# Patient Record
Sex: Female | Born: 1940
Health system: Southern US, Community
[De-identification: ages and names within clinical notes are randomized; demographics above are authoritative.]

## PROBLEM LIST (undated history)

## (undated) DIAGNOSIS — J449 Chronic obstructive pulmonary disease, unspecified: Secondary | ICD-10-CM

## (undated) DIAGNOSIS — E039 Hypothyroidism, unspecified: Secondary | ICD-10-CM

## (undated) DIAGNOSIS — E785 Hyperlipidemia, unspecified: Secondary | ICD-10-CM

## (undated) DIAGNOSIS — I639 Cerebral infarction, unspecified: Secondary | ICD-10-CM

## (undated) DIAGNOSIS — K219 Gastro-esophageal reflux disease without esophagitis: Secondary | ICD-10-CM

## (undated) DIAGNOSIS — C4490 Unspecified malignant neoplasm of skin, unspecified: Secondary | ICD-10-CM

## (undated) DIAGNOSIS — M1990 Unspecified osteoarthritis, unspecified site: Secondary | ICD-10-CM

## (undated) DIAGNOSIS — I10 Essential (primary) hypertension: Secondary | ICD-10-CM

## (undated) DIAGNOSIS — G4700 Insomnia, unspecified: Secondary | ICD-10-CM

## (undated) DIAGNOSIS — F419 Anxiety disorder, unspecified: Secondary | ICD-10-CM

## (undated) DIAGNOSIS — F439 Reaction to severe stress, unspecified: Secondary | ICD-10-CM

## (undated) DIAGNOSIS — R296 Repeated falls: Secondary | ICD-10-CM

## (undated) DIAGNOSIS — IMO0001 Reserved for inherently not codable concepts without codable children: Secondary | ICD-10-CM

## (undated) HISTORY — DX: Gastro-esophageal reflux disease without esophagitis: K21.9

## (undated) HISTORY — PX: TOE SURGERY: SHX1073

## (undated) HISTORY — PX: COLONOSCOPY: SHX174

## (undated) HISTORY — DX: Cerebral infarction, unspecified: I63.9

## (undated) HISTORY — DX: Unspecified osteoarthritis, unspecified site: M19.90

## (undated) HISTORY — DX: Insomnia, unspecified: G47.00

## (undated) HISTORY — DX: Anxiety disorder, unspecified: F41.9

## (undated) HISTORY — DX: Hyperlipidemia, unspecified: E78.5

## (undated) HISTORY — PX: MANDIBLE FRACTURE SURGERY: SHX706

## (undated) HISTORY — DX: Chronic obstructive pulmonary disease, unspecified: J44.9

## (undated) HISTORY — DX: Reserved for inherently not codable concepts without codable children: IMO0001

## (undated) HISTORY — DX: Essential (primary) hypertension: I10

## (undated) HISTORY — DX: Repeated falls: R29.6

## (undated) HISTORY — DX: Reaction to severe stress, unspecified: F43.9

## (undated) HISTORY — DX: Unspecified malignant neoplasm of skin, unspecified: C44.90

## (undated) HISTORY — PX: SKIN CANCER EXCISION: SHX779

## (undated) HISTORY — DX: Hypothyroidism, unspecified: E03.9

---

## 2010-09-26 ENCOUNTER — Encounter: Admission: RE | Admit: 2010-09-26 | Discharge: 2010-09-26 | Payer: Self-pay | Admitting: Internal Medicine

## 2010-11-15 ENCOUNTER — Emergency Department (HOSPITAL_COMMUNITY): Admission: EM | Admit: 2010-11-15 | Discharge: 2010-11-16 | Payer: Self-pay | Admitting: Emergency Medicine

## 2010-12-05 ENCOUNTER — Ambulatory Visit (HOSPITAL_COMMUNITY)
Admission: RE | Admit: 2010-12-05 | Discharge: 2010-12-05 | Payer: Self-pay | Source: Home / Self Care | Admitting: Psychiatry

## 2010-12-10 ENCOUNTER — Other Ambulatory Visit (HOSPITAL_COMMUNITY)
Admission: RE | Admit: 2010-12-10 | Discharge: 2011-01-10 | Payer: Self-pay | Source: Home / Self Care | Attending: Psychiatry | Admitting: Psychiatry

## 2011-03-11 LAB — URINE DRUGS OF ABUSE SCREEN W ALC, ROUTINE (REF LAB)
Amphetamine Screen, Ur: NEGATIVE
Barbiturate Quant, Ur: NEGATIVE
Cocaine Metabolites: NEGATIVE
Creatinine,U: 110.2 mg/dL
Ethyl Alcohol: <10 mg/dL (ref <10)
Methadone: NEGATIVE
Phencyclidine (PCP): NEGATIVE

## 2011-03-12 LAB — URINE DRUGS OF ABUSE SCREEN W ALC, ROUTINE (REF LAB)
Amphetamine Screen, Ur: NEGATIVE
Cocaine Metabolites: NEGATIVE
Creatinine,U: 134.1 mg/dL
Ethyl Alcohol: <10 mg/dL (ref <10)
Methadone: NEGATIVE
Phencyclidine (PCP): NEGATIVE

## 2011-03-25 ENCOUNTER — Other Ambulatory Visit: Payer: Self-pay | Admitting: Internal Medicine

## 2011-03-25 DIAGNOSIS — Z09 Encounter for follow-up examination after completed treatment for conditions other than malignant neoplasm: Secondary | ICD-10-CM

## 2011-04-10 ENCOUNTER — Ambulatory Visit
Admission: RE | Admit: 2011-04-10 | Discharge: 2011-04-10 | Disposition: A | Discharge status: Home / Self Care | Payer: Medicare Other | Source: Ambulatory Visit | Attending: Internal Medicine | Admitting: Internal Medicine

## 2011-04-10 DIAGNOSIS — Z09 Encounter for follow-up examination after completed treatment for conditions other than malignant neoplasm: Secondary | ICD-10-CM

## 2011-08-01 ENCOUNTER — Other Ambulatory Visit: Payer: Self-pay | Admitting: Internal Medicine

## 2011-08-01 DIAGNOSIS — K219 Gastro-esophageal reflux disease without esophagitis: Secondary | ICD-10-CM

## 2011-08-05 ENCOUNTER — Ambulatory Visit
Admission: RE | Admit: 2011-08-05 | Discharge: 2011-08-05 | Disposition: A | Discharge status: Home / Self Care | Payer: Medicare Other | Source: Ambulatory Visit | Attending: Internal Medicine | Admitting: Internal Medicine

## 2011-08-05 DIAGNOSIS — K219 Gastro-esophageal reflux disease without esophagitis: Secondary | ICD-10-CM

## 2011-11-14 ENCOUNTER — Other Ambulatory Visit: Payer: Self-pay | Admitting: Internal Medicine

## 2011-11-14 DIAGNOSIS — N63 Unspecified lump in unspecified breast: Secondary | ICD-10-CM

## 2013-04-20 ENCOUNTER — Other Ambulatory Visit: Payer: Self-pay | Admitting: Internal Medicine

## 2013-04-20 DIAGNOSIS — N63 Unspecified lump in unspecified breast: Secondary | ICD-10-CM

## 2013-04-20 DIAGNOSIS — E2839 Other primary ovarian failure: Secondary | ICD-10-CM

## 2013-05-03 ENCOUNTER — Other Ambulatory Visit: Payer: Self-pay | Admitting: Gastroenterology

## 2013-05-03 DIAGNOSIS — R1310 Dysphagia, unspecified: Secondary | ICD-10-CM

## 2013-05-13 ENCOUNTER — Other Ambulatory Visit: Payer: Medicare Other

## 2013-05-18 ENCOUNTER — Ambulatory Visit
Admission: RE | Admit: 2013-05-18 | Discharge: 2013-05-18 | Disposition: A | Discharge status: Home / Self Care | Payer: Medicare Other | Source: Ambulatory Visit | Attending: Gastroenterology | Admitting: Gastroenterology

## 2013-05-18 DIAGNOSIS — R1310 Dysphagia, unspecified: Secondary | ICD-10-CM

## 2014-01-11 DIAGNOSIS — Z9109 Other allergy status, other than to drugs and biological substances: Secondary | ICD-10-CM | POA: Insufficient documentation

## 2015-01-20 DIAGNOSIS — G4700 Insomnia, unspecified: Secondary | ICD-10-CM | POA: Diagnosis not present

## 2015-01-20 DIAGNOSIS — R079 Chest pain, unspecified: Secondary | ICD-10-CM | POA: Diagnosis not present

## 2015-01-20 DIAGNOSIS — J441 Chronic obstructive pulmonary disease with (acute) exacerbation: Secondary | ICD-10-CM | POA: Diagnosis not present

## 2015-01-20 DIAGNOSIS — R05 Cough: Secondary | ICD-10-CM | POA: Diagnosis not present

## 2015-01-20 DIAGNOSIS — E039 Hypothyroidism, unspecified: Secondary | ICD-10-CM | POA: Diagnosis not present

## 2015-01-20 DIAGNOSIS — K449 Diaphragmatic hernia without obstruction or gangrene: Secondary | ICD-10-CM | POA: Diagnosis not present

## 2015-01-20 DIAGNOSIS — E785 Hyperlipidemia, unspecified: Secondary | ICD-10-CM | POA: Diagnosis not present

## 2015-01-20 DIAGNOSIS — M153 Secondary multiple arthritis: Secondary | ICD-10-CM | POA: Diagnosis not present

## 2015-01-20 DIAGNOSIS — F419 Anxiety disorder, unspecified: Secondary | ICD-10-CM | POA: Diagnosis not present

## 2015-01-20 DIAGNOSIS — K219 Gastro-esophageal reflux disease without esophagitis: Secondary | ICD-10-CM | POA: Diagnosis not present

## 2015-01-20 DIAGNOSIS — I10 Essential (primary) hypertension: Secondary | ICD-10-CM | POA: Diagnosis not present

## 2015-04-18 DIAGNOSIS — J018 Other acute sinusitis: Secondary | ICD-10-CM | POA: Diagnosis not present

## 2015-04-18 DIAGNOSIS — J302 Other seasonal allergic rhinitis: Secondary | ICD-10-CM | POA: Diagnosis not present

## 2015-08-10 DIAGNOSIS — E785 Hyperlipidemia, unspecified: Secondary | ICD-10-CM | POA: Diagnosis not present

## 2015-08-10 DIAGNOSIS — M1990 Unspecified osteoarthritis, unspecified site: Secondary | ICD-10-CM | POA: Diagnosis not present

## 2015-08-10 DIAGNOSIS — I10 Essential (primary) hypertension: Secondary | ICD-10-CM | POA: Diagnosis not present

## 2015-08-10 DIAGNOSIS — M797 Fibromyalgia: Secondary | ICD-10-CM | POA: Diagnosis not present

## 2015-08-10 DIAGNOSIS — E039 Hypothyroidism, unspecified: Secondary | ICD-10-CM | POA: Diagnosis not present

## 2015-08-10 DIAGNOSIS — M5412 Radiculopathy, cervical region: Secondary | ICD-10-CM | POA: Diagnosis not present

## 2015-08-10 DIAGNOSIS — R42 Dizziness and giddiness: Secondary | ICD-10-CM | POA: Diagnosis not present

## 2015-08-17 DIAGNOSIS — R2681 Unsteadiness on feet: Secondary | ICD-10-CM | POA: Diagnosis not present

## 2015-08-17 DIAGNOSIS — R42 Dizziness and giddiness: Secondary | ICD-10-CM | POA: Diagnosis not present

## 2015-08-17 DIAGNOSIS — R269 Unspecified abnormalities of gait and mobility: Secondary | ICD-10-CM | POA: Diagnosis not present

## 2015-08-31 DIAGNOSIS — I6523 Occlusion and stenosis of bilateral carotid arteries: Secondary | ICD-10-CM | POA: Diagnosis not present

## 2015-08-31 DIAGNOSIS — R2681 Unsteadiness on feet: Secondary | ICD-10-CM | POA: Diagnosis not present

## 2015-08-31 DIAGNOSIS — R42 Dizziness and giddiness: Secondary | ICD-10-CM | POA: Diagnosis not present

## 2015-10-21 DIAGNOSIS — Z23 Encounter for immunization: Secondary | ICD-10-CM | POA: Diagnosis not present

## 2015-11-16 DIAGNOSIS — F3289 Other specified depressive episodes: Secondary | ICD-10-CM | POA: Diagnosis not present

## 2015-11-16 DIAGNOSIS — M5412 Radiculopathy, cervical region: Secondary | ICD-10-CM | POA: Diagnosis not present

## 2015-11-16 DIAGNOSIS — K219 Gastro-esophageal reflux disease without esophagitis: Secondary | ICD-10-CM | POA: Diagnosis not present

## 2015-11-16 DIAGNOSIS — M1990 Unspecified osteoarthritis, unspecified site: Secondary | ICD-10-CM | POA: Diagnosis not present

## 2015-11-16 DIAGNOSIS — J209 Acute bronchitis, unspecified: Secondary | ICD-10-CM | POA: Diagnosis not present

## 2015-11-16 DIAGNOSIS — I10 Essential (primary) hypertension: Secondary | ICD-10-CM | POA: Diagnosis not present

## 2015-11-16 DIAGNOSIS — L299 Pruritus, unspecified: Secondary | ICD-10-CM | POA: Diagnosis not present

## 2015-11-16 DIAGNOSIS — M797 Fibromyalgia: Secondary | ICD-10-CM | POA: Diagnosis not present

## 2015-11-16 DIAGNOSIS — F419 Anxiety disorder, unspecified: Secondary | ICD-10-CM | POA: Diagnosis not present

## 2016-01-01 DIAGNOSIS — I6523 Occlusion and stenosis of bilateral carotid arteries: Secondary | ICD-10-CM | POA: Diagnosis not present

## 2016-01-01 DIAGNOSIS — M6281 Muscle weakness (generalized): Secondary | ICD-10-CM | POA: Diagnosis not present

## 2016-01-01 DIAGNOSIS — Z89421 Acquired absence of other right toe(s): Secondary | ICD-10-CM | POA: Diagnosis not present

## 2016-01-01 DIAGNOSIS — G4700 Insomnia, unspecified: Secondary | ICD-10-CM | POA: Diagnosis not present

## 2016-01-01 DIAGNOSIS — I119 Hypertensive heart disease without heart failure: Secondary | ICD-10-CM | POA: Diagnosis not present

## 2016-01-01 DIAGNOSIS — S42191S Fracture of other part of scapula, right shoulder, sequela: Secondary | ICD-10-CM | POA: Diagnosis not present

## 2016-01-01 DIAGNOSIS — E039 Hypothyroidism, unspecified: Secondary | ICD-10-CM | POA: Diagnosis not present

## 2016-01-01 DIAGNOSIS — I63511 Cerebral infarction due to unspecified occlusion or stenosis of right middle cerebral artery: Secondary | ICD-10-CM | POA: Diagnosis not present

## 2016-01-01 DIAGNOSIS — R41841 Cognitive communication deficit: Secondary | ICD-10-CM | POA: Diagnosis not present

## 2016-01-01 DIAGNOSIS — R296 Repeated falls: Secondary | ICD-10-CM | POA: Insufficient documentation

## 2016-01-01 DIAGNOSIS — E785 Hyperlipidemia, unspecified: Secondary | ICD-10-CM | POA: Diagnosis not present

## 2016-01-01 DIAGNOSIS — J449 Chronic obstructive pulmonary disease, unspecified: Secondary | ICD-10-CM | POA: Diagnosis present

## 2016-01-01 DIAGNOSIS — K219 Gastro-esophageal reflux disease without esophagitis: Secondary | ICD-10-CM | POA: Diagnosis not present

## 2016-01-01 DIAGNOSIS — M25511 Pain in right shoulder: Secondary | ICD-10-CM | POA: Diagnosis not present

## 2016-01-01 DIAGNOSIS — I081 Rheumatic disorders of both mitral and tricuspid valves: Secondary | ICD-10-CM | POA: Diagnosis not present

## 2016-01-01 DIAGNOSIS — R262 Difficulty in walking, not elsewhere classified: Secondary | ICD-10-CM | POA: Diagnosis not present

## 2016-01-01 DIAGNOSIS — R278 Other lack of coordination: Secondary | ICD-10-CM | POA: Diagnosis not present

## 2016-01-01 DIAGNOSIS — F17210 Nicotine dependence, cigarettes, uncomplicated: Secondary | ICD-10-CM | POA: Diagnosis present

## 2016-01-01 DIAGNOSIS — Z85828 Personal history of other malignant neoplasm of skin: Secondary | ICD-10-CM | POA: Diagnosis not present

## 2016-01-01 DIAGNOSIS — M62838 Other muscle spasm: Secondary | ICD-10-CM | POA: Diagnosis not present

## 2016-01-01 DIAGNOSIS — I10 Essential (primary) hypertension: Secondary | ICD-10-CM | POA: Diagnosis not present

## 2016-01-01 DIAGNOSIS — R531 Weakness: Secondary | ICD-10-CM | POA: Diagnosis not present

## 2016-01-01 DIAGNOSIS — S199XXA Unspecified injury of neck, initial encounter: Secondary | ICD-10-CM | POA: Diagnosis not present

## 2016-01-01 DIAGNOSIS — G8194 Hemiplegia, unspecified affecting left nondominant side: Secondary | ICD-10-CM | POA: Diagnosis present

## 2016-01-01 DIAGNOSIS — M1990 Unspecified osteoarthritis, unspecified site: Secondary | ICD-10-CM | POA: Diagnosis present

## 2016-01-01 DIAGNOSIS — I639 Cerebral infarction, unspecified: Secondary | ICD-10-CM | POA: Diagnosis not present

## 2016-01-01 DIAGNOSIS — R29810 Facial weakness: Secondary | ICD-10-CM | POA: Diagnosis present

## 2016-01-01 LAB — BASIC METABOLIC PANEL
BUN: 14 mg/dL (ref 4–21)
CREATININE: 0.7 mg/dL (ref 0.5–1.1)
Glucose: 157 mg/dL
Potassium: 3.6 mmol/L (ref 3.4–5.3)
SODIUM: 138 mmol/L (ref 137–147)

## 2016-01-01 LAB — CBC AND DIFFERENTIAL
HCT: 42 % (ref 36–46)
Hemoglobin: 13.6 g/dL (ref 12.0–16.0)
Platelets: 202 10*3/uL (ref 150–399)
WBC: 9.3 10^3/mL

## 2016-01-02 LAB — HEPATIC FUNCTION PANEL
ALK PHOS: 91 U/L (ref 25–125)
ALT: 9 U/L (ref 7–35)
AST: 16 U/L (ref 13–35)
Bilirubin, Total: 0.6 mg/dL

## 2016-01-02 LAB — CBC AND DIFFERENTIAL
HCT: 43 % (ref 36–46)
HEMOGLOBIN: 13.4 g/dL (ref 12.0–16.0)
PLATELETS: 195 10*3/uL (ref 150–399)
WBC: 7.4 10*3/mL

## 2016-01-02 LAB — BASIC METABOLIC PANEL
BUN: 12 mg/dL (ref 4–21)
CREATININE: 0.6 mg/dL (ref 0.5–1.1)
GLUCOSE: 108 mg/dL
POTASSIUM: 3.3 mmol/L — AB (ref 3.4–5.3)
Sodium: 139 mmol/L (ref 137–147)

## 2016-01-02 LAB — LIPID PANEL
Cholesterol: 279 mg/dL — AB (ref 0–200)
HDL: 49 mg/dL (ref 35–70)
LDL Cholesterol: 201 mg/dL
Triglycerides: 149 mg/dL (ref 40–160)

## 2016-01-02 LAB — HEMOGLOBIN A1C: HEMOGLOBIN A1C: 5.1

## 2016-01-04 DIAGNOSIS — I10 Essential (primary) hypertension: Secondary | ICD-10-CM | POA: Diagnosis not present

## 2016-01-04 DIAGNOSIS — K219 Gastro-esophageal reflux disease without esophagitis: Secondary | ICD-10-CM | POA: Diagnosis not present

## 2016-01-04 DIAGNOSIS — R262 Difficulty in walking, not elsewhere classified: Secondary | ICD-10-CM | POA: Diagnosis not present

## 2016-01-04 DIAGNOSIS — I081 Rheumatic disorders of both mitral and tricuspid valves: Secondary | ICD-10-CM | POA: Diagnosis not present

## 2016-01-04 DIAGNOSIS — R296 Repeated falls: Secondary | ICD-10-CM | POA: Diagnosis not present

## 2016-01-04 DIAGNOSIS — M25511 Pain in right shoulder: Secondary | ICD-10-CM | POA: Diagnosis not present

## 2016-01-04 DIAGNOSIS — E039 Hypothyroidism, unspecified: Secondary | ICD-10-CM | POA: Diagnosis not present

## 2016-01-04 DIAGNOSIS — R278 Other lack of coordination: Secondary | ICD-10-CM | POA: Diagnosis not present

## 2016-01-04 DIAGNOSIS — E038 Other specified hypothyroidism: Secondary | ICD-10-CM | POA: Diagnosis not present

## 2016-01-04 DIAGNOSIS — I639 Cerebral infarction, unspecified: Secondary | ICD-10-CM | POA: Diagnosis not present

## 2016-01-04 DIAGNOSIS — R41841 Cognitive communication deficit: Secondary | ICD-10-CM | POA: Diagnosis not present

## 2016-01-04 DIAGNOSIS — R5381 Other malaise: Secondary | ICD-10-CM | POA: Diagnosis not present

## 2016-01-04 DIAGNOSIS — E785 Hyperlipidemia, unspecified: Secondary | ICD-10-CM | POA: Diagnosis not present

## 2016-01-04 DIAGNOSIS — M542 Cervicalgia: Secondary | ICD-10-CM | POA: Diagnosis not present

## 2016-01-04 DIAGNOSIS — K122 Cellulitis and abscess of mouth: Secondary | ICD-10-CM | POA: Diagnosis not present

## 2016-01-04 DIAGNOSIS — I6990 Unspecified sequelae of unspecified cerebrovascular disease: Secondary | ICD-10-CM | POA: Diagnosis not present

## 2016-01-04 DIAGNOSIS — F329 Major depressive disorder, single episode, unspecified: Secondary | ICD-10-CM | POA: Diagnosis not present

## 2016-01-04 DIAGNOSIS — R413 Other amnesia: Secondary | ICD-10-CM | POA: Diagnosis not present

## 2016-01-04 DIAGNOSIS — M62838 Other muscle spasm: Secondary | ICD-10-CM | POA: Diagnosis not present

## 2016-01-04 DIAGNOSIS — R269 Unspecified abnormalities of gait and mobility: Secondary | ICD-10-CM | POA: Diagnosis not present

## 2016-01-04 DIAGNOSIS — M6281 Muscle weakness (generalized): Secondary | ICD-10-CM | POA: Diagnosis not present

## 2016-01-04 DIAGNOSIS — I63511 Cerebral infarction due to unspecified occlusion or stenosis of right middle cerebral artery: Secondary | ICD-10-CM | POA: Diagnosis not present

## 2016-01-09 ENCOUNTER — Encounter: Payer: Self-pay | Admitting: Internal Medicine

## 2016-01-09 ENCOUNTER — Non-Acute Institutional Stay (SKILLED_NURSING_FACILITY): Payer: Medicare Other | Admitting: Internal Medicine

## 2016-01-09 DIAGNOSIS — E038 Other specified hypothyroidism: Secondary | ICD-10-CM

## 2016-01-09 DIAGNOSIS — M542 Cervicalgia: Secondary | ICD-10-CM | POA: Diagnosis not present

## 2016-01-09 DIAGNOSIS — F329 Major depressive disorder, single episode, unspecified: Secondary | ICD-10-CM | POA: Diagnosis not present

## 2016-01-09 DIAGNOSIS — K219 Gastro-esophageal reflux disease without esophagitis: Secondary | ICD-10-CM | POA: Diagnosis not present

## 2016-01-09 DIAGNOSIS — E785 Hyperlipidemia, unspecified: Secondary | ICD-10-CM | POA: Diagnosis not present

## 2016-01-09 DIAGNOSIS — I63511 Cerebral infarction due to unspecified occlusion or stenosis of right middle cerebral artery: Secondary | ICD-10-CM | POA: Diagnosis not present

## 2016-01-09 DIAGNOSIS — I10 Essential (primary) hypertension: Secondary | ICD-10-CM | POA: Diagnosis not present

## 2016-01-09 DIAGNOSIS — R5381 Other malaise: Secondary | ICD-10-CM | POA: Diagnosis not present

## 2016-01-09 NOTE — Progress Notes (Signed)
Patient ID: Kendra Hernandez, female   DOB: 04/25/1941, 75 y.o.   MRN: [id number]     [facility]  PCP: [name], MD  Code Status: Full Code   Allergies  Allergen Reactions  . Ceftin [Cefuroxime Axetil]   . Penicillins Hives    Hives and swelling as a child   . Simvastatin     Patient can not recall side effect     Chief Complaint  Patient presents with  . New Admit To SNF    New admission      HPI:  75 y.o. patient is here for short term rehabilitation post hospital admission from 01/01/16-01/04/16 with right MCA stroke with left sided weakness. She has PMH of HTN, HLD, hypothyroidism, insomnia among others. She is seen in her room today. She complaints of pain in her right lower neck and right shoulder area. No falls in the facility. She woke up with some scratchy throat this am. Denies any cough or runny nose. She gets tired easily and has low energy level. No other concerns.   Review of Systems:  Constitutional: Negative for fever, chills, diaphoresis.  HENT: Negative for headache, congestion, nasal discharge Eyes: Negative for blurred vision, double vision and discharge.  Respiratory: Negative for cough, shortness of breath and wheezing.   Cardiovascular: Negative for chest pain, palpitations, leg swelling.  Gastrointestinal: Negative for heartburn, nausea, vomiting, abdominal pain. Had bowel movement yesterday Genitourinary: Negative for dysuria and flank pain.  Musculoskeletal: Negative for back pain, falls Skin: Negative for itching, rash.  Neurological: Negative for dizziness, tingling Psychiatric/Behavioral: Negative for depression   Past Medical History  Diagnosis Date  . Stroke (cerebrum) ([place])   . Repeated falls   . Hypertension   . Hyperlipidemia   . Insomnia   . Hypothyroid   . Reflux   . Anxiety   . Stress   . Skin cancer   . Arthritis   . COPD (chronic obstructive pulmonary disease) [facility])    Past Surgical History  Procedure  Laterality Date  . Toe surgery Right     Removal of toe   . Mandible fracture surgery      Placement of partial jaw due to abcess  . Skin cancer excision      Removal of several skin cancers  . Colonoscopy      With polyp removal    Social History:   reports that she has been smoking.  She does not have any smokeless tobacco history on file. She reports that she drinks alcohol. She reports that she does not use illicit drugs.  Family History  Problem Relation Age of Onset  . Emphysema Brother   . Emphysema Mother   . Heart attack Father   . Rheumatologic disease Mother     Medications:   Medication List       This list is accurate as of: 01/09/16 11:07 AM.  Always use your most recent med list.               aspirin EC 81 MG tablet  Take 81 mg by mouth daily. With breakfast     atorvastatin 40 MG tablet  Commonly known as:  LIPITOR  Take 40 mg by mouth daily.     baclofen 10 MG tablet  Commonly known as:  LIORESAL  Take 10 mg by mouth 3 (three) times daily. Hold for sedation     fluticasone 50 MCG/ACT nasal spray  Commonly known as:  FLONASE  Place 2 sprays into both nostrils daily.     hydrALAZINE 100 MG tablet  Commonly known as:  APRESOLINE  Take 100 mg by mouth 2 (two) times daily. Hold for SBP <110     hydrOXYzine 25 MG tablet  Commonly known as:  ATARAX/VISTARIL  Take 25 mg by mouth every 8 (eight) hours as needed for anxiety.     labetalol 200 MG tablet  Commonly known as:  NORMODYNE  Take 200 mg by mouth 2 (two) times daily.     levothyroxine 50 MCG tablet  Commonly known as:  SYNTHROID, LEVOTHROID  Take 50 mcg by mouth daily before breakfast.     nicotine 14 mg/24hr patch  Commonly known as:  NICODERM CQ - dosed in mg/24 hours  Place 14 mg onto the skin daily.     omeprazole 40 MG capsule  Commonly known as:  PRILOSEC  Take 40 mg by mouth daily.     sucralfate 1 g tablet  Commonly known as:  CARAFATE  Take 1 g by mouth 4 (four) times  daily -  with meals and at bedtime.     traMADol 50 MG tablet  Commonly known as:  ULTRAM  Take 50 mg by mouth every 4 (four) hours as needed (Hold for sedation).     traZODone 100 MG tablet  Commonly known as:  DESYREL  2 by mouth every evening     TYLENOL ARTHRITIS PAIN 650 MG CR tablet  Generic drug:  acetaminophen  Take 650 mg by mouth every 8 (eight) hours as needed for pain.     venlafaxine 100 MG tablet  Commonly known as:  EFFEXOR  Take 100 mg by mouth 2 (two) times daily.         Physical Exam: Filed Vitals:   01/09/16 1035  BP: 134/66  Pulse: 61  Temp: 97.1 F (36.2 C)  TempSrc: Oral  Resp: 18  SpO2: 99%    General- elderly female, thin built, in no acute distress Head- normocephalic, atraumatic Nose- no maxillary or frontal sinus tenderness, no nasal discharge Throat- moist mucus membrane Eyes- PERRLA, EOMI, no pallor, no icterus, no discharge, normal conjunctiva, normal sclera Neck- no cervical lymphadenopathy Cardiovascular- normal s1,s2, palpable dorsalis pedis and radial pulses, no leg edema Respiratory- bilateral clear to auscultation, no wheeze, no rhonchi, no crackles, no use of accessory muscles Abdomen- bowel sounds present, soft, non tender Musculoskeletal- able to move all 4 extremities, generalized weakness left > right, no spinal tenderness Neurological- alert and oriented  Skin- warm and dry Psychiatry- normal mood and affect    Labs reviewed: Basic Metabolic Panel:  Recent Labs  01/01/16 01/02/16  NA 138 139  K 3.6 3.3*  BUN 14 12  CREATININE 0.7 0.6   Liver Function Tests:  Recent Labs  01/02/16  AST 16  ALT 9  ALKPHOS 91   No results for input(s): LIPASE, AMYLASE in the last 8760 hours. No results for input(s): AMMONIA in the last 8760 hours. CBC:  Recent Labs  01/01/16 01/02/16  WBC 9.3 7.4  HGB 13.6 13.4  HCT 42 43  PLT 202 195     Assessment/Plan  Physical deconditioning Post CVA. Will have her work  with physical therapy and occupational therapy team to help with gait training and muscle strengthening exercises.fall precautions. Skin care. Encourage to be out of bed.   Right MCA stroke Continue aspirin ec 81 mg daily with lipitor 40 mg daily for now. Will have patient work with  PT/OT as tolerated to regain strength and restore function.  Fall precautions are in place. Will need f/u with neurology in 4-6 weeks, will get neurology appointment  HTN Stable, continue hydralazine 100 mg bid, labetalol 200 mg bid and monitor BP bid with holding parameters, check cmp  Cervicalgia Currently on tramadol 50 mg q4h prn with baclofen 10 mg tid. Change tramadol to 50 mg 1-2 tab q6h prn pain and start lidocaine patch to the neck area and monitor  HLD Continue lipitor 40 mg daily for now and monitor lipid panel in 1 month  Hypothyroidism Continue synthroid 50 mcg daily, check tsh  gerd Stable, continue prilosec 40 mg daily with carafate 1 g qid and monitor, check cbc  Chronic depression Stable mood, continue effexor 100 mg bid and trazodone 200 mg qhs, monitor clinically     Goals of care: short term rehabilitation   Labs/tests ordered: cbc, cmp, tsh 01/10/16, neurology f/u and lipid panel in 4 weeks  Family/ staff Communication: reviewed care plan with patient and nursing supervisor    Blanchie Serve, MD  [facility] [phone] (Monday-Friday 8 am - 5 pm) [phone] (afterhours)

## 2016-01-24 ENCOUNTER — Encounter: Payer: Self-pay | Admitting: Neurology

## 2016-01-24 ENCOUNTER — Ambulatory Visit (INDEPENDENT_AMBULATORY_CARE_PROVIDER_SITE_OTHER): Payer: Medicare Other | Admitting: Neurology

## 2016-01-24 VITALS — BP 176/88 | HR 60 | Ht 63.0 in | Wt 129.5 lb

## 2016-01-24 DIAGNOSIS — F329 Major depressive disorder, single episode, unspecified: Secondary | ICD-10-CM | POA: Diagnosis not present

## 2016-01-24 DIAGNOSIS — I63511 Cerebral infarction due to unspecified occlusion or stenosis of right middle cerebral artery: Secondary | ICD-10-CM | POA: Diagnosis not present

## 2016-01-24 DIAGNOSIS — R413 Other amnesia: Secondary | ICD-10-CM | POA: Diagnosis not present

## 2016-01-24 DIAGNOSIS — R269 Unspecified abnormalities of gait and mobility: Secondary | ICD-10-CM | POA: Diagnosis not present

## 2016-01-24 NOTE — Progress Notes (Signed)
PATIENT: Kendra Hernandez DOB: [birth year]  Chief Complaint  Patient presents with  . Cerebrovascular Accident    MMSE 28/30 - 17 animals.  She is here with her son, Kendra Hernandez.  She suffered a stroke in January 2017.  She is currently in rehab at [facility].  She is involved in PT, OT and speech therapy.  She is still having cognitive problems and left-sided weakness.  They have a goal of her being able to return to her apartment and live independently.     HISTORICAL  Kendra Hernandez a 75 years old right-handed female,accompanied by her son Kendra Hernandez, seen in refer by Dr. Delfina Redwood for valuation of stroke   I reviewed and summarized per hospital record from [facility],   She had past medical history of hypertension, anxiety, hyperlipidemia, hypothyroidism, COPD, used to be a heavy smoker, and heavy drinker, was admitted to local hospital in January 01 2016 for acute onset of weakness, falling which happened in December 30 first 2016. patient is a poor historian, also hard of hearing,,   She lives alone at [facility], probably related to the stroke, she smoke at least 1 pack daily, independent of living, still drives, no significant memory trouble, since stroke, she was noted to have increased memory trouble, tends to repeat herself, she continue has mild left-sided weakness, she had a gradual onset gait difficulty for many years, acute worsening since her stroke, she denies bowel and bladder incontinence, she currently lives at [facility], will be discharged to her own apartment soon.    I have reviewed MRI of the brain, showed acute right MCA infarction, per description, involving right parietal, temporal, insular, mild supratentorium small vessel disease   Laboratory evaluation showed normal CBC with hemoglobin of 13 point 6, normal CMP, with creatinine of 0.6 2, GFR more than 90, lipid profile showed elevated cholesterol 279, LDL  201,  Ultrasound of carotid artery showed no evidence of large vessel bilateral internal carotid stenosis  She has a history of chronic neck pain, diffuse body achy pain, fibromyalgia, has a tendency to over medicate herself with pain medications.    REVIEW OF SYSTEMS: Full 14 system review of systems performed and notable only forheadache, slurred speech, difficulty swallowing  depression anxiety, memory loss, confusion,   ALLERGIES: Allergies  Allergen Reactions  . Ceftin [Cefuroxime Axetil]   . Penicillins Hives    Hives and swelling as a child   . Simvastatin     Patient can not recall side effect     HOME MEDICATIONS: Current Outpatient Prescriptions  Medication Sig Dispense Refill  . acetaminophen (TYLENOL ARTHRITIS PAIN) 650 MG CR tablet Take 650 mg by mouth every 8 (eight) hours as needed for pain.    Marland Kitchen aspirin EC 81 MG tablet Take 81 mg by mouth daily. With breakfast    . atorvastatin (LIPITOR) 40 MG tablet Take 40 mg by mouth daily.    . baclofen (LIORESAL) 10 MG tablet Take 10 mg by mouth 3 (three) times daily. Hold for sedation    . fluticasone (FLONASE) 50 MCG/ACT nasal spray Place 2 sprays into both nostrils daily.    . hydrALAZINE (APRESOLINE) 100 MG tablet Take 100 mg by mouth 2 (two) times daily. Hold for SBP <110    . hydrOXYzine (ATARAX/VISTARIL) 25 MG tablet Take 25 mg by mouth every 8 (eight) hours as needed for anxiety.    Marland Kitchen labetalol (NORMODYNE) 200 MG tablet Take 200 mg  by mouth 2 (two) times daily.    Marland Kitchen levothyroxine (SYNTHROID, LEVOTHROID) 50 MCG tablet Take 50 mcg by mouth daily before breakfast.    . nicotine (NICODERM CQ - DOSED IN MG/24 HOURS) 14 mg/24hr patch Place 14 mg onto the skin daily.    Marland Kitchen omeprazole (PRILOSEC) 40 MG capsule Take 40 mg by mouth daily.    . sucralfate (CARAFATE) 1 g tablet Take 1 g by mouth 4 (four) times daily -  with meals and at bedtime.    . traMADol (ULTRAM) 50 MG tablet Take 50 mg by mouth every 4 (four) hours as needed  (Hold for sedation).    . traZODone (DESYREL) 100 MG tablet 2 by mouth every evening    . venlafaxine (EFFEXOR) 100 MG tablet Take 100 mg by mouth 2 (two) times daily.     No current facility-administered medications for this visit.    PAST MEDICAL HISTORY: Past Medical History  Diagnosis Date  . Stroke (cerebrum) ([place])   . Repeated falls   . Hypertension   . Hyperlipidemia   . Insomnia   . Hypothyroid   . Reflux   . Anxiety   . Stress   . Skin cancer   . Arthritis   . COPD (chronic obstructive pulmonary disease) ([place])     PAST SURGICAL HISTORY: Past Surgical History  Procedure Laterality Date  . Toe surgery Right     Removal of toe   . Mandible fracture surgery      Placement of partial jaw due to abcess  . Skin cancer excision      Removal of several skin cancers  . Colonoscopy      With polyp removal     FAMILY HISTORY: Family History  Problem Relation Age of Onset  . Emphysema Brother   . Emphysema Mother   . Heart attack Father   . Rheumatologic disease Mother   . Pancreatic cancer Daughter     SOCIAL HISTORY:  Social History   Social History  . Marital Status: Divorced    Spouse Name: N/A  . Number of Children: 2  . Years of Education: 13   Occupational History  . Retired    Social History Main Topics  . Smoking status: Current Every Day Smoker -- 1.00 packs/day for 30 years    Types: Cigarettes  . Smokeless tobacco: Not on file  . Alcohol Use: No  . Drug Use: No  . Sexual Activity: Not on file   Other Topics Concern  . Not on file   Social History Narrative   Right-handed.   Currently in stroke rehab at [facility]     Occasional caffeine use.     PHYSICAL EXAM   Filed Vitals:   01/24/16 1324  BP: 176/88  Pulse: 60  Height: 5\' 3"  (1.6 m)  Weight: 129 lb 8 oz (58.741 kg)    Not recorded      Body mass index is 22.95 kg/(m^2).  PHYSICAL EXAMNIATION:  Gen: NAD, conversant, well nourised, obese, well groomed                      Cardiovascular: Regular rate rhythm, no peripheral edema, warm, nontender. Eyes: Conjunctivae clear without exudates or hemorrhage Neck: Supple, no carotid bruise. Pulmonary: Clear to auscultation bilaterally   NEUROLOGICAL EXAM:  MENTAL STATUS: Speech:    Speech is normal; fluent and spontaneous with normal comprehension.  Cognition: MMSE 28/30, animal naming 16.     Orientation  to time, place and person     recent and remote memory. She missed 2 out of 3 recalls      Normal Attention span and concentration     Normal Language, naming, repeating,spontaneous speech     Fund of knowledge   CRANIAL NERVES: CN II: Visual fields are full to confrontation. Pupils are round equal and briskly reactive to light. CN III, IV, VI: extraocular movement are normal. No ptosis. CN V: Facial sensation is intact to pinprick in all 3 divisions bilaterally. Corneal responses are intact.  CN VII: Face is symmetric with normal eye closure and smile. CN VIII: She is very hard of hearing CN IX, X: Palate elevates symmetrically. Phonation is normal. CN XI: Head turning and shoulder shrug are intact CN XII: Tongue is midline with normal movements and no atrophy.  MOTOR: There is no pronator drift of out-stretched arms. Muscle bulk and tone are normal. she has mild left upper extremity proximal and distal weakness 5 minus   REFLEXES: Reflexes are  3 and  symmetric at the biceps, triceps, knees, and ankles. Plantar responseextensor bilaterally   SENSORY: Intact to light touch, pinprick, position sense, and vibration sense are intact in fingers and toes.  COORDINATION: Rapid alternating movements and fine finger movements are intact. There is no dysmetria on finger-to-nose and heel-knee-shin.    GAIT/STANCE:  she need to push up to get up from seated position, cautious, mildly unsteady stiff gait   DIAGNOSTIC DATA (LABS, IMAGING, TESTING) - I reviewed patient records, labs, notes, testing  and imaging myself where available.   ASSESSMENT AND PLAN  Novia Kania is a 75 y.o. female    Right MCA stroke  Keep current dose of aspirin daily  Continue rehabilitation  Continue to address vascular risk factor, goal blood pressure is less than 130 over 80, LDL less than 100, stop smoking, heavy drinking  Gait abnormality  Multifactorial aging, stroke, deconditioning, need to rule out cervical spondylitic myelopathy  If she still have significant gait difficulty need to proceed with MRI of cervical spine  Memory loss  Mini-Mental Status Examination is 28 out of 30 today  If she continue have memory loss in next follow-up in 6 months, may consider add on Namenda, Aricept  Marcial Pacas, M.D. Ph.D.  [facility] [address] [place], [place] [address] Ph: [phone] Fax: [phone]  CC: Seward Carol, MD

## 2016-01-29 ENCOUNTER — Non-Acute Institutional Stay (SKILLED_NURSING_FACILITY): Payer: Medicare Other | Admitting: Nurse Practitioner

## 2016-01-29 ENCOUNTER — Encounter: Payer: Self-pay | Admitting: Nurse Practitioner

## 2016-01-29 DIAGNOSIS — I6990 Unspecified sequelae of unspecified cerebrovascular disease: Secondary | ICD-10-CM

## 2016-01-29 DIAGNOSIS — F329 Major depressive disorder, single episode, unspecified: Secondary | ICD-10-CM

## 2016-01-29 DIAGNOSIS — E785 Hyperlipidemia, unspecified: Secondary | ICD-10-CM

## 2016-01-29 DIAGNOSIS — R5381 Other malaise: Secondary | ICD-10-CM | POA: Diagnosis not present

## 2016-01-29 DIAGNOSIS — I10 Essential (primary) hypertension: Secondary | ICD-10-CM | POA: Diagnosis not present

## 2016-01-29 DIAGNOSIS — K122 Cellulitis and abscess of mouth: Secondary | ICD-10-CM

## 2016-01-29 DIAGNOSIS — E038 Other specified hypothyroidism: Secondary | ICD-10-CM

## 2016-01-29 DIAGNOSIS — M542 Cervicalgia: Secondary | ICD-10-CM

## 2016-01-29 NOTE — Progress Notes (Signed)
Patient ID: Kendra Hernandez, female   DOB: 05-21-1941, 75 y.o.   MRN: [id number]    Nursing Home Location:  [place] of Service: SNF (31)  PCP: [name], MD  Allergies  Allergen Reactions  . Ceftin [Cefuroxime Axetil]   . Penicillins Hives    Hives and swelling as a child   . Simvastatin     Patient can not recall side effect     Chief Complaint  Patient presents with  . Discharge Note    Discharge from facility    HPI:  Patient is a 75 y.o. female seen today at [facility] for discharge home. She has PMH of HTN, HLD, hypothyroidism, insomnia and depression. Pt at [facility] for short term rehabilitation after hospitalization from 01/01/16-01/04/16 with right MCA stroke with left sided weakness. Pt has been doing well in therapy. Pt being treated for dental abscess with cleocin and has follow up scheduled for the day after discharge.   Review of Systems:  Review of Systems  Constitutional: Negative for activity change, appetite change, fatigue and unexpected weight change.  HENT: Negative for congestion and hearing loss.   Eyes: Negative.   Respiratory: Negative for cough and shortness of breath.   Cardiovascular: Negative for chest pain, palpitations and leg swelling.  Gastrointestinal: Negative for abdominal pain, diarrhea and constipation.  Genitourinary: Negative for dysuria and difficulty urinating.  Musculoskeletal: Negative for myalgias and arthralgias.  Skin: Negative for color change and wound.  Neurological: Positive for weakness (generalized). Negative for dizziness.  Psychiatric/Behavioral: Negative for behavioral problems, confusion and agitation.    Past Medical History  Diagnosis Date  . Stroke (cerebrum) ([place])   . Repeated falls   . Hypertension   . Hyperlipidemia   . Insomnia   . Hypothyroid   . Reflux   . Anxiety   . Stress   . Skin cancer   . Arthritis   . COPD (chronic obstructive pulmonary  disease) [facility])    Past Surgical History  Procedure Laterality Date  . Toe surgery Right     Removal of toe   . Mandible fracture surgery      Placement of partial jaw due to abcess  . Skin cancer excision      Removal of several skin cancers  . Colonoscopy      With polyp removal    Social History:   reports that she has been smoking Cigarettes.  She has a 30 pack-year smoking history. She does not have any smokeless tobacco history on file. She reports that she does not drink alcohol or use illicit drugs.  Family History  Problem Relation Age of Onset  . Emphysema Brother   . Emphysema Mother   . Heart attack Father   . Rheumatologic disease Mother   . Pancreatic cancer Daughter     Medications: Patient's Medications  New Prescriptions   No medications on file  Previous Medications   ACETAMINOPHEN (TYLENOL ARTHRITIS PAIN) 650 MG CR TABLET    Take 650 mg by mouth every 8 (eight) hours as needed for pain.   ASPIRIN EC 81 MG TABLET    Take 81 mg by mouth daily. With breakfast   ATORVASTATIN (LIPITOR) 40 MG TABLET    Take 40 mg by mouth daily.   BACLOFEN (LIORESAL) 10 MG TABLET    Take 10 mg by mouth 3 (three) times daily. Hold for sedation   CLINDAMYCIN (CLEOCIN) 150 MG CAPSULE  1 capsule every 6 hours until current prescription completed. May use own supply.   FLUTICASONE (FLONASE) 50 MCG/ACT NASAL SPRAY    Place 2 sprays into both nostrils daily.   HYDRALAZINE (APRESOLINE) 50 MG TABLET    Take 50 mg by mouth 2 (two) times daily.   HYDROCODONE-ACETAMINOPHEN (NORCO/VICODIN) 5-325 MG TABLET    Take 1 tablet by mouth every 6 (six) hours as needed (May use own supply).   HYDROXYZINE (ATARAX/VISTARIL) 25 MG TABLET    Take 25 mg by mouth every 8 (eight) hours as needed for anxiety.   LABETALOL (NORMODYNE) 200 MG TABLET    Take 200 mg by mouth 2 (two) times daily.   LEVOTHYROXINE (SYNTHROID, LEVOTHROID) 50 MCG TABLET    Take 50 mcg by mouth daily before breakfast.   LIDOCAINE  (LIDODERM) 5 %    Place 1 patch on the right neck area for 12 hours and then remove for 12 hours.   NICOTINE (NICODERM CQ - DOSED IN MG/24 HOURS) 14 MG/24HR PATCH    Place 14 mg onto the skin daily.   OMEPRAZOLE (PRILOSEC) 40 MG CAPSULE    Take 40 mg by mouth daily.   SACCHAROMYCES BOULARDII (FLORASTOR) 250 MG CAPSULE    Take 1 capsule by mouth twice daily for 10 days   SUCRALFATE (CARAFATE) 1 G TABLET    Take 1 g by mouth 4 (four) times daily -  with meals and at bedtime.   TRAMADOL (ULTRAM) 50 MG TABLET    Take 50 mg by mouth every 4 (four) hours as needed (Hold for sedation).   TRAZODONE (DESYREL) 100 MG TABLET    2 by mouth every evening   VENLAFAXINE (EFFEXOR) 100 MG TABLET    Take 100 mg by mouth 2 (two) times daily.  Modified Medications   No medications on file  Discontinued Medications   HYDRALAZINE (APRESOLINE) 100 MG TABLET    Take 100 mg by mouth 2 (two) times daily. Hold for SBP <110     Physical Exam: Filed Vitals:   01/29/16 1040  BP: 147/82  Pulse: 66  Temp: 98.4 F (36.9 C)  Resp: 20  Height: 5\' 3"  (1.6 m)  Weight: 127 lb 9.6 oz (57.879 kg)  SpO2: 95%    Physical Exam  Constitutional: She appears well-developed and well-nourished. No distress.  HENT:  Head: Normocephalic and atraumatic.  Mouth/Throat: Oropharynx is clear and moist. No oropharyngeal exudate.  Eyes: Conjunctivae are normal. Pupils are equal, round, and reactive to light.  Neck: Normal range of motion. Neck supple.  Cardiovascular: Normal rate, regular rhythm and normal heart sounds.   Pulmonary/Chest: Effort normal and breath sounds normal.  Abdominal: Soft. Bowel sounds are normal.  Musculoskeletal: She exhibits no edema or tenderness.  Left sided weakness  Neurological: She is alert.  Skin: Skin is warm and dry. She is not diaphoretic.  Psychiatric: She has a normal mood and affect.    Labs reviewed: Basic Metabolic Panel:  Recent Labs  01/01/16 01/02/16  NA 138 139  K 3.6 3.3*    BUN 14 12  CREATININE 0.7 0.6   Liver Function Tests:  Recent Labs  01/02/16  AST 16  ALT 9  ALKPHOS 91   No results for input(s): LIPASE, AMYLASE in the last 8760 hours. No results for input(s): AMMONIA in the last 8760 hours. CBC:  Recent Labs  01/01/16 01/02/16  WBC 9.3 7.4  HGB 13.6 13.4  HCT 42 43  PLT 202 195  TSH: No results for input(s): TSH in the last 8760 hours. A1C: Lab Results  Component Value Date   HGBA1C 5.1 01/02/2016   Lipid Panel:  Recent Labs  01/02/16  CHOL 279*  HDL 49  LDLCALC 201  TRIG 149    Radiological Exams: Dg Esophagus  05/18/2013  *RADIOLOGY REPORT* Clinical Data:Dysphagia ESOPHAGUS/BARIUM SWALLOW/TABLET STUDY Fluoroscopy Time: 1 minute 42 seconds Comparison: Barium swallow of 08/05/2011 Findings: Rapid sequence spot films over the cervical region were performed.  There is slight prominence of the cricopharyngeus muscle noted.  There are moderate tertiary contractions in the mid and distal esophagus.  A moderate sized hiatal hernia again is noted.  Mild gastroesophageal reflux is demonstrated.  A barium pill was given at the end of the study which did lodge within the distal esophagus just above the hiatal hernia most consistent with a short segment distal esophageal stricture. IMPRESSION: 1.  No change in moderate sized hiatal hernia. 2.  Barium pill lodges just above the hiatal hernia consistent with a short segment distal esophageal stricture. 3.  Moderate tertiary contractions in the mid and distal esophagus. 4.  Somewhat prominent cricopharyngeus muscle. Original Report Authenticated By: Ivar Drape, M.D.    Assessment/Plan 1. Late effects of CVA (cerebrovascular accident) Right MCA stroke on recent hospitalization with left sided weakness. Continue aspirin 81 mg daily with lipitor 40 mg daily Has establish and following with neurologist   2. Physical deconditioning -improved while at [facility]. Will dc home with therapy  3.  Essential hypertension, benign -blood pressure stable, continue hydralazine 50 mg bid, labetalol 200 mg bid   4. Hyperlipidemia LDL goal <100 -conts on lipitor   5. Other specified hypothyroidism conts on synthroid 50 mcg   6. Cervicalgia Stable, cont PRN medication and Lidoderm patch   7. Major depression, chronic (HCC) Remains stable, maintained on Effexor and trazodone   8. Oral infection Followed by dentist, plan for abstraction in 3 days. conts on cleocin for infection and hydrocodone/apap for pain.   pt is stable for discharge-will need PT/OT/ST/HHA per home health. No DME needed. Rx written.  will need to follow up with PCP within 2 weeks.     Carlos American. Harle Battiest  [facility] [phone] 8 am - 5 pm) [phone] (after hours)

## 2016-02-01 DIAGNOSIS — I69354 Hemiplegia and hemiparesis following cerebral infarction affecting left non-dominant side: Secondary | ICD-10-CM | POA: Diagnosis not present

## 2016-02-01 DIAGNOSIS — F419 Anxiety disorder, unspecified: Secondary | ICD-10-CM | POA: Diagnosis not present

## 2016-02-01 DIAGNOSIS — F329 Major depressive disorder, single episode, unspecified: Secondary | ICD-10-CM | POA: Diagnosis not present

## 2016-02-01 DIAGNOSIS — I10 Essential (primary) hypertension: Secondary | ICD-10-CM | POA: Diagnosis not present

## 2016-02-01 DIAGNOSIS — M1990 Unspecified osteoarthritis, unspecified site: Secondary | ICD-10-CM | POA: Diagnosis not present

## 2016-02-01 DIAGNOSIS — J449 Chronic obstructive pulmonary disease, unspecified: Secondary | ICD-10-CM | POA: Diagnosis not present

## 2016-02-07 DIAGNOSIS — F329 Major depressive disorder, single episode, unspecified: Secondary | ICD-10-CM | POA: Diagnosis not present

## 2016-02-07 DIAGNOSIS — I10 Essential (primary) hypertension: Secondary | ICD-10-CM | POA: Diagnosis not present

## 2016-02-07 DIAGNOSIS — F419 Anxiety disorder, unspecified: Secondary | ICD-10-CM | POA: Diagnosis not present

## 2016-02-07 DIAGNOSIS — M1990 Unspecified osteoarthritis, unspecified site: Secondary | ICD-10-CM | POA: Diagnosis not present

## 2016-02-07 DIAGNOSIS — R1013 Epigastric pain: Secondary | ICD-10-CM | POA: Diagnosis not present

## 2016-02-07 DIAGNOSIS — I69354 Hemiplegia and hemiparesis following cerebral infarction affecting left non-dominant side: Secondary | ICD-10-CM | POA: Diagnosis not present

## 2016-02-07 DIAGNOSIS — J449 Chronic obstructive pulmonary disease, unspecified: Secondary | ICD-10-CM | POA: Diagnosis not present

## 2016-02-08 DIAGNOSIS — I10 Essential (primary) hypertension: Secondary | ICD-10-CM | POA: Diagnosis not present

## 2016-02-08 DIAGNOSIS — M797 Fibromyalgia: Secondary | ICD-10-CM | POA: Diagnosis not present

## 2016-02-08 DIAGNOSIS — F17200 Nicotine dependence, unspecified, uncomplicated: Secondary | ICD-10-CM | POA: Diagnosis not present

## 2016-02-08 DIAGNOSIS — I639 Cerebral infarction, unspecified: Secondary | ICD-10-CM | POA: Diagnosis not present

## 2016-02-08 DIAGNOSIS — E7800 Pure hypercholesterolemia, unspecified: Secondary | ICD-10-CM | POA: Diagnosis not present

## 2016-02-09 DIAGNOSIS — I10 Essential (primary) hypertension: Secondary | ICD-10-CM | POA: Diagnosis not present

## 2016-02-09 DIAGNOSIS — J449 Chronic obstructive pulmonary disease, unspecified: Secondary | ICD-10-CM | POA: Diagnosis not present

## 2016-02-09 DIAGNOSIS — M1990 Unspecified osteoarthritis, unspecified site: Secondary | ICD-10-CM | POA: Diagnosis not present

## 2016-02-09 DIAGNOSIS — F419 Anxiety disorder, unspecified: Secondary | ICD-10-CM | POA: Diagnosis not present

## 2016-02-09 DIAGNOSIS — I69354 Hemiplegia and hemiparesis following cerebral infarction affecting left non-dominant side: Secondary | ICD-10-CM | POA: Diagnosis not present

## 2016-02-09 DIAGNOSIS — F329 Major depressive disorder, single episode, unspecified: Secondary | ICD-10-CM | POA: Diagnosis not present

## 2016-02-13 DIAGNOSIS — J449 Chronic obstructive pulmonary disease, unspecified: Secondary | ICD-10-CM | POA: Diagnosis not present

## 2016-02-13 DIAGNOSIS — F419 Anxiety disorder, unspecified: Secondary | ICD-10-CM | POA: Diagnosis not present

## 2016-02-13 DIAGNOSIS — M1990 Unspecified osteoarthritis, unspecified site: Secondary | ICD-10-CM | POA: Diagnosis not present

## 2016-02-13 DIAGNOSIS — F329 Major depressive disorder, single episode, unspecified: Secondary | ICD-10-CM | POA: Diagnosis not present

## 2016-02-13 DIAGNOSIS — I10 Essential (primary) hypertension: Secondary | ICD-10-CM | POA: Diagnosis not present

## 2016-02-13 DIAGNOSIS — I69354 Hemiplegia and hemiparesis following cerebral infarction affecting left non-dominant side: Secondary | ICD-10-CM | POA: Diagnosis not present

## 2016-02-14 DIAGNOSIS — J449 Chronic obstructive pulmonary disease, unspecified: Secondary | ICD-10-CM | POA: Diagnosis not present

## 2016-02-14 DIAGNOSIS — F419 Anxiety disorder, unspecified: Secondary | ICD-10-CM | POA: Diagnosis not present

## 2016-02-14 DIAGNOSIS — I69354 Hemiplegia and hemiparesis following cerebral infarction affecting left non-dominant side: Secondary | ICD-10-CM | POA: Diagnosis not present

## 2016-02-14 DIAGNOSIS — F329 Major depressive disorder, single episode, unspecified: Secondary | ICD-10-CM | POA: Diagnosis not present

## 2016-02-14 DIAGNOSIS — I10 Essential (primary) hypertension: Secondary | ICD-10-CM | POA: Diagnosis not present

## 2016-02-14 DIAGNOSIS — M1990 Unspecified osteoarthritis, unspecified site: Secondary | ICD-10-CM | POA: Diagnosis not present

## 2016-02-16 DIAGNOSIS — F419 Anxiety disorder, unspecified: Secondary | ICD-10-CM | POA: Diagnosis not present

## 2016-02-16 DIAGNOSIS — J449 Chronic obstructive pulmonary disease, unspecified: Secondary | ICD-10-CM | POA: Diagnosis not present

## 2016-02-16 DIAGNOSIS — M1990 Unspecified osteoarthritis, unspecified site: Secondary | ICD-10-CM | POA: Diagnosis not present

## 2016-02-16 DIAGNOSIS — I10 Essential (primary) hypertension: Secondary | ICD-10-CM | POA: Diagnosis not present

## 2016-02-16 DIAGNOSIS — F329 Major depressive disorder, single episode, unspecified: Secondary | ICD-10-CM | POA: Diagnosis not present

## 2016-02-16 DIAGNOSIS — I69354 Hemiplegia and hemiparesis following cerebral infarction affecting left non-dominant side: Secondary | ICD-10-CM | POA: Diagnosis not present

## 2016-02-20 DIAGNOSIS — M1990 Unspecified osteoarthritis, unspecified site: Secondary | ICD-10-CM | POA: Diagnosis not present

## 2016-02-20 DIAGNOSIS — F419 Anxiety disorder, unspecified: Secondary | ICD-10-CM | POA: Diagnosis not present

## 2016-02-20 DIAGNOSIS — I10 Essential (primary) hypertension: Secondary | ICD-10-CM | POA: Diagnosis not present

## 2016-02-20 DIAGNOSIS — J449 Chronic obstructive pulmonary disease, unspecified: Secondary | ICD-10-CM | POA: Diagnosis not present

## 2016-02-20 DIAGNOSIS — F329 Major depressive disorder, single episode, unspecified: Secondary | ICD-10-CM | POA: Diagnosis not present

## 2016-02-20 DIAGNOSIS — I69354 Hemiplegia and hemiparesis following cerebral infarction affecting left non-dominant side: Secondary | ICD-10-CM | POA: Diagnosis not present

## 2016-02-29 DIAGNOSIS — I10 Essential (primary) hypertension: Secondary | ICD-10-CM | POA: Diagnosis not present

## 2016-02-29 DIAGNOSIS — F329 Major depressive disorder, single episode, unspecified: Secondary | ICD-10-CM | POA: Diagnosis not present

## 2016-02-29 DIAGNOSIS — M1990 Unspecified osteoarthritis, unspecified site: Secondary | ICD-10-CM | POA: Diagnosis not present

## 2016-02-29 DIAGNOSIS — F419 Anxiety disorder, unspecified: Secondary | ICD-10-CM | POA: Diagnosis not present

## 2016-02-29 DIAGNOSIS — I69354 Hemiplegia and hemiparesis following cerebral infarction affecting left non-dominant side: Secondary | ICD-10-CM | POA: Diagnosis not present

## 2016-02-29 DIAGNOSIS — J449 Chronic obstructive pulmonary disease, unspecified: Secondary | ICD-10-CM | POA: Diagnosis not present

## 2016-03-06 DIAGNOSIS — F329 Major depressive disorder, single episode, unspecified: Secondary | ICD-10-CM | POA: Diagnosis not present

## 2016-03-06 DIAGNOSIS — M1990 Unspecified osteoarthritis, unspecified site: Secondary | ICD-10-CM | POA: Diagnosis not present

## 2016-03-06 DIAGNOSIS — I10 Essential (primary) hypertension: Secondary | ICD-10-CM | POA: Diagnosis not present

## 2016-03-06 DIAGNOSIS — F419 Anxiety disorder, unspecified: Secondary | ICD-10-CM | POA: Diagnosis not present

## 2016-03-06 DIAGNOSIS — I69354 Hemiplegia and hemiparesis following cerebral infarction affecting left non-dominant side: Secondary | ICD-10-CM | POA: Diagnosis not present

## 2016-03-06 DIAGNOSIS — J449 Chronic obstructive pulmonary disease, unspecified: Secondary | ICD-10-CM | POA: Diagnosis not present

## 2016-03-08 DIAGNOSIS — F419 Anxiety disorder, unspecified: Secondary | ICD-10-CM | POA: Diagnosis not present

## 2016-03-08 DIAGNOSIS — M1990 Unspecified osteoarthritis, unspecified site: Secondary | ICD-10-CM | POA: Diagnosis not present

## 2016-03-08 DIAGNOSIS — J449 Chronic obstructive pulmonary disease, unspecified: Secondary | ICD-10-CM | POA: Diagnosis not present

## 2016-03-08 DIAGNOSIS — I69354 Hemiplegia and hemiparesis following cerebral infarction affecting left non-dominant side: Secondary | ICD-10-CM | POA: Diagnosis not present

## 2016-03-08 DIAGNOSIS — F329 Major depressive disorder, single episode, unspecified: Secondary | ICD-10-CM | POA: Diagnosis not present

## 2016-03-08 DIAGNOSIS — I10 Essential (primary) hypertension: Secondary | ICD-10-CM | POA: Diagnosis not present

## 2016-03-15 DIAGNOSIS — F329 Major depressive disorder, single episode, unspecified: Secondary | ICD-10-CM | POA: Diagnosis not present

## 2016-03-15 DIAGNOSIS — I10 Essential (primary) hypertension: Secondary | ICD-10-CM | POA: Diagnosis not present

## 2016-03-15 DIAGNOSIS — F419 Anxiety disorder, unspecified: Secondary | ICD-10-CM | POA: Diagnosis not present

## 2016-03-15 DIAGNOSIS — I69354 Hemiplegia and hemiparesis following cerebral infarction affecting left non-dominant side: Secondary | ICD-10-CM | POA: Diagnosis not present

## 2016-03-15 DIAGNOSIS — M1990 Unspecified osteoarthritis, unspecified site: Secondary | ICD-10-CM | POA: Diagnosis not present

## 2016-03-15 DIAGNOSIS — J449 Chronic obstructive pulmonary disease, unspecified: Secondary | ICD-10-CM | POA: Diagnosis not present

## 2016-03-19 DIAGNOSIS — M797 Fibromyalgia: Secondary | ICD-10-CM | POA: Diagnosis not present

## 2016-03-19 DIAGNOSIS — E7800 Pure hypercholesterolemia, unspecified: Secondary | ICD-10-CM | POA: Diagnosis not present

## 2016-03-19 DIAGNOSIS — I639 Cerebral infarction, unspecified: Secondary | ICD-10-CM | POA: Diagnosis not present

## 2016-03-19 DIAGNOSIS — I10 Essential (primary) hypertension: Secondary | ICD-10-CM | POA: Diagnosis not present

## 2016-03-19 DIAGNOSIS — F17210 Nicotine dependence, cigarettes, uncomplicated: Secondary | ICD-10-CM | POA: Diagnosis not present

## 2016-03-19 DIAGNOSIS — E876 Hypokalemia: Secondary | ICD-10-CM | POA: Diagnosis not present

## 2016-03-19 DIAGNOSIS — F17200 Nicotine dependence, unspecified, uncomplicated: Secondary | ICD-10-CM | POA: Diagnosis not present

## 2016-04-05 DIAGNOSIS — D0439 Carcinoma in situ of skin of other parts of face: Secondary | ICD-10-CM | POA: Diagnosis not present

## 2016-04-05 DIAGNOSIS — R208 Other disturbances of skin sensation: Secondary | ICD-10-CM | POA: Diagnosis not present

## 2016-04-24 DIAGNOSIS — R531 Weakness: Secondary | ICD-10-CM | POA: Diagnosis not present

## 2016-04-24 DIAGNOSIS — R404 Transient alteration of awareness: Secondary | ICD-10-CM | POA: Diagnosis not present

## 2016-05-22 ENCOUNTER — Other Ambulatory Visit: Payer: Self-pay | Admitting: Nurse Practitioner

## 2016-06-01 ENCOUNTER — Other Ambulatory Visit: Payer: Self-pay | Admitting: Nurse Practitioner

## 2016-06-24 ENCOUNTER — Encounter: Payer: Self-pay | Admitting: Neurology

## 2016-06-24 ENCOUNTER — Ambulatory Visit (INDEPENDENT_AMBULATORY_CARE_PROVIDER_SITE_OTHER): Payer: Medicare Other | Admitting: Neurology

## 2016-06-24 VITALS — BP 173/90 | HR 66 | Ht 63.0 in | Wt 114.8 lb

## 2016-06-24 DIAGNOSIS — I10 Essential (primary) hypertension: Secondary | ICD-10-CM | POA: Diagnosis not present

## 2016-06-24 DIAGNOSIS — R413 Other amnesia: Secondary | ICD-10-CM | POA: Diagnosis not present

## 2016-06-24 DIAGNOSIS — I63511 Cerebral infarction due to unspecified occlusion or stenosis of right middle cerebral artery: Secondary | ICD-10-CM | POA: Diagnosis not present

## 2016-06-24 NOTE — Progress Notes (Signed)
Chief Complaint  Patient presents with  . Cerebrovascular Accident    She is here with her son, Kendra Hernandez.   States she is back at home alone.  She has completed PT and OT.  She has continued to take daily aspirin 81mg  daily.   . Memory Loss    MMSE 29/30 - 13 animals.  She is unable to handle her finances.  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  She is still drinking alcohol some (son states she has binges of use) and smoking.  . Gait Problems    Her gait has improved and she is walking without assistance.  However, she has still had several falls.      PATIENT: Kendra Hernandez DOB: September 29, 1941  Chief Complaint  Patient presents with  . Cerebrovascular Accident    She is here with her son, Kendra Hernandez.   States she is back at home alone.  She has completed PT and OT.  She has continued to take daily aspirin 81mg  daily.   . Memory Loss    MMSE 29/30 - 13 animals.  She is unable to handle her finances.  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  She is still drinking alcohol some (son states she has binges of use) and smoking.  . Gait Problems    Her gait has improved and she is walking without assistance.  However, she has still had several falls.     HISTORICAL  Kendra Hernandez a 75 years old right-handed female,accompanied by her son Kendra Hernandez, seen in refer by Dr. Delfina Hernandez for valuation of stroke in Jan 2017.   I reviewed and summarized per hospital record from [facility],   She had past medical history of hypertension, anxiety, hyperlipidemia, hypothyroidism, COPD, used to be a heavy smoker, and heavy drinker, was admitted to local hospital in January 01 2016 for acute onset of weakness, falling which happened in December 31st 2016. patient is a poor historian, also hard of hearing,,   She lives alone at [facility], prior to the stroke, she smoke at least 1 pack daily, independent of living, still drives, no significant memory trouble,  since stroke, she was noted to have increased memory trouble, tends to repeat herself, she continue has mild left-sided weakness, she had a gradual onset gait difficulty for many years, acute worsening since her stroke, she denies bowel and bladder incontinence, she currently lives at [facility], will be discharged to her own apartment soon.   I have reviewed MRI of the brain, showed acute right MCA infarction, per description, involving right parietal, temporal, insular, mild supratentorium small vessel disease   Laboratory evaluation showed normal CBC with hemoglobin of 13 point 6, normal CMP, with creatinine of 0.6 2, GFR more than 90, lipid profile showed elevated cholesterol 279, LDL 201,  Ultrasound of carotid artery showed no evidence of large vessel bilateral internal carotid stenosis  She has a history of chronic neck pain, diffuse body achy pain, fibromyalgia, has a tendency to over medicate herself with pain medications.   UPDATE June 26th 2017: She is better, she walks well but still with left-sided weakness, she can cook, she has difficulty keeping finance, enhanced overspent constantly, she is no longer driving,  She goes over her budget every week,  She does not sleep well, no eating well.  She has no appetite, she still smoke, at least 1ppd, she does drink hard liquor, 40 oz hard liquor a day, bing  drink sometimes, she often call taxi to go to liquor store.  REVIEW OF SYSTEMS: Full 14 system review of systems performed and notable only for excessive sweating, hearing loss, drooling, insomnia, constipation, urgency, joint pain, back pain, achy muscles, neck pain, bruise easily, memory loss, confusion, depression anxiety  ALLERGIES: Allergies  Allergen Reactions  . Ceftin [Cefuroxime Axetil]   . Penicillins Hives    Hives and swelling as a child   . Simvastatin     Patient can not recall side effect     HOME MEDICATIONS: Current Outpatient Prescriptions    Medication Sig Dispense Refill  . acetaminophen (TYLENOL ARTHRITIS PAIN) 650 MG CR tablet Take 650 mg by mouth every 8 (eight) hours as needed for pain.    Marland Kitchen aspirin EC 81 MG tablet Take 81 mg by mouth daily. With breakfast    . baclofen (LIORESAL) 10 MG tablet Take 10 mg by mouth 3 (three) times daily.    . hydrALAZINE (APRESOLINE) 50 MG tablet Take 50 mg by mouth 2 (two) times daily.    Marland Kitchen labetalol (NORMODYNE) 200 MG tablet Take 200 mg by mouth 2 (two) times daily.    Marland Kitchen levothyroxine (SYNTHROID, LEVOTHROID) 50 MCG tablet Take 50 mcg by mouth daily before breakfast.    . omeprazole (PRILOSEC) 40 MG capsule Take 40 mg by mouth daily.    . sucralfate (CARAFATE) 1 g tablet Take 1 g by mouth 4 (four) times daily -  with meals and at bedtime.    . traZODone (DESYREL) 100 MG tablet 2 by mouth every evening    . venlafaxine (EFFEXOR) 100 MG tablet Take 100 mg by mouth 2 (two) times daily.     No current facility-administered medications for this visit.    PAST MEDICAL HISTORY: Past Medical History  Diagnosis Date  . Stroke (cerebrum) ([place])   . Repeated falls   . Hypertension   . Hyperlipidemia   . Insomnia   . Hypothyroid   . Reflux   . Anxiety   . Stress   . Skin cancer   . Arthritis   . COPD (chronic obstructive pulmonary disease) ([place])     PAST SURGICAL HISTORY: Past Surgical History  Procedure Laterality Date  . Toe surgery Right     Removal of toe   . Mandible fracture surgery      Placement of partial jaw due to abcess  . Skin cancer excision      Removal of several skin cancers  . Colonoscopy      With polyp removal     FAMILY HISTORY: Family History  Problem Relation Age of Onset  . Emphysema Brother   . Emphysema Mother   . Heart attack Father   . Rheumatologic disease Mother   . Pancreatic cancer Daughter     SOCIAL HISTORY:  Social History   Social History  . Marital Status: Divorced    Spouse Name: N/A  . Number of Children: 2  . Years of  Education: 13   Occupational History  . Retired    Social History Main Topics  . Smoking status: Current Every Day Smoker -- 1.00 packs/day for 30 years    Types: Cigarettes  . Smokeless tobacco: Not on file  . Alcohol Use: No  . Drug Use: No  . Sexual Activity: Not on file   Other Topics Concern  . Not on file   Social History Narrative   Right-handed.   Currently in stroke rehab at [facility].  Occasional caffeine use.     PHYSICAL EXAM   Filed Vitals:   06/24/16 0840  BP: 173/90  Pulse: 66  Height: 5\' 3"  (1.6 m)  Weight: 114 lb 12 oz (52.05 kg)    Not recorded      Body mass index is 20.33 kg/(m^2).  PHYSICAL EXAMNIATION:  Gen: NAD, conversant, well nourised, obese, well groomed                     Cardiovascular: Regular rate rhythm, no peripheral edema, warm, nontender. Eyes: Conjunctivae clear without exudates or hemorrhage Neck: Supple, no carotid bruise. Pulmonary: Clear to auscultation bilaterally   NEUROLOGICAL EXAM:  MENTAL STATUS: Speech:    Speech is normal; fluent and spontaneous with normal comprehension.  Cognition: MMSE 29 /30, animal naming 13.     Orientation to time, place and person     recent and remote memory. She missed 1 out of 3 recalls      Normal Attention span and concentration     Normal Language, naming, repeating,spontaneous speech     Fund of knowledge   CRANIAL NERVES: CN II: Visual fields are full to confrontation. Pupils are round equal and briskly reactive to light. CN III, IV, VI: extraocular movement are normal. No ptosis. CN V: Facial sensation is intact to pinprick in all 3 divisions bilaterally. Corneal responses are intact.  CN VII: Face is symmetric with normal eye closure and smile. CN VIII: She is very hard of hearing CN IX, X: Palate elevates symmetrically. Phonation is normal. CN XI: Head turning and shoulder shrug are intact CN XII: Tongue is midline with normal movements and no  atrophy.  MOTOR: She has mild left upper extremity proximal and distal weakness, mild left lower extremity proximal and distal weakness  REFLEXES: Reflexes are  3 and  symmetric at the biceps, triceps, knees, and ankles. Plantar responseextensor bilaterally   SENSORY: Intact to light touch, pinprick, position sense, and vibration sense are intact in fingers and toes.  COORDINATION: Rapid alternating movements and fine finger movements are intact. There is no dysmetria on finger-to-nose and heel-knee-shin.    GAIT/STANCE:  she need to push up to get up from seated position, cautious, mildly unsteady stiff gait , dragging her left side  DIAGNOSTIC DATA (LABS, IMAGING, TESTING) - I reviewed patient records, labs, notes, testing and imaging myself where available.   ASSESSMENT AND PLAN  Raeven Lathen is a 75 y.o. female    Right MCA stroke  With mild residual left hemiparesis, ambulate without assistance  Keep current dose of aspirin daily  Home physical therapy and social worker consultation  Continue to address vascular risk factor, goal blood pressure is less than 130 over 80, LDL less than 100, stop smoking, heavy drinking  Memory loss  Mini-Mental Status Examination is 29 out of 30 today   Refer to neuropsychology  Evaluation  Her son Kendra Hernandez has started POA process.  Marcial Pacas, M.D. Ph.D.  [facility] [address] [place], [place] [address] Ph: [phone] Fax: [phone]  CC: Seward Carol, MD

## 2016-06-25 ENCOUNTER — Other Ambulatory Visit: Payer: Self-pay | Admitting: Neurology

## 2016-06-25 DIAGNOSIS — R413 Other amnesia: Secondary | ICD-10-CM

## 2016-06-25 DIAGNOSIS — R269 Unspecified abnormalities of gait and mobility: Secondary | ICD-10-CM

## 2016-06-25 DIAGNOSIS — I63511 Cerebral infarction due to unspecified occlusion or stenosis of right middle cerebral artery: Secondary | ICD-10-CM

## 2016-07-01 ENCOUNTER — Other Ambulatory Visit: Payer: Self-pay | Admitting: Nurse Practitioner

## 2016-07-05 ENCOUNTER — Telehealth: Payer: Self-pay | Admitting: Neurology

## 2016-07-05 NOTE — Telephone Encounter (Signed)
Patient's home health nurse called and said that she is pain from fibromyalgia, dr Krista Blue does not treat patient for pain or for her fibromyalgia and she has not been evaluated for fibromyalgia pain in this office. Asked for pain medication, advised the office is closed and by policy we do not prescribe narcotic pain medications after hours. If she is in new pain she needs to be evaluated at the ED.

## 2016-07-15 ENCOUNTER — Emergency Department (HOSPITAL_COMMUNITY)
Admission: EM | Admit: 2016-07-15 | Discharge: 2016-07-15 | Disposition: A | Discharge status: Home / Self Care | Payer: Medicare Other | Source: Home / Self Care | Attending: Emergency Medicine | Admitting: Emergency Medicine

## 2016-07-15 ENCOUNTER — Encounter (HOSPITAL_COMMUNITY): Payer: Self-pay

## 2016-07-15 ENCOUNTER — Emergency Department (HOSPITAL_COMMUNITY): Payer: Medicare Other

## 2016-07-15 DIAGNOSIS — F10120 Alcohol abuse with intoxication, uncomplicated: Secondary | ICD-10-CM

## 2016-07-15 DIAGNOSIS — J449 Chronic obstructive pulmonary disease, unspecified: Secondary | ICD-10-CM | POA: Insufficient documentation

## 2016-07-15 DIAGNOSIS — S0012XA Contusion of left eyelid and periocular area, initial encounter: Secondary | ICD-10-CM | POA: Diagnosis not present

## 2016-07-15 DIAGNOSIS — W1839XA Other fall on same level, initial encounter: Secondary | ICD-10-CM

## 2016-07-15 DIAGNOSIS — I10 Essential (primary) hypertension: Secondary | ICD-10-CM | POA: Insufficient documentation

## 2016-07-15 DIAGNOSIS — E039 Hypothyroidism, unspecified: Secondary | ICD-10-CM

## 2016-07-15 DIAGNOSIS — F10239 Alcohol dependence with withdrawal, unspecified: Secondary | ICD-10-CM | POA: Diagnosis not present

## 2016-07-15 DIAGNOSIS — Y999 Unspecified external cause status: Secondary | ICD-10-CM | POA: Insufficient documentation

## 2016-07-15 DIAGNOSIS — T148 Other injury of unspecified body region: Secondary | ICD-10-CM | POA: Diagnosis not present

## 2016-07-15 DIAGNOSIS — S0990XA Unspecified injury of head, initial encounter: Secondary | ICD-10-CM | POA: Diagnosis not present

## 2016-07-15 DIAGNOSIS — M25511 Pain in right shoulder: Secondary | ICD-10-CM | POA: Diagnosis not present

## 2016-07-15 DIAGNOSIS — F10920 Alcohol use, unspecified with intoxication, uncomplicated: Secondary | ICD-10-CM

## 2016-07-15 DIAGNOSIS — Z85828 Personal history of other malignant neoplasm of skin: Secondary | ICD-10-CM

## 2016-07-15 DIAGNOSIS — Y9389 Activity, other specified: Secondary | ICD-10-CM

## 2016-07-15 DIAGNOSIS — F17210 Nicotine dependence, cigarettes, uncomplicated: Secondary | ICD-10-CM

## 2016-07-15 DIAGNOSIS — Y929 Unspecified place or not applicable: Secondary | ICD-10-CM | POA: Insufficient documentation

## 2016-07-15 DIAGNOSIS — S0993XA Unspecified injury of face, initial encounter: Secondary | ICD-10-CM | POA: Diagnosis not present

## 2016-07-15 DIAGNOSIS — W19XXXA Unspecified fall, initial encounter: Secondary | ICD-10-CM

## 2016-07-15 DIAGNOSIS — Z79899 Other long term (current) drug therapy: Secondary | ICD-10-CM | POA: Insufficient documentation

## 2016-07-15 DIAGNOSIS — R51 Headache: Secondary | ICD-10-CM | POA: Diagnosis not present

## 2016-07-15 DIAGNOSIS — Z7982 Long term (current) use of aspirin: Secondary | ICD-10-CM

## 2016-07-15 DIAGNOSIS — S199XXA Unspecified injury of neck, initial encounter: Secondary | ICD-10-CM | POA: Diagnosis not present

## 2016-07-15 DIAGNOSIS — S4991XA Unspecified injury of right shoulder and upper arm, initial encounter: Secondary | ICD-10-CM | POA: Diagnosis not present

## 2016-07-15 DIAGNOSIS — M542 Cervicalgia: Secondary | ICD-10-CM | POA: Diagnosis not present

## 2016-07-15 LAB — CBC WITH DIFFERENTIAL/PLATELET
Basophils Absolute: 0 10*3/uL (ref 0.0–0.1)
Basophils Relative: 1 %
Eosinophils Absolute: 0.1 10*3/uL (ref 0.0–0.7)
Eosinophils Relative: 1 %
HCT: 38.9 % (ref 36.0–46.0)
Hemoglobin: 13 g/dL (ref 12.0–15.0)
Lymphocytes Relative: 26 %
Lymphs Abs: 1.6 10*3/uL (ref 0.7–4.0)
MCH: 31.3 pg (ref 26.0–34.0)
MCHC: 33.4 g/dL (ref 30.0–36.0)
MCV: 93.5 fL (ref 78.0–100.0)
Monocytes Absolute: 0.6 10*3/uL (ref 0.1–1.0)
Monocytes Relative: 9 %
Neutro Abs: 4 10*3/uL (ref 1.7–7.7)
Neutrophils Relative %: 63 %
Platelets: 184 10*3/uL (ref 150–400)
RBC: 4.16 MIL/uL (ref 3.87–5.11)
RDW: 15.8 % — ABNORMAL HIGH (ref 11.5–15.5)
WBC: 6.3 10*3/uL (ref 4.0–10.5)

## 2016-07-15 LAB — URINALYSIS, ROUTINE W REFLEX MICROSCOPIC
Bilirubin Urine: NEGATIVE
Glucose, UA: NEGATIVE mg/dL
Hgb urine dipstick: NEGATIVE
Ketones, ur: NEGATIVE mg/dL
Leukocytes, UA: NEGATIVE
Nitrite: NEGATIVE
Protein, ur: NEGATIVE mg/dL
Specific Gravity, Urine: 1.005 (ref 1.005–1.030)
pH: 6 (ref 5.0–8.0)

## 2016-07-15 LAB — COMPREHENSIVE METABOLIC PANEL
ALT: 13 U/L — ABNORMAL LOW (ref 14–54)
AST: 18 U/L (ref 15–41)
Albumin: 4.2 g/dL (ref 3.5–5.0)
Alkaline Phosphatase: 53 U/L (ref 38–126)
Anion gap: 9 (ref 5–15)
BUN: 13 mg/dL (ref 6–20)
CO2: 23 mmol/L (ref 22–32)
Calcium: 9.2 mg/dL (ref 8.9–10.3)
Chloride: 109 mmol/L (ref 101–111)
Creatinine, Ser: 0.64 mg/dL (ref 0.44–1.00)
GFR calc Af Amer: >60 mL/min (ref >60)
GFR calc non Af Amer: >60 mL/min (ref >60)
Glucose, Bld: 92 mg/dL (ref 65–99)
Potassium: 3.5 mmol/L (ref 3.5–5.1)
Sodium: 141 mmol/L (ref 135–145)
Total Bilirubin: 0.4 mg/dL (ref 0.3–1.2)
Total Protein: 6.8 g/dL (ref 6.5–8.1)

## 2016-07-15 LAB — ETHANOL: Alcohol, Ethyl (B): 172 mg/dL — ABNORMAL HIGH (ref <5)

## 2016-07-15 MED ORDER — SODIUM CHLORIDE 0.9 % IV BOLUS (SEPSIS)
1000.0000 mL | Freq: Once | INTRAVENOUS | Status: AC
  Administered 2016-07-15: 1000 mL via INTRAVENOUS

## 2016-07-15 NOTE — ED Notes (Signed)
Per [facility]- Pt resides at home. Pt admits to drinking 1 40 oz beer today. Pt states she felt dizzy and fell. NO LOC. Pt c/o of neck and back pain rt side. Pt states this is chronic. No increase. Towel around neck applied by [facility] for spinal support. Abrasion to Head posterior -bleeding controlled. Pt is on no blood thinners. Pt denies N/V/D and fever CP/SOB. EKG unremarkable.

## 2016-07-15 NOTE — ED Notes (Signed)
Pt transported to CT ?

## 2016-07-15 NOTE — Discharge Instructions (Signed)

## 2016-07-15 NOTE — ED Notes (Signed)
Bed: [id number] Expected date:  Expected time:  Means of arrival:  Comments: Ems- 75yo F, fall/head injury

## 2016-07-15 NOTE — ED Notes (Signed)
[facility] arrived.  

## 2016-07-15 NOTE — ED Notes (Signed)
[facility] called  

## 2016-07-15 NOTE — ED Notes (Signed)
MD at bedside. EDP KOHUT PRESENT 

## 2016-07-15 NOTE — ED Notes (Signed)
Pt continually getting up and wandering. Pt repeatedly verbally corrected.

## 2016-07-16 ENCOUNTER — Emergency Department (HOSPITAL_COMMUNITY): Payer: Medicare Other

## 2016-07-16 ENCOUNTER — Inpatient Hospital Stay (HOSPITAL_COMMUNITY)
Admission: EM | Admit: 2016-07-16 | Discharge: 2016-07-21 | DRG: 894 | Discharge status: Against Medical Advice | Payer: Medicare Other | Attending: Internal Medicine | Admitting: Internal Medicine

## 2016-07-16 ENCOUNTER — Encounter (HOSPITAL_COMMUNITY): Payer: Self-pay | Admitting: Emergency Medicine

## 2016-07-16 DIAGNOSIS — F10920 Alcohol use, unspecified with intoxication, uncomplicated: Secondary | ICD-10-CM

## 2016-07-16 DIAGNOSIS — E876 Hypokalemia: Secondary | ICD-10-CM | POA: Diagnosis present

## 2016-07-16 DIAGNOSIS — M542 Cervicalgia: Secondary | ICD-10-CM | POA: Diagnosis not present

## 2016-07-16 DIAGNOSIS — R296 Repeated falls: Secondary | ICD-10-CM | POA: Diagnosis present

## 2016-07-16 DIAGNOSIS — J449 Chronic obstructive pulmonary disease, unspecified: Secondary | ICD-10-CM | POA: Diagnosis present

## 2016-07-16 DIAGNOSIS — Y906 Blood alcohol level of 120-199 mg/100 ml: Secondary | ICD-10-CM | POA: Diagnosis present

## 2016-07-16 DIAGNOSIS — G4700 Insomnia, unspecified: Secondary | ICD-10-CM

## 2016-07-16 DIAGNOSIS — S0081XA Abrasion of other part of head, initial encounter: Secondary | ICD-10-CM

## 2016-07-16 DIAGNOSIS — S0993XA Unspecified injury of face, initial encounter: Secondary | ICD-10-CM | POA: Diagnosis not present

## 2016-07-16 DIAGNOSIS — R51 Headache: Secondary | ICD-10-CM | POA: Diagnosis not present

## 2016-07-16 DIAGNOSIS — M25511 Pain in right shoulder: Secondary | ICD-10-CM | POA: Diagnosis not present

## 2016-07-16 DIAGNOSIS — W19XXXA Unspecified fall, initial encounter: Secondary | ICD-10-CM

## 2016-07-16 DIAGNOSIS — E039 Hypothyroidism, unspecified: Secondary | ICD-10-CM

## 2016-07-16 DIAGNOSIS — E785 Hyperlipidemia, unspecified: Secondary | ICD-10-CM | POA: Diagnosis present

## 2016-07-16 DIAGNOSIS — K219 Gastro-esophageal reflux disease without esophagitis: Secondary | ICD-10-CM | POA: Diagnosis present

## 2016-07-16 DIAGNOSIS — I10 Essential (primary) hypertension: Secondary | ICD-10-CM | POA: Diagnosis present

## 2016-07-16 DIAGNOSIS — F17210 Nicotine dependence, cigarettes, uncomplicated: Secondary | ICD-10-CM | POA: Diagnosis present

## 2016-07-16 DIAGNOSIS — S0990XA Unspecified injury of head, initial encounter: Secondary | ICD-10-CM | POA: Diagnosis not present

## 2016-07-16 DIAGNOSIS — S0093XA Contusion of unspecified part of head, initial encounter: Secondary | ICD-10-CM

## 2016-07-16 DIAGNOSIS — F329 Major depressive disorder, single episode, unspecified: Secondary | ICD-10-CM | POA: Diagnosis present

## 2016-07-16 DIAGNOSIS — Z7982 Long term (current) use of aspirin: Secondary | ICD-10-CM

## 2016-07-16 DIAGNOSIS — F10939 Alcohol use, unspecified with withdrawal, unspecified: Secondary | ICD-10-CM | POA: Diagnosis present

## 2016-07-16 DIAGNOSIS — S199XXA Unspecified injury of neck, initial encounter: Secondary | ICD-10-CM | POA: Diagnosis not present

## 2016-07-16 DIAGNOSIS — S0012XA Contusion of left eyelid and periocular area, initial encounter: Secondary | ICD-10-CM | POA: Diagnosis present

## 2016-07-16 DIAGNOSIS — Z85828 Personal history of other malignant neoplasm of skin: Secondary | ICD-10-CM

## 2016-07-16 DIAGNOSIS — F10239 Alcohol dependence with withdrawal, unspecified: Principal | ICD-10-CM | POA: Diagnosis present

## 2016-07-16 DIAGNOSIS — S4991XA Unspecified injury of right shoulder and upper arm, initial encounter: Secondary | ICD-10-CM | POA: Diagnosis not present

## 2016-07-16 DIAGNOSIS — Z8673 Personal history of transient ischemic attack (TIA), and cerebral infarction without residual deficits: Secondary | ICD-10-CM

## 2016-07-16 LAB — CBC WITH DIFFERENTIAL/PLATELET
BASOS ABS: 0.2 10*3/uL — AB (ref 0.0–0.1)
BASOS PCT: 2 %
Eosinophils Absolute: 0.1 10*3/uL (ref 0.0–0.7)
Eosinophils Relative: 1 %
HEMATOCRIT: 40.9 % (ref 36.0–46.0)
HEMOGLOBIN: 13.4 g/dL (ref 12.0–15.0)
Lymphocytes Relative: 15 %
Lymphs Abs: 1.5 10*3/uL (ref 0.7–4.0)
MCH: 31 pg (ref 26.0–34.0)
MCHC: 32.8 g/dL (ref 30.0–36.0)
MCV: 94.7 fL (ref 78.0–100.0)
MONOS PCT: 7 %
Monocytes Absolute: 0.7 10*3/uL (ref 0.1–1.0)
NEUTROS ABS: 7 10*3/uL (ref 1.7–7.7)
NEUTROS PCT: 75 %
Platelets: 186 10*3/uL (ref 150–400)
RBC: 4.32 MIL/uL (ref 3.87–5.11)
RDW: 15.8 % — ABNORMAL HIGH (ref 11.5–15.5)
WBC: 9.5 10*3/uL (ref 4.0–10.5)

## 2016-07-16 LAB — PROTIME-INR
INR: 1.16 (ref 0.00–1.49)
PROTHROMBIN TIME: 15 s (ref 11.6–15.2)

## 2016-07-16 LAB — COMPREHENSIVE METABOLIC PANEL
ALK PHOS: 66 U/L (ref 38–126)
ALT: 14 U/L (ref 14–54)
ANION GAP: 12 (ref 5–15)
AST: 21 U/L (ref 15–41)
Albumin: 4.2 g/dL (ref 3.5–5.0)
BUN: 9 mg/dL (ref 6–20)
CALCIUM: 8.7 mg/dL — AB (ref 8.9–10.3)
CO2: 23 mmol/L (ref 22–32)
Chloride: 108 mmol/L (ref 101–111)
Creatinine, Ser: 0.49 mg/dL (ref 0.44–1.00)
GFR calc non Af Amer: >60 mL/min (ref >60)
Glucose, Bld: 104 mg/dL — ABNORMAL HIGH (ref 65–99)
Potassium: 3.6 mmol/L (ref 3.5–5.1)
SODIUM: 143 mmol/L (ref 135–145)
Total Bilirubin: 0.4 mg/dL (ref 0.3–1.2)
Total Protein: 6.5 g/dL (ref 6.5–8.1)

## 2016-07-16 LAB — ETHANOL: Alcohol, Ethyl (B): 164 mg/dL — ABNORMAL HIGH (ref <5)

## 2016-07-16 MED ORDER — VITAMIN B-1 100 MG PO TABS
100.0000 mg | ORAL_TABLET | Freq: Every day | ORAL | Status: DC
  Administered 2016-07-17 – 2016-07-21 (×5): 100 mg via ORAL
  Filled 2016-07-16 (×5): qty 1

## 2016-07-16 MED ORDER — FOLIC ACID 1 MG PO TABS
1.0000 mg | ORAL_TABLET | Freq: Every day | ORAL | Status: DC
  Administered 2016-07-17 – 2016-07-21 (×5): 1 mg via ORAL
  Filled 2016-07-16 (×5): qty 1

## 2016-07-16 MED ORDER — LORAZEPAM 1 MG PO TABS
1.0000 mg | ORAL_TABLET | Freq: Four times a day (QID) | ORAL | Status: AC | PRN
  Administered 2016-07-17: 1 mg via ORAL
  Filled 2016-07-16: qty 1

## 2016-07-16 MED ORDER — MORPHINE SULFATE (PF) 2 MG/ML IV SOLN
2.0000 mg | INTRAVENOUS | Status: DC | PRN
  Administered 2016-07-16 – 2016-07-20 (×4): 2 mg via INTRAVENOUS
  Filled 2016-07-16 (×4): qty 1

## 2016-07-16 MED ORDER — THIAMINE HCL 100 MG/ML IJ SOLN
100.0000 mg | Freq: Every day | INTRAMUSCULAR | Status: DC
  Administered 2016-07-16: 100 mg via INTRAVENOUS
  Filled 2016-07-16 (×2): qty 2

## 2016-07-16 MED ORDER — ADULT MULTIVITAMIN W/MINERALS CH
1.0000 | ORAL_TABLET | Freq: Every day | ORAL | Status: DC
  Administered 2016-07-17 – 2016-07-21 (×5): 1 via ORAL
  Filled 2016-07-16 (×5): qty 1

## 2016-07-16 MED ORDER — LORAZEPAM 2 MG/ML IJ SOLN
1.0000 mg | Freq: Four times a day (QID) | INTRAMUSCULAR | Status: AC | PRN
  Administered 2016-07-16 – 2016-07-19 (×4): 1 mg via INTRAVENOUS
  Filled 2016-07-16 (×3): qty 1

## 2016-07-16 MED ORDER — LORAZEPAM 2 MG/ML IJ SOLN
0.0000 mg | Freq: Two times a day (BID) | INTRAMUSCULAR | Status: AC
  Administered 2016-07-19 (×2): 1 mg via INTRAVENOUS
  Administered 2016-07-20: 2 mg via INTRAVENOUS
  Filled 2016-07-16 (×3): qty 1

## 2016-07-16 MED ORDER — MORPHINE SULFATE (PF) 2 MG/ML IV SOLN
2.0000 mg | Freq: Once | INTRAVENOUS | Status: AC
  Administered 2016-07-16: 2 mg via INTRAVENOUS
  Filled 2016-07-16: qty 1

## 2016-07-16 MED ORDER — LORAZEPAM 2 MG/ML IJ SOLN
0.0000 mg | Freq: Four times a day (QID) | INTRAMUSCULAR | Status: AC
  Administered 2016-07-17 (×3): 2 mg via INTRAVENOUS
  Administered 2016-07-17 – 2016-07-18 (×2): 1 mg via INTRAVENOUS
  Filled 2016-07-16 (×6): qty 1

## 2016-07-16 NOTE — ED Provider Notes (Signed)
CSN: [id number]     Arrival date & time 07/16/16  1648 History   First MD Initiated Contact with Patient 07/16/16 1704     Chief Complaint  Patient presents with  . Fall  . Eye Injury     (Consider location/radiation/quality/duration/timing/severity/associated sxs/prior Treatment) HPI Comments: Patient is a 75 year old female with history of prior CVA, hypothyroidism, high cholesterol area to presents for evaluation of facial injuries sustained in a fall. The patient was apparently found by a neighbor today to have a small laceration and swollen area above her left eye. The patient has no recollection of falling. She lives by herself. She complains of headache, neck pain, and pain in her right shoulder. She is unable to add much additional history as she has no recollection of the incident that caused this.  Of note is that the patient was seen yesterday after a fall. She apparently struck the back of her head. She underwent imaging at that time and laboratory studies. She was found to be intoxicated with a blood alcohol of 172. EMS tells me there was an empty bottle of alcohol in the house.  Patient is a 75 y.o. female presenting with fall and eye injury. The history is provided by the patient.  Fall This is a new problem. Episode onset: Unknown. The problem occurs constantly. The problem has not changed since onset.Nothing aggravates the symptoms. Nothing relieves the symptoms.  Eye Injury    Past Medical History  Diagnosis Date  . Stroke (cerebrum) ([place])   . Repeated falls   . Hypertension   . Hyperlipidemia   . Insomnia   . Hypothyroid   . Reflux   . Anxiety   . Stress   . Skin cancer   . Arthritis   . COPD (chronic obstructive pulmonary disease) [facility])    Past Surgical History  Procedure Laterality Date  . Toe surgery Right     Removal of toe   . Mandible fracture surgery      Placement of partial jaw due to abcess  . Skin cancer excision      Removal of several skin  cancers  . Colonoscopy      With polyp removal    Family History  Problem Relation Age of Onset  . Emphysema Brother   . Emphysema Mother   . Heart attack Father   . Rheumatologic disease Mother   . Pancreatic cancer Daughter    Social History  Substance Use Topics  . Smoking status: Current Every Day Smoker -- 1.00 packs/day for 30 years    Types: Cigarettes  . Smokeless tobacco: None  . Alcohol Use: 0.0 oz/week    0 Standard drinks or equivalent per week   OB History    No data available     Review of Systems  All other systems reviewed and are negative.     Allergies  Ceftin; Penicillins; and Simvastatin  Home Medications   Prior to Admission medications   Medication Sig Start Date End Date Taking? Authorizing Provider  acetaminophen (TYLENOL ARTHRITIS PAIN) 650 MG CR tablet Take 650 mg by mouth every 8 (eight) hours as needed for pain.    Historical Provider, MD  aspirin EC 81 MG tablet Take 81 mg by mouth daily. With breakfast    Historical Provider, MD  atorvastatin (LIPITOR) 10 MG tablet TK 1 T PO QD 06/30/16   Historical Provider, MD  baclofen (LIORESAL) 10 MG tablet Take 10 mg by mouth 3 (three) times daily.  Historical Provider, MD  hydrALAZINE (APRESOLINE) 50 MG tablet Take 50 mg by mouth 2 (two) times daily.    Historical Provider, MD  labetalol (NORMODYNE) 200 MG tablet Take 200 mg by mouth 2 (two) times daily.    Historical Provider, MD  levothyroxine (SYNTHROID, LEVOTHROID) 50 MCG tablet Take 50 mcg by mouth daily before breakfast.    Historical Provider, MD  loratadine (CLARITIN) 10 MG tablet Take 10 mg by mouth daily.    Historical Provider, MD  omeprazole (PRILOSEC) 40 MG capsule Take 40 mg by mouth daily.    Historical Provider, MD  sucralfate (CARAFATE) 1 g tablet Take 1 g by mouth 4 (four) times daily -  with meals and at bedtime.    Historical Provider, MD  traZODone (DESYREL) 100 MG tablet Take 200 mg by mouth at bedtime. 2 by mouth every evening     Historical Provider, MD  venlafaxine (EFFEXOR) 100 MG tablet Take 100 mg by mouth 2 (two) times daily.    Historical Provider, MD   BP 213/103 mmHg  Pulse 81  Resp 24  SpO2 96% Physical Exam  Constitutional: She is oriented to person, place, and time. She appears well-developed and well-nourished. No distress.  HENT:  Head: Normocephalic.  There is market swelling of the soft tissues to the supraorbital ridge, left eyelid, and lateral to the left eye.  Eyes:  The right pupil is reactive. I am unable to perform a reliable examination of the left eye due to the degree of swelling. I am able to open the eye enough for her to tell me that she can see light when the light is shined.  Neck: Normal range of motion. Neck supple.  Cardiovascular: Normal rate and regular rhythm.  Exam reveals no gallop and no friction rub.   No murmur heard. Pulmonary/Chest: Effort normal and breath sounds normal. No respiratory distress. She has no wheezes.  Abdominal: Soft. Bowel sounds are normal. She exhibits no distension. There is no tenderness.  Musculoskeletal: Normal range of motion.  Neurological: She is alert and oriented to person, place, and time.  Skin: Skin is warm and dry. She is not diaphoretic.  Nursing note and vitals reviewed.   ED Course  Procedures (including critical care time) Labs Review Labs Reviewed  COMPREHENSIVE METABOLIC PANEL  CBC WITH DIFFERENTIAL/PLATELET  ETHANOL  PROTIME-INR    Imaging Review Ct Head Wo Contrast  07/15/2016  CLINICAL DATA:  Dizziness leading to fall. No loss of consciousness. Abrasion to posterior head. EXAM: CT HEAD WITHOUT CONTRAST TECHNIQUE: Contiguous axial images were obtained from the base of the skull through the vertex without intravenous contrast. COMPARISON:  None. FINDINGS: Brain: No intracranial hemorrhage, mass effect, or midline shift. Remote lacunar infarcts in the right basal ganglia and encephalomalacia in the right temporal occipital  lobe likely a sequela of prior MCA distribution infarct. No hydrocephalus. The basilar cisterns are patent. No evidence of acute ischemia. No intracranial fluid collection. Vascular: No hyperdense vessel. Calcification but the distal right M1 branch may be sequela of prior thrombus. There is atherosclerosis of skullbase vasculature. Skull: Small right posterior scalp edema/hematoma. Calvarium is intact. Sinuses/Orbits: Included paranasal sinuses and mastoid air cells are well aerated. Other: None. IMPRESSION: 1.  No acute intracranial abnormality. 2. Remote lacunar infarcts in the right basal ganglia and sequela of remote MCA distribution infarct. Electronically Signed   By: Jeb Levering M.D.   On: 07/15/2016 20:18   I have personally reviewed and evaluated these images and  lab results as part of my medical decision-making.   EKG Interpretation None      MDM   Final diagnoses:  None    Patient presents here after an apparent fall at home. She was brought by EMS after a family friend found her with a large contusion around her left eye. Her CT scans are negative with the exception of a large hematoma to the left supraorbital ridge. Her eye exam was somewhat limited due to swelling, however the pupil appears to be reactive and she is able to see light. There is no evidence on the CT scan for globe rupture or retrobulbar hematoma.  I've discussed the head CT findings with Dr. Benjamine Mola from ENT who does not feel as though any intervention is indicated. She will be placed in a compressive dressing and iced.  The other issue is that the patient lives by herself and is apparently unsafe to be at home. Her son is out of the country and has been in contact with his friend who is looking after his mother. He does not feel as though she is able to be at home by herself as she falls frequently and has been drinking. She will be evaluated by case management who will look into alternative placement  arrangements.  Veryl Speak, MD 07/17/16 [phone]

## 2016-07-16 NOTE — ED Notes (Signed)
MD at bedside. 

## 2016-07-16 NOTE — Care Management (Signed)
Friend of son was called by pateint's son to come to the ED to check on patient. He states, he is only a friend and he could not take her home to provide care for her, he left his number Hilliard Clark [phone]). CSW will continue to follow patient for a discharge plan.

## 2016-07-16 NOTE — ED Notes (Signed)
Friend of family # Kendra Hernandez [phone] Son # (txtn only) Kendra Hernandez [phone]

## 2016-07-16 NOTE — ED Notes (Signed)
Pt arrives from home via [facility].  EMS reports pt fell, injury noted to L eye.  EMS reports ETOH on board, pt AOx4 upon their arrival.  EMS reports giving 155mcg fentanyl.  Bleeding controlled, towel in place to stabilize C spine.

## 2016-07-16 NOTE — Progress Notes (Addendum)
CSW was notified by Nurse CM that patient presents today due to fall and has eye injury. Per note, patient was found today by a neighbor to have a small laceration and swollen eye. The patient has no recollection of falling an stays homes alone in [place].  Son reached out to [place]. He states that the patient has a care giver 2-3x a week. CSW asked son if he feels that patient is safe while she stays at home and does he feel she can live independently. Son states " If she does not drink, she can take care of herself".  Son states that he is the patient's primary support and that he does visit patient often. Son states that he lives in [place]. However, he states that at this time he is in [place] on vacation. Son states that feels patient should be placed or commited to a facility. CSW informed son that [facility] is not an appropriate setting for patient to stay, and that at this time it is not guaranteed placement will not be sought. Son was informed that [facility] is an outpatient setting.  CSW spoke with Nurse CM and pt is not being discharged tonight.  Willette Brace [id number] ED CSW 07/16/2016 10:43 PM

## 2016-07-16 NOTE — Care Management (Signed)
ED CM consulted, patient was seen at [facility] yesterday s/p unwitnessed fall. Patient presents to [facility] with facial injuries sustained in another fall. CM met with patient she reports she  lives alone and has a son who resides here in [place] but, he is currently out of the country in [place]. Patient has significant facial swelling above her left eye. The patient has no recollection of falling. [facility] reported to Nsg Staff  ETOH on board.  CSW consulted to follow up with patient for a  safe discharge plan.  Updated Dr. Stark Jock and [place] RN.

## 2016-07-17 DIAGNOSIS — E785 Hyperlipidemia, unspecified: Secondary | ICD-10-CM | POA: Diagnosis present

## 2016-07-17 DIAGNOSIS — E039 Hypothyroidism, unspecified: Secondary | ICD-10-CM | POA: Diagnosis present

## 2016-07-17 DIAGNOSIS — Z8673 Personal history of transient ischemic attack (TIA), and cerebral infarction without residual deficits: Secondary | ICD-10-CM | POA: Diagnosis not present

## 2016-07-17 DIAGNOSIS — Z7982 Long term (current) use of aspirin: Secondary | ICD-10-CM | POA: Diagnosis not present

## 2016-07-17 DIAGNOSIS — Z85828 Personal history of other malignant neoplasm of skin: Secondary | ICD-10-CM | POA: Diagnosis not present

## 2016-07-17 DIAGNOSIS — E876 Hypokalemia: Secondary | ICD-10-CM | POA: Diagnosis present

## 2016-07-17 DIAGNOSIS — F10239 Alcohol dependence with withdrawal, unspecified: Secondary | ICD-10-CM | POA: Diagnosis present

## 2016-07-17 DIAGNOSIS — F329 Major depressive disorder, single episode, unspecified: Secondary | ICD-10-CM | POA: Diagnosis present

## 2016-07-17 DIAGNOSIS — S0012XA Contusion of left eyelid and periocular area, initial encounter: Secondary | ICD-10-CM | POA: Diagnosis present

## 2016-07-17 DIAGNOSIS — K219 Gastro-esophageal reflux disease without esophagitis: Secondary | ICD-10-CM | POA: Diagnosis not present

## 2016-07-17 DIAGNOSIS — S0093XA Contusion of unspecified part of head, initial encounter: Secondary | ICD-10-CM

## 2016-07-17 DIAGNOSIS — R296 Repeated falls: Secondary | ICD-10-CM | POA: Diagnosis present

## 2016-07-17 DIAGNOSIS — I10 Essential (primary) hypertension: Secondary | ICD-10-CM | POA: Diagnosis not present

## 2016-07-17 DIAGNOSIS — J449 Chronic obstructive pulmonary disease, unspecified: Secondary | ICD-10-CM | POA: Diagnosis present

## 2016-07-17 DIAGNOSIS — Y906 Blood alcohol level of 120-199 mg/100 ml: Secondary | ICD-10-CM | POA: Diagnosis present

## 2016-07-17 DIAGNOSIS — W19XXXA Unspecified fall, initial encounter: Secondary | ICD-10-CM

## 2016-07-17 DIAGNOSIS — G4700 Insomnia, unspecified: Secondary | ICD-10-CM | POA: Diagnosis present

## 2016-07-17 DIAGNOSIS — F10939 Alcohol use, unspecified with withdrawal, unspecified: Secondary | ICD-10-CM | POA: Diagnosis present

## 2016-07-17 DIAGNOSIS — F17210 Nicotine dependence, cigarettes, uncomplicated: Secondary | ICD-10-CM | POA: Diagnosis present

## 2016-07-17 DIAGNOSIS — F10231 Alcohol dependence with withdrawal delirium: Secondary | ICD-10-CM | POA: Diagnosis not present

## 2016-07-17 DIAGNOSIS — F10230 Alcohol dependence with withdrawal, uncomplicated: Secondary | ICD-10-CM | POA: Diagnosis not present

## 2016-07-17 DIAGNOSIS — F1020 Alcohol dependence, uncomplicated: Secondary | ICD-10-CM | POA: Diagnosis not present

## 2016-07-17 MED ORDER — VENLAFAXINE HCL 50 MG PO TABS
100.0000 mg | ORAL_TABLET | Freq: Two times a day (BID) | ORAL | Status: DC
  Administered 2016-07-17 – 2016-07-20 (×7): 100 mg via ORAL
  Filled 2016-07-17 (×8): qty 2

## 2016-07-17 MED ORDER — ATORVASTATIN CALCIUM 10 MG PO TABS
10.0000 mg | ORAL_TABLET | Freq: Every day | ORAL | Status: DC
  Administered 2016-07-17 – 2016-07-20 (×4): 10 mg via ORAL
  Filled 2016-07-17 (×4): qty 1

## 2016-07-17 MED ORDER — LABETALOL HCL 200 MG PO TABS
200.0000 mg | ORAL_TABLET | Freq: Two times a day (BID) | ORAL | Status: DC
  Administered 2016-07-17 – 2016-07-21 (×9): 200 mg via ORAL
  Filled 2016-07-17 (×8): qty 1

## 2016-07-17 MED ORDER — KETOROLAC TROMETHAMINE 30 MG/ML IJ SOLN
30.0000 mg | Freq: Three times a day (TID) | INTRAMUSCULAR | Status: DC | PRN
  Administered 2016-07-18: 30 mg via INTRAVENOUS
  Filled 2016-07-17: qty 1

## 2016-07-17 MED ORDER — ONDANSETRON HCL 4 MG PO TABS: 4.0000 mg | ORAL_TABLET | Freq: Four times a day (QID) | ORAL | Status: DC | PRN

## 2016-07-17 MED ORDER — ONDANSETRON HCL 4 MG/2ML IJ SOLN: 4.0000 mg | Freq: Four times a day (QID) | INTRAMUSCULAR | Status: DC | PRN

## 2016-07-17 MED ORDER — BACLOFEN 10 MG PO TABS
10.0000 mg | ORAL_TABLET | Freq: Three times a day (TID) | ORAL | Status: DC
  Administered 2016-07-17 – 2016-07-21 (×12): 10 mg via ORAL
  Filled 2016-07-17 (×12): qty 1

## 2016-07-17 MED ORDER — SODIUM CHLORIDE 0.45 % IV SOLN
INTRAVENOUS | Status: DC
  Administered 2016-07-17 (×2): via INTRAVENOUS

## 2016-07-17 MED ORDER — OXYCODONE HCL 5 MG PO TABS
5.0000 mg | ORAL_TABLET | Freq: Four times a day (QID) | ORAL | Status: DC | PRN
  Administered 2016-07-18 – 2016-07-21 (×4): 5 mg via ORAL
  Filled 2016-07-17 (×5): qty 1

## 2016-07-17 MED ORDER — HYDRALAZINE HCL 50 MG PO TABS
50.0000 mg | ORAL_TABLET | Freq: Two times a day (BID) | ORAL | Status: DC
  Administered 2016-07-17 – 2016-07-21 (×9): 50 mg via ORAL
  Filled 2016-07-17 (×9): qty 1

## 2016-07-17 MED ORDER — ACETAMINOPHEN 500 MG PO TABS
1000.0000 mg | ORAL_TABLET | Freq: Four times a day (QID) | ORAL | Status: DC | PRN
  Administered 2016-07-17 – 2016-07-21 (×3): 1000 mg via ORAL
  Filled 2016-07-17 (×3): qty 2

## 2016-07-17 MED ORDER — TRAZODONE HCL 100 MG PO TABS
200.0000 mg | ORAL_TABLET | Freq: Every day | ORAL | Status: DC
  Administered 2016-07-17 – 2016-07-19 (×3): 200 mg via ORAL
  Filled 2016-07-17 (×3): qty 2

## 2016-07-17 MED ORDER — ASPIRIN EC 81 MG PO TBEC
81.0000 mg | DELAYED_RELEASE_TABLET | Freq: Every day | ORAL | Status: DC
  Administered 2016-07-17 – 2016-07-21 (×5): 81 mg via ORAL
  Filled 2016-07-17 (×5): qty 1

## 2016-07-17 MED ORDER — SODIUM CHLORIDE 0.9% FLUSH
3.0000 mL | Freq: Two times a day (BID) | INTRAVENOUS | Status: DC
  Administered 2016-07-17 – 2016-07-20 (×3): 3 mL via INTRAVENOUS

## 2016-07-17 MED ORDER — PANTOPRAZOLE SODIUM 40 MG PO TBEC
80.0000 mg | DELAYED_RELEASE_TABLET | Freq: Every day | ORAL | Status: DC
  Administered 2016-07-17 – 2016-07-21 (×5): 80 mg via ORAL
  Filled 2016-07-17 (×6): qty 2

## 2016-07-17 MED ORDER — LEVOTHYROXINE SODIUM 50 MCG PO TABS
50.0000 ug | ORAL_TABLET | Freq: Every day | ORAL | Status: DC
  Administered 2016-07-18 – 2016-07-21 (×4): 50 ug via ORAL
  Filled 2016-07-17 (×4): qty 1

## 2016-07-17 NOTE — ED Provider Notes (Signed)
Pt is a 75 yo wf who was observed overnight in pod C for anticipated placement today.  The pt does have a hx of EtOH abuse and has been requiring multiple doses of IV ativan.  CIWA of 15.  She has also fallen and has been requiring IV pain meds.  The pt is now withdrawing from alcohol and pain is not controlled.  I think the pt needs admission, so I will speak to the hospitalists.  I spoke to Dr. Linna Darner from [facility] who will admit pt.  Isla Pence, MD 07/17/16 [phone]

## 2016-07-17 NOTE — ED Notes (Signed)
Per pt request this RN spoke with the Financial risk analyst at [facility] who agrees to call Kendra Hernandez, a Designer, industrial/product, who the pt requested to be contacted to help take care of her dog while she is in the hospital, this RN provided my contact phone number for Amber to reach me at if she is unable to find someone to help with the pts dog, pt updated

## 2016-07-17 NOTE — ED Notes (Signed)
Placed pt on bed pan.

## 2016-07-17 NOTE — Evaluation (Signed)
Clinical/Bedside Swallow Evaluation Patient Details  Name: Kendra Hernandez MRN: [id number] Date of Birth: 07-Jun-1941  Today's Date: 07/17/2016 Time: SLP Start Time (ACUTE ONLY): 1055 SLP Stop Time (ACUTE ONLY): 1109 SLP Time Calculation (min) (ACUTE ONLY): 14 min  Past Medical History:  Past Medical History  Diagnosis Date  . Stroke (cerebrum) ([place])   . Repeated falls   . Hypertension   . Hyperlipidemia   . Insomnia   . Hypothyroid   . Reflux   . Anxiety   . Stress   . Skin cancer   . Arthritis   . COPD (chronic obstructive pulmonary disease) ([place])    Past Surgical History:  Past Surgical History  Procedure Laterality Date  . Toe surgery Right     Removal of toe   . Mandible fracture surgery      Placement of partial jaw due to abcess  . Skin cancer excision      Removal of several skin cancers  . Colonoscopy      With polyp removal    HPI:  Patient is a 75 year old female with history of prior CVA, hypothyroidism, high cholesterol area to presents for evaluation of facial injuries sustained in a fall. The patient was apparently found by a neighbor today to have a small laceration and swollen area above her left eye.   Assessment / Plan / Recommendation Clinical Impression  Pt demonstrates adequate tolerance of regular solids and thin liquids. She did have a thraot clear with initial sip, but no further difficulty over the rest of the assessment. Pt may continue regular diet and thin liquids. No SLP f/u needed, will sign off.     Aspiration Risk  Mild aspiration risk    Diet Recommendation Regular;Thin liquid   Liquid Administration via: Cup;Straw Medication Administration: Whole meds with liquid Supervision: Patient able to self feed Compensations: Slow rate;Small sips/bites Postural Changes: Seated upright at 90 degrees      Swallow Study   General HPI: Patient is a 75 year old female with history of prior CVA, hypothyroidism, high cholesterol area to presents  for evaluation of facial injuries sustained in a fall. The patient was apparently found by a neighbor today to have a small laceration and swollen area above her left eye. Type of Study: Bedside Swallow Evaluation Previous Swallow Assessment: Esophagram 2014 - hiatal hernia, tertiary contractions, prominent CP Diet Prior to this Study: Regular;Thin liquids Temperature Spikes Noted: No Respiratory Status: Nasal cannula History of Recent Intubation: No Behavior/Cognition: Alert;Requires cueing Oral Cavity Assessment: Within Functional Limits Oral Care Completed by SLP: No Oral Cavity - Dentition: Adequate natural dentition Vision: Impaired for self-feeding Self-Feeding Abilities: Needs assist Patient Positioning: Upright in bed Baseline Vocal Quality: Normal Volitional Cough: Strong Volitional Swallow: Able to elicit    Oral/Motor/Sensory Function Overall Oral Motor/Sensory Function: Mild impairment Facial ROM: Reduced left (edema on left side of face) Facial Symmetry: Within Functional Limits Facial Strength: Within Functional Limits   Ice Chips     Thin Liquid Thin Liquid: Within functional limits    Nectar Thick Nectar Thick Liquid: Not tested   Honey Thick Honey Thick Liquid: Not tested   Puree Puree: Within functional limits   Solid   GO   Solid: Within functional limits    Functional Assessment Tool Used: clinical judgement Functional Limitations: Swallowing Swallow Current Status [id number]): 0 percent impaired, limited or restricted Swallow Goal Status [id number]): 0 percent impaired, limited or restricted Swallow Discharge Status [phone]): 0 percent impaired, limited or restricted  [employer],  MA CCC-SLP [id number]  Lynann Beaver 07/17/2016,11:14 AM

## 2016-07-17 NOTE — ED Notes (Signed)
THis RN cleaned dried blood off the pts neck & repositioned her in bed, pt A&O x4, pt concerned re:  Her dog at home, this RN will attempt to reach the apt office per pt request to see if someone can help with the dog, will update the pt

## 2016-07-17 NOTE — H&P (Signed)
History and Physical    Kendra Hernandez [id number] DOB: Oct 11, 1941 DOA: 07/16/2016  PCP: [name], MD Patient coming from: Home  Chief Complaint: Fall and head pain  HPI: Kendra Hernandez is a 75 y.o. female with medical history significant of CVA, repeated falls, HTN, HLD, insomnia, hypothyroidism, GERD, COPD, EtOH abuse.  5 caveat applies. History provided on a limited basis by the patient due to her somnolent state after head trauma and Ativan due to alcohol withdrawal. Patient will wake up for short period of time and answer questions appropriately but only able to obtain very limited information. Information provided by EDP, EMS, and nursing staff.  Patient was brought to [facility] ED on 07/16/2016 after sustaining multiple falls. Patient was initially found by a neighbor lying on the ground with a laceration to the left side of her head. Patient has no recollection of the fall. She was by herself. Patient does endorse drinking on the day of the injury. Of note patient was seen the day prior to that ED visit with a similar presentation, fall. At that time patient had fallen and struck the back of her head. At time of my evaluation on 07/17/2016 patient endorses daily alcohol intake of a half a beer per day but states that she may have had more than that on the day of her fall and subsequent head injury.  ED Course: Patient has had a lengthy course in the emergency room extending over an entire day. ENT was initially consult, and evaluate the patient for admission. Imaging showed that there only soft tissue injuries to the patient's head and there is no evidence of intracranial hemorrhaging or facial fractures. ENT deferred admission at that time and recommended a compression dressing with ice.   There are significant psychosocial problems as the day wore on this patient was unsafe to be able to go home. Patient's next of kin, her son is currently on vacation out of the country and is  unable to provide additional assistance for the patient. Per patient's son the patient is safe and able to live at home when she is not drinking alcohol however she has proven to be incapable of this over the last several days due to multiple falls and elevated blood alcohol levels. Patient stated the emergency room overnight. Over the course of the night patient began to go through alcohol withdrawal with significant agitation and tremor and elevated heart rate and blood pressure. Patient started on Cipro protocol and given Ativan with improvement in symptoms. At this point time patient continues to be somewhat tremulous and agitated. Additional objective findings are outlined below.  Review of Systems: As per HPI otherwise 10 point review of systems negative.   Ambulatory Status: Multiple recent falls felt in large part to be due to alcohol intoxication.  Past Medical History  Diagnosis Date  . Stroke (cerebrum) ([place])   . Repeated falls   . Hypertension   . Hyperlipidemia   . Insomnia   . Hypothyroid   . Reflux   . Anxiety   . Stress   . Skin cancer   . Arthritis   . COPD (chronic obstructive pulmonary disease) [facility])     Past Surgical History  Procedure Laterality Date  . Toe surgery Right     Removal of toe   . Mandible fracture surgery      Placement of partial jaw due to abcess  . Skin cancer excision      Removal of several skin cancers  .  Colonoscopy      With polyp removal     Social History   Social History  . Marital Status: Divorced    Spouse Name: N/A  . Number of Children: 2  . Years of Education: 13   Occupational History  . Retired    Social History Main Topics  . Smoking status: Current Every Day Smoker -- 1.00 packs/day for 30 years    Types: Cigarettes  . Smokeless tobacco: Not on file  . Alcohol Use: 0.0 oz/week    0 Standard drinks or equivalent per week  . Drug Use: No  . Sexual Activity: Not on file   Other Topics Concern  . Not on file    Social History Narrative   Right-handed.   Currently in stroke rehab at [facility].     Occasional caffeine use.    Allergies  Allergen Reactions  . Ceftin [Cefuroxime Axetil]   . Penicillins Hives    Hives and swelling as a child   . Simvastatin     Patient can not recall side effect     Family History  Problem Relation Age of Onset  . Emphysema Brother   . Emphysema Mother   . Heart attack Father   . Rheumatologic disease Mother   . Pancreatic cancer Daughter     Prior to Admission medications   Medication Sig Start Date End Date Taking? Authorizing Provider  acetaminophen (TYLENOL ARTHRITIS PAIN) 650 MG CR tablet Take 650 mg by mouth every 8 (eight) hours as needed for pain.    Historical Provider, MD  aspirin EC 81 MG tablet Take 81 mg by mouth daily. With breakfast    Historical Provider, MD  atorvastatin (LIPITOR) 10 MG tablet TK 1 T PO QD 06/30/16   Historical Provider, MD  baclofen (LIORESAL) 10 MG tablet Take 10 mg by mouth 3 (three) times daily.    Historical Provider, MD  hydrALAZINE (APRESOLINE) 50 MG tablet Take 50 mg by mouth 2 (two) times daily.    Historical Provider, MD  labetalol (NORMODYNE) 200 MG tablet Take 200 mg by mouth 2 (two) times daily.    Historical Provider, MD  levothyroxine (SYNTHROID, LEVOTHROID) 50 MCG tablet Take 50 mcg by mouth daily before breakfast.    Historical Provider, MD  loratadine (CLARITIN) 10 MG tablet Take 10 mg by mouth daily.    Historical Provider, MD  omeprazole (PRILOSEC) 40 MG capsule Take 40 mg by mouth daily.    Historical Provider, MD  sucralfate (CARAFATE) 1 g tablet Take 1 g by mouth 4 (four) times daily -  with meals and at bedtime.    Historical Provider, MD  traZODone (DESYREL) 100 MG tablet Take 200 mg by mouth at bedtime. 2 by mouth every evening    Historical Provider, MD  venlafaxine (EFFEXOR) 100 MG tablet Take 100 mg by mouth 2 (two) times daily.    Historical Provider, MD    Physical Exam: Filed Vitals:    07/17/16 0908 07/17/16 1000 07/17/16 1038 07/17/16 1042  BP: 157/76 191/90 176/91   Pulse: 108 121 109   Temp:   98.6 F (37 C)   TempSrc:   Oral   Resp: 12 23 19    Weight:    50.3 kg (110 lb 14.3 oz)  SpO2: 97% 96% 97%      General: Resting in bed, sleeping, wakes briefly to answer questions. Eyes: R eye visualized onlyt due to pressure dressing over the L side of the head and  face. Pupil reactive, EOMI, normal lids,  ENT:  grossly normal hearing, tongue, mmm Neck: no LAD, masses or thyromegaly Cardiovascular:  RRR, III/VI systolic murmur. No LE edema.  Respiratory:  CTA bilaterally, no w/r/r. Normal respiratory effort. Abdomen:  soft, ntnd, NABS Skin: Significant left facial abrasions and edema and ecchymoses with pressure dressing in place.  Musculoskeletal:  grossly normal tone BUE/BLE, good ROM, no bony abnormality Psychiatric: Difficult to fully ascertain due to patient being heavily medicated. Somnolent. She pushes appropriately. Neurologic: Again difficult to fully assess due to patient being heavily medicated. Results from these in coordinated fashion. Sensation intact.  Labs on Admission: I have personally reviewed following labs and imaging studies  CBC:  Recent Labs Lab 07/15/16 1751 07/16/16 1850  WBC 6.3 9.5  NEUTROABS 4.0 7.0  HGB 13.0 13.4  HCT 38.9 40.9  MCV 93.5 94.7  PLT 184 [id number]   Basic Metabolic Panel:  Recent Labs Lab 07/15/16 1751 07/16/16 1850  NA 141 143  K 3.5 3.6  CL 109 108  CO2 23 23  GLUCOSE 92 104*  BUN 13 9  CREATININE 0.64 0.49  CALCIUM 9.2 8.7*   GFR: Estimated Creatinine Clearance: 49.7 mL/min (by C-G formula based on Cr of 0.49). Liver Function Tests:  Recent Labs Lab 07/15/16 1751 07/16/16 1850  AST 18 21  ALT 13* 14  ALKPHOS 53 66  BILITOT 0.4 0.4  PROT 6.8 6.5  ALBUMIN 4.2 4.2   No results for input(s): LIPASE, AMYLASE in the last 168 hours. No results for input(s): AMMONIA in the last 168  hours. Coagulation Profile:  Recent Labs Lab 07/16/16 1850  INR 1.16   Cardiac Enzymes: No results for input(s): CKTOTAL, CKMB, CKMBINDEX, TROPONINI in the last 168 hours. BNP (last 3 results) No results for input(s): PROBNP in the last 8760 hours. HbA1C: No results for input(s): HGBA1C in the last 72 hours. CBG: No results for input(s): GLUCAP in the last 168 hours. Lipid Profile: No results for input(s): CHOL, HDL, LDLCALC, TRIG, CHOLHDL, LDLDIRECT in the last 72 hours. Thyroid Function Tests: No results for input(s): TSH, T4TOTAL, FREET4, T3FREE, THYROIDAB in the last 72 hours. Anemia Panel: No results for input(s): VITAMINB12, FOLATE, FERRITIN, TIBC, IRON, RETICCTPCT in the last 72 hours. Urine analysis:    Component Value Date/Time   COLORURINE YELLOW 07/15/2016 1804   APPEARANCEUR CLEAR 07/15/2016 1804   LABSPEC 1.005 07/15/2016 1804   PHURINE 6.0 07/15/2016 1804   GLUCOSEU NEGATIVE 07/15/2016 1804   HGBUR NEGATIVE 07/15/2016 1804   BILIRUBINUR NEGATIVE 07/15/2016 1804   KETONESUR NEGATIVE 07/15/2016 1804   PROTEINUR NEGATIVE 07/15/2016 1804   NITRITE NEGATIVE 07/15/2016 1804   LEUKOCYTESUR NEGATIVE 07/15/2016 1804    Creatinine Clearance: Estimated Creatinine Clearance: 49.7 mL/min (by C-G formula based on Cr of 0.49).  Sepsis Labs: @LABRCNTIP (procalcitonin:4,lacticidven:4) )No results found for this or any previous visit (from the past 240 hour(s)).   Radiological Exams on Admission: Dg Shoulder Right  07/16/2016  CLINICAL DATA:  Pain following fall EXAM: RIGHT SHOULDER - 2+ VIEW COMPARISON:  None. FINDINGS: Frontal and Y scapular views were obtained. There is no acute fracture or dislocation. There is osteoarthritic change in the glenohumeral joint and to a lesser extent in the acromioclavicular joint. No erosive change or intra-articular calcification. Visualized right is clear. IMPRESSION: Areas of osteoarthritic change.  No fracture or dislocation.  Electronically Signed   By: Lowella Grip III M.D.   On: 07/16/2016 17:56   Ct Head Wo Contrast  07/16/2016  CLINICAL DATA:  Pain following fall after episode of dizziness. EXAM: CT HEAD WITHOUT CONTRAST CT MAXILLOFACIAL WITHOUT CONTRAST CT CERVICAL SPINE WITHOUT CONTRAST TECHNIQUE: Multidetector CT imaging of the head, cervical spine, and maxillofacial structures were performed using the standard protocol without intravenous contrast. Multiplanar CT image reconstructions of the cervical spine and maxillofacial structures were also generated. COMPARISON:  Head CT in knee July 15, 2016 FINDINGS: CT HEAD FINDINGS Mild diffuse atrophy is stable. There is no intracranial mass, hemorrhage, extra-axial fluid collection, or midline shift. There is evidence of an old infarct involving much of the right temporal lobe as well as portions of posterior right lentiform nucleus and portions of the right internal capsule. Prior infarct also involves a portion of the lateral aspect of the anterior right occipital lobe. No acute infarct is evident. There is calcification in the periphery of the M2 segment of the right middle cerebral artery, likely a chronic thrombus. There are foci of calcification in each distal vertebral artery as well as in the carotid siphon regions bilaterally. No hyperdense vessel is currently evident. The bony calvarium appears intact. There is a large hematoma overlying anterior frontal bone and superior and lateral to the orbit on the left. This hematoma measures 9.6 x 5.6 x 4.0 cm. There is extensive preseptal soft tissue edema over the left orbit as well. Mastoid air cells are clear except for a few opacified mastoid air cells on the left inferiorly. CT MAXILLOFACIAL FINDINGS There is evidence of prior surgical fixation in the mandible on both right and left sides. There is extensive soft tissue edema over the preseptal left orbit as well as over the left frontal bone anteriorly as noted in the  head CT report above. There is no acute fracture or dislocation on the current examination. There is mucosal thickening in several ethmoid air cells bilaterally. Other paranasal sinuses are clear. There is no air-fluid level. No bony destruction or expansion. Ostiomeatal unit complexes are patent bilaterally. There is mild leftward deviation of the nasal septum. There is no nares obstruction on either side. There is no turbinate edema evident. Although not fractured, there is anterior subluxation of the right mandibular condyle with questionable dislocation of the right mandibular condyle. The left mandibular condyle is positioned within the temporal eminence. No other dislocation or subluxation evident. No adenopathy is appreciable. Visualized salivary glands appear symmetric bilaterally. There are multiple foci of carotid artery calcification bilaterally. CT CERVICAL SPINE FINDINGS There is no demonstrable fracture or spondylolisthesis. There is cervical levoscoliosis. Prevertebral soft tissues and predental space regions are normal. There is moderate disc space narrowing C5-6 and C6-7. There is milder disc space narrowing at C4-5. There is multilevel facet hypertrophy. Exit foraminal narrowing due to bony hypertrophy is noted at multiple levels. No high-grade stenosis evident. There are multiple foci of carotid artery calcification bilaterally. IMPRESSION: There are multiple foci of carotid artery calcification. CT head: Extensive soft tissue hematoma over the left frontal bone and preseptal left orbit. No calvarial fracture. Prior infarcts involving much of the right temporal lobe as well as portions of the right lentiform nucleus and right internal capsule. Infarct also extends into the anterior right occipital lobe. These infarcts are stable. No acute infarct is evident. No intracranial mass, hemorrhage, or extra-axial fluid collection. Calcification in the M2 segment of the right middle cerebral artery likely  represents prior thrombus. Several inferior mastoid air cells on the left are opacified. Most the mastoids bilaterally are clear. CT maxillofacial: Old trauma involving the  mandible with surgical fixation. Subluxation with possible dislocation of the right mandibular condyles with respect the right temporal eminence. Extensive soft tissue swelling over the preseptal left orbit left frontal bone anteriorly without fracture in this region. No intraorbital lesions are evident. There is mild mucosal thickening in several ethmoid air cells. Other paranasal sinuses are clear. No air-fluid level. No bony destruction or expansion. Ostiomeatal unit complexes patent bilaterally. Mild leftward deviation nasal septum. CT cervical spine: No fracture or spondylolisthesis. Multilevel arthropathy. Foci of carotid artery calcification noted. Electronically Signed   By: Lowella Grip III M.D.   On: 07/16/2016 19:09   Ct Head Wo Contrast  07/15/2016  CLINICAL DATA:  Dizziness leading to fall. No loss of consciousness. Abrasion to posterior head. EXAM: CT HEAD WITHOUT CONTRAST TECHNIQUE: Contiguous axial images were obtained from the base of the skull through the vertex without intravenous contrast. COMPARISON:  None. FINDINGS: Brain: No intracranial hemorrhage, mass effect, or midline shift. Remote lacunar infarcts in the right basal ganglia and encephalomalacia in the right temporal occipital lobe likely a sequela of prior MCA distribution infarct. No hydrocephalus. The basilar cisterns are patent. No evidence of acute ischemia. No intracranial fluid collection. Vascular: No hyperdense vessel. Calcification but the distal right M1 branch may be sequela of prior thrombus. There is atherosclerosis of skullbase vasculature. Skull: Small right posterior scalp edema/hematoma. Calvarium is intact. Sinuses/Orbits: Included paranasal sinuses and mastoid air cells are well aerated. Other: None. IMPRESSION: 1.  No acute intracranial  abnormality. 2. Remote lacunar infarcts in the right basal ganglia and sequela of remote MCA distribution infarct. Electronically Signed   By: Jeb Levering M.D.   On: 07/15/2016 20:18   Ct Cervical Spine Wo Contrast  07/16/2016  CLINICAL DATA:  Pain following fall after episode of dizziness. EXAM: CT HEAD WITHOUT CONTRAST CT MAXILLOFACIAL WITHOUT CONTRAST CT CERVICAL SPINE WITHOUT CONTRAST TECHNIQUE: Multidetector CT imaging of the head, cervical spine, and maxillofacial structures were performed using the standard protocol without intravenous contrast. Multiplanar CT image reconstructions of the cervical spine and maxillofacial structures were also generated. COMPARISON:  Head CT in knee July 15, 2016 FINDINGS: CT HEAD FINDINGS Mild diffuse atrophy is stable. There is no intracranial mass, hemorrhage, extra-axial fluid collection, or midline shift. There is evidence of an old infarct involving much of the right temporal lobe as well as portions of posterior right lentiform nucleus and portions of the right internal capsule. Prior infarct also involves a portion of the lateral aspect of the anterior right occipital lobe. No acute infarct is evident. There is calcification in the periphery of the M2 segment of the right middle cerebral artery, likely a chronic thrombus. There are foci of calcification in each distal vertebral artery as well as in the carotid siphon regions bilaterally. No hyperdense vessel is currently evident. The bony calvarium appears intact. There is a large hematoma overlying anterior frontal bone and superior and lateral to the orbit on the left. This hematoma measures 9.6 x 5.6 x 4.0 cm. There is extensive preseptal soft tissue edema over the left orbit as well. Mastoid air cells are clear except for a few opacified mastoid air cells on the left inferiorly. CT MAXILLOFACIAL FINDINGS There is evidence of prior surgical fixation in the mandible on both right and left sides. There is  extensive soft tissue edema over the preseptal left orbit as well as over the left frontal bone anteriorly as noted in the head CT report above. There is no acute fracture or  dislocation on the current examination. There is mucosal thickening in several ethmoid air cells bilaterally. Other paranasal sinuses are clear. There is no air-fluid level. No bony destruction or expansion. Ostiomeatal unit complexes are patent bilaterally. There is mild leftward deviation of the nasal septum. There is no nares obstruction on either side. There is no turbinate edema evident. Although not fractured, there is anterior subluxation of the right mandibular condyle with questionable dislocation of the right mandibular condyle. The left mandibular condyle is positioned within the temporal eminence. No other dislocation or subluxation evident. No adenopathy is appreciable. Visualized salivary glands appear symmetric bilaterally. There are multiple foci of carotid artery calcification bilaterally. CT CERVICAL SPINE FINDINGS There is no demonstrable fracture or spondylolisthesis. There is cervical levoscoliosis. Prevertebral soft tissues and predental space regions are normal. There is moderate disc space narrowing C5-6 and C6-7. There is milder disc space narrowing at C4-5. There is multilevel facet hypertrophy. Exit foraminal narrowing due to bony hypertrophy is noted at multiple levels. No high-grade stenosis evident. There are multiple foci of carotid artery calcification bilaterally. IMPRESSION: There are multiple foci of carotid artery calcification. CT head: Extensive soft tissue hematoma over the left frontal bone and preseptal left orbit. No calvarial fracture. Prior infarcts involving much of the right temporal lobe as well as portions of the right lentiform nucleus and right internal capsule. Infarct also extends into the anterior right occipital lobe. These infarcts are stable. No acute infarct is evident. No intracranial  mass, hemorrhage, or extra-axial fluid collection. Calcification in the M2 segment of the right middle cerebral artery likely represents prior thrombus. Several inferior mastoid air cells on the left are opacified. Most the mastoids bilaterally are clear. CT maxillofacial: Old trauma involving the mandible with surgical fixation. Subluxation with possible dislocation of the right mandibular condyles with respect the right temporal eminence. Extensive soft tissue swelling over the preseptal left orbit left frontal bone anteriorly without fracture in this region. No intraorbital lesions are evident. There is mild mucosal thickening in several ethmoid air cells. Other paranasal sinuses are clear. No air-fluid level. No bony destruction or expansion. Ostiomeatal unit complexes patent bilaterally. Mild leftward deviation nasal septum. CT cervical spine: No fracture or spondylolisthesis. Multilevel arthropathy. Foci of carotid artery calcification noted. Electronically Signed   By: Lowella Grip III M.D.   On: 07/16/2016 19:09   Ct Maxillofacial Wo Contrast  07/16/2016  CLINICAL DATA:  Pain following fall after episode of dizziness. EXAM: CT HEAD WITHOUT CONTRAST CT MAXILLOFACIAL WITHOUT CONTRAST CT CERVICAL SPINE WITHOUT CONTRAST TECHNIQUE: Multidetector CT imaging of the head, cervical spine, and maxillofacial structures were performed using the standard protocol without intravenous contrast. Multiplanar CT image reconstructions of the cervical spine and maxillofacial structures were also generated. COMPARISON:  Head CT in knee July 15, 2016 FINDINGS: CT HEAD FINDINGS Mild diffuse atrophy is stable. There is no intracranial mass, hemorrhage, extra-axial fluid collection, or midline shift. There is evidence of an old infarct involving much of the right temporal lobe as well as portions of posterior right lentiform nucleus and portions of the right internal capsule. Prior infarct also involves a portion of the  lateral aspect of the anterior right occipital lobe. No acute infarct is evident. There is calcification in the periphery of the M2 segment of the right middle cerebral artery, likely a chronic thrombus. There are foci of calcification in each distal vertebral artery as well as in the carotid siphon regions bilaterally. No hyperdense vessel is currently evident. The bony  calvarium appears intact. There is a large hematoma overlying anterior frontal bone and superior and lateral to the orbit on the left. This hematoma measures 9.6 x 5.6 x 4.0 cm. There is extensive preseptal soft tissue edema over the left orbit as well. Mastoid air cells are clear except for a few opacified mastoid air cells on the left inferiorly. CT MAXILLOFACIAL FINDINGS There is evidence of prior surgical fixation in the mandible on both right and left sides. There is extensive soft tissue edema over the preseptal left orbit as well as over the left frontal bone anteriorly as noted in the head CT report above. There is no acute fracture or dislocation on the current examination. There is mucosal thickening in several ethmoid air cells bilaterally. Other paranasal sinuses are clear. There is no air-fluid level. No bony destruction or expansion. Ostiomeatal unit complexes are patent bilaterally. There is mild leftward deviation of the nasal septum. There is no nares obstruction on either side. There is no turbinate edema evident. Although not fractured, there is anterior subluxation of the right mandibular condyle with questionable dislocation of the right mandibular condyle. The left mandibular condyle is positioned within the temporal eminence. No other dislocation or subluxation evident. No adenopathy is appreciable. Visualized salivary glands appear symmetric bilaterally. There are multiple foci of carotid artery calcification bilaterally. CT CERVICAL SPINE FINDINGS There is no demonstrable fracture or spondylolisthesis. There is cervical  levoscoliosis. Prevertebral soft tissues and predental space regions are normal. There is moderate disc space narrowing C5-6 and C6-7. There is milder disc space narrowing at C4-5. There is multilevel facet hypertrophy. Exit foraminal narrowing due to bony hypertrophy is noted at multiple levels. No high-grade stenosis evident. There are multiple foci of carotid artery calcification bilaterally. IMPRESSION: There are multiple foci of carotid artery calcification. CT head: Extensive soft tissue hematoma over the left frontal bone and preseptal left orbit. No calvarial fracture. Prior infarcts involving much of the right temporal lobe as well as portions of the right lentiform nucleus and right internal capsule. Infarct also extends into the anterior right occipital lobe. These infarcts are stable. No acute infarct is evident. No intracranial mass, hemorrhage, or extra-axial fluid collection. Calcification in the M2 segment of the right middle cerebral artery likely represents prior thrombus. Several inferior mastoid air cells on the left are opacified. Most the mastoids bilaterally are clear. CT maxillofacial: Old trauma involving the mandible with surgical fixation. Subluxation with possible dislocation of the right mandibular condyles with respect the right temporal eminence. Extensive soft tissue swelling over the preseptal left orbit left frontal bone anteriorly without fracture in this region. No intraorbital lesions are evident. There is mild mucosal thickening in several ethmoid air cells. Other paranasal sinuses are clear. No air-fluid level. No bony destruction or expansion. Ostiomeatal unit complexes patent bilaterally. Mild leftward deviation nasal septum. CT cervical spine: No fracture or spondylolisthesis. Multilevel arthropathy. Foci of carotid artery calcification noted. Electronically Signed   By: Lowella Grip III M.D.   On: 07/16/2016 19:09      Assessment/Plan Active Problems:    Essential hypertension, benign   Esophageal reflux   Major depression, chronic (HCC)   Alcohol withdrawal (HCC)   Withdrawal symptoms, alcohol ([place])   Hypothyroidism   History of CVA (cerebrovascular accident)   Head contusion   Fall   Insomnia   Essential hypertension   ETOH withdrawal: Patient with EtOH abuse history. Endorses only drinking a half a can of beer per day though this story is  refuted by her son. Alcohol found that patient's home by EMS when she was picked up on the 18th. Patient in the ED since the 18th but developed tremor and agitation. He relieved with Ativan. - Telemetry - CIWA - UDS  Falls: Recurrent problem for patient typically associated with alcohol intoxication. Seen in ED on 717 and 718 for falls. 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 - PT/OT  Head contusion: Fortunately no bony fracture including orbit and no intracranial hemorrhaging. All injury simply too soft tissue structures on the left side of the head. Suspect patient likely also has a concussion given her foggy memory though this is clouded due to the EtOH use. Evaluated by Trauma and ENT who gave recommendations but opted not to follow as patient can be managed without their assistance.  - Continue compression dressing  - Pain medications as needed - Dressing change on 07/18/2016  HTN: - continue hydralazine, labetalol  Hypothyroid: - conitnue synthroid  GERD: - continue ppi  Insomnia: - continue trazodone  Depression: - continue effexor   HLD: - continue statin  MSK pain:  - continue baclofen      DVT prophylaxis: SCD  Code Status: FULL  Family Communication:  none  Disposition Plan: pending improvement  Consults called: none  Admission status: inpt    Kendra Hernandez J MD [facility]  If 7PM-7AM, please contact night-coverage [email or web address] Password [email or web address]  07/17/2016, 11:40 AM

## 2016-07-17 NOTE — ED Notes (Addendum)
Patient requesting coffee to drink.  Explained that we are not giving her anything to drink at this time.  Stated she does not "get choked" when she drinks fluids.

## 2016-07-17 NOTE — ED Notes (Signed)
Patient moved from stretcher to bed, linens changed.  Face washed of dry blood.  Patient cooperative at this time.

## 2016-07-17 NOTE — Progress Notes (Signed)
Skin Assessment completed with Lambert Keto RN and second RN Carlyn Reichert. No skin issues on body. Pt has pressure dressing over left eye covering injury from fall.

## 2016-07-17 NOTE — Progress Notes (Signed)
Pt arrived from ER via BED and ED Tech. Pt placed on Telemetry and Admitting Doc paged. Admission Nurse notified. Pt has injury from fall to left eye with bandage.

## 2016-07-18 DIAGNOSIS — K219 Gastro-esophageal reflux disease without esophagitis: Secondary | ICD-10-CM

## 2016-07-18 DIAGNOSIS — I10 Essential (primary) hypertension: Secondary | ICD-10-CM

## 2016-07-18 LAB — BASIC METABOLIC PANEL
ANION GAP: 7 (ref 5–15)
BUN: 15 mg/dL (ref 6–20)
CALCIUM: 9.2 mg/dL (ref 8.9–10.3)
CHLORIDE: 102 mmol/L (ref 101–111)
CO2: 28 mmol/L (ref 22–32)
Creatinine, Ser: 0.55 mg/dL (ref 0.44–1.00)
GFR calc non Af Amer: >60 mL/min (ref >60)
Glucose, Bld: 107 mg/dL — ABNORMAL HIGH (ref 65–99)
Potassium: 3.3 mmol/L — ABNORMAL LOW (ref 3.5–5.1)
Sodium: 137 mmol/L (ref 135–145)

## 2016-07-18 LAB — CBC
HEMATOCRIT: 32.9 % — AB (ref 36.0–46.0)
HEMOGLOBIN: 10.7 g/dL — AB (ref 12.0–15.0)
MCH: 30.7 pg (ref 26.0–34.0)
MCHC: 32.5 g/dL (ref 30.0–36.0)
MCV: 94.5 fL (ref 78.0–100.0)
Platelets: 148 10*3/uL — ABNORMAL LOW (ref 150–400)
RBC: 3.48 MIL/uL — ABNORMAL LOW (ref 3.87–5.11)
RDW: 15.5 % (ref 11.5–15.5)
WBC: 5.2 10*3/uL (ref 4.0–10.5)

## 2016-07-18 LAB — MAGNESIUM: MAGNESIUM: 1.8 mg/dL (ref 1.7–2.4)

## 2016-07-18 MED ORDER — POTASSIUM CHLORIDE IN NACL 20-0.9 MEQ/L-% IV SOLN
INTRAVENOUS | Status: DC
  Administered 2016-07-18 – 2016-07-21 (×6): via INTRAVENOUS
  Filled 2016-07-18 (×11): qty 1000

## 2016-07-18 MED ORDER — HEPARIN SODIUM (PORCINE) 5000 UNIT/ML IJ SOLN
5000.0000 [IU] | Freq: Three times a day (TID) | INTRAMUSCULAR | Status: DC
  Administered 2016-07-18 – 2016-07-21 (×9): 5000 [IU] via SUBCUTANEOUS
  Filled 2016-07-18 (×7): qty 1

## 2016-07-18 MED ORDER — POTASSIUM CHLORIDE CRYS ER 20 MEQ PO TBCR
40.0000 meq | EXTENDED_RELEASE_TABLET | Freq: Once | ORAL | Status: AC
  Administered 2016-07-18: 40 meq via ORAL
  Filled 2016-07-18: qty 2

## 2016-07-18 MED ORDER — NICOTINE 14 MG/24HR TD PT24
14.0000 mg | MEDICATED_PATCH | Freq: Every day | TRANSDERMAL | Status: DC
  Administered 2016-07-18 – 2016-07-21 (×4): 14 mg via TRANSDERMAL
  Filled 2016-07-18 (×4): qty 1

## 2016-07-18 NOTE — Progress Notes (Addendum)
Paged and notified Dr. Allyson Sabal of the patient's kept asking to smoke. Verbal order thru phone given by Dr. Allyson Sabal to order Nicotine patch 14mg  daily. Refer to orders.

## 2016-07-18 NOTE — Care Management Important Message (Signed)
Important Message  Patient Details  Name: Kendra Hernandez MRN: [id number] Date of Birth: 1941/09/11   Medicare Important Message Given:  Yes    Loann Quill 07/18/2016, 1:38 PM

## 2016-07-18 NOTE — Progress Notes (Signed)
[facility] PROGRESS NOTE  Kendra Hernandez [id number] DOB: 1941-12-20 DOA: 07/16/2016   PCP: [name], MD     Assessment/Plan: Active Problems:   Essential hypertension, benign   Esophageal reflux   Major depression, chronic (HCC)   Alcohol withdrawal ([place])   Withdrawal symptoms, alcohol ([place])   Hypothyroidism   History of CVA (cerebrovascular accident)   Head contusion   Fall   Insomnia   Essential hypertension   75 y.o. female with medical history significant of CVA, repeated falls, HTN, HLD, insomnia, hypothyroidism, GERD, COPD, EtOH abuse., patient was found today by a neighbor to have a small laceration and swollen eye. The patient has no recollection of falling an stays homes alone in [place] and plan ETOH withdrawal: Patient with EtOH abuse history. Endorses only drinking a half a can of beer per day though this story is refuted by her son. Alcohol found that patient's home by EMS when she was picked up on the 18th. EtOH level was 172 on admission, now 164 Patient in the ED since the 18th but developed tremor and agitation. He relieved with Ativan. Admitted to telemetry to be monitored for alcohol withdrawal, currently on CIWA protocol UDS pending   Falls: Secondary to alcohol intoxication. Seen in ED on 717 and 718 for falls. 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 - PT/OT  Head contusion:Extensive soft tissue hematoma over the left frontal bone and preseptal left orbit. Fortunately no bony fracture including orbit and no intracranial hemorrhaging. All injury simply too soft tissue structures on the left side of the head. Suspect patient likely also has a concussion given her foggy memory though this is clouded due to the EtOH use. Evaluated by Trauma and ENT who gave recommendations but opted not to follow as patient can be managed without their assistance.  - Continue compression dressing  - Pain medications as  needed - Dressing change on 07/18/2016  Hypokalemia-replete and check magnesium levels  HTN: - continue hydralazine, labetalol  Hypothyroid: - conitnue synthroid  GERD: - continue ppi  Insomnia: - continue trazodone  Depression: - continue effexor  HLD: - continue statin  MSK pain:  - continue baclofen    DVT prophylaxsis heparin  Code Status:  Full code     Family Communication: Discussed in detail with the patient, all imaging results, lab results explained to the patient   Disposition Plan:  Anticipate dc in 3-4 days     Consultants:  None   Procedures:  None   Antibiotics: Anti-infectives    None         HPI/Subjective: Patient somewhat confused but complaining of pain in her left frontal area  Objective: Filed Vitals:   07/17/16 1328 07/17/16 1741 07/17/16 2037 07/18/16 0550  BP: 161/100 171/81 173/73 143/77  Pulse:  95 92 88  Temp:  99.1 F (37.3 C) 98.1 F (36.7 C) 98.3 F (36.8 C)  TempSrc:  Oral Oral Oral  Resp:  19 20 18   Weight:   52 kg (114 lb 10.2 oz)   SpO2:  98% 95% 93%    Intake/Output Summary (Last 24 hours) at 07/18/16 0859 Last data filed at 07/18/16 0713  Gross per 24 hour  Intake   1910 ml  Output     70 ml  Net   1840 ml    Exam:  Examination:   General: Resting in bed, sleeping, wakes briefly to answer questions.  Eyes: R eye  visualized onlyt due to pressure dressing over the L side of the head and face. Pupil reactive, EOMI, normal lids,   ENT: grossly normal hearing, tongue, mmm  Neck: no LAD, masses or thyromegaly  Cardiovascular: RRR, III/VI systolic murmur. No LE edema.   Respiratory: CTA bilaterally, no w/r/r. Normal respiratory effort.  Abdomen: soft, ntnd, NABS  Skin: Significant left facial abrasions and edema and ecchymoses with pressure dressing in place.   Musculoskeletal: grossly normal tone BUE/BLE, good ROM, no bony abnormality  Psychiatric: Difficult to fully  ascertain due to patient being heavily medicated. Somnolent. She pushes appropriately. Neurologic: Again difficult to fully assess due to patient being heavily medicated. Results from these in coordinated fashion. Sensation intact.   Data Reviewed: I have personally reviewed following labs and imaging studies  Micro Results No results found for this or any previous visit (from the past 240 hour(s)).  Radiology Reports Dg Shoulder Right  07/16/2016  CLINICAL DATA:  Pain following fall EXAM: RIGHT SHOULDER - 2+ VIEW COMPARISON:  None. FINDINGS: Frontal and Y scapular views were obtained. There is no acute fracture or dislocation. There is osteoarthritic change in the glenohumeral joint and to a lesser extent in the acromioclavicular joint. No erosive change or intra-articular calcification. Visualized right is clear. IMPRESSION: Areas of osteoarthritic change.  No fracture or dislocation. Electronically Signed   By: Lowella Grip III M.D.   On: 07/16/2016 17:56   Ct Head Wo Contrast  07/16/2016  CLINICAL DATA:  Pain following fall after episode of dizziness. EXAM: CT HEAD WITHOUT CONTRAST CT MAXILLOFACIAL WITHOUT CONTRAST CT CERVICAL SPINE WITHOUT CONTRAST TECHNIQUE: Multidetector CT imaging of the head, cervical spine, and maxillofacial structures were performed using the standard protocol without intravenous contrast. Multiplanar CT image reconstructions of the cervical spine and maxillofacial structures were also generated. COMPARISON:  Head CT in knee July 15, 2016 FINDINGS: CT HEAD FINDINGS Mild diffuse atrophy is stable. There is no intracranial mass, hemorrhage, extra-axial fluid collection, or midline shift. There is evidence of an old infarct involving much of the right temporal lobe as well as portions of posterior right lentiform nucleus and portions of the right internal capsule. Prior infarct also involves a portion of the lateral aspect of the anterior right occipital lobe. No acute  infarct is evident. There is calcification in the periphery of the M2 segment of the right middle cerebral artery, likely a chronic thrombus. There are foci of calcification in each distal vertebral artery as well as in the carotid siphon regions bilaterally. No hyperdense vessel is currently evident. The bony calvarium appears intact. There is a large hematoma overlying anterior frontal bone and superior and lateral to the orbit on the left. This hematoma measures 9.6 x 5.6 x 4.0 cm. There is extensive preseptal soft tissue edema over the left orbit as well. Mastoid air cells are clear except for a few opacified mastoid air cells on the left inferiorly. CT MAXILLOFACIAL FINDINGS There is evidence of prior surgical fixation in the mandible on both right and left sides. There is extensive soft tissue edema over the preseptal left orbit as well as over the left frontal bone anteriorly as noted in the head CT report above. There is no acute fracture or dislocation on the current examination. There is mucosal thickening in several ethmoid air cells bilaterally. Other paranasal sinuses are clear. There is no air-fluid level. No bony destruction or expansion. Ostiomeatal unit complexes are patent bilaterally. There is mild leftward deviation of the nasal septum.  There is no nares obstruction on either side. There is no turbinate edema evident. Although not fractured, there is anterior subluxation of the right mandibular condyle with questionable dislocation of the right mandibular condyle. The left mandibular condyle is positioned within the temporal eminence. No other dislocation or subluxation evident. No adenopathy is appreciable. Visualized salivary glands appear symmetric bilaterally. There are multiple foci of carotid artery calcification bilaterally. CT CERVICAL SPINE FINDINGS There is no demonstrable fracture or spondylolisthesis. There is cervical levoscoliosis. Prevertebral soft tissues and predental space  regions are normal. There is moderate disc space narrowing C5-6 and C6-7. There is milder disc space narrowing at C4-5. There is multilevel facet hypertrophy. Exit foraminal narrowing due to bony hypertrophy is noted at multiple levels. No high-grade stenosis evident. There are multiple foci of carotid artery calcification bilaterally. IMPRESSION: There are multiple foci of carotid artery calcification. CT head: Extensive soft tissue hematoma over the left frontal bone and preseptal left orbit. No calvarial fracture. Prior infarcts involving much of the right temporal lobe as well as portions of the right lentiform nucleus and right internal capsule. Infarct also extends into the anterior right occipital lobe. These infarcts are stable. No acute infarct is evident. No intracranial mass, hemorrhage, or extra-axial fluid collection. Calcification in the M2 segment of the right middle cerebral artery likely represents prior thrombus. Several inferior mastoid air cells on the left are opacified. Most the mastoids bilaterally are clear. CT maxillofacial: Old trauma involving the mandible with surgical fixation. Subluxation with possible dislocation of the right mandibular condyles with respect the right temporal eminence. Extensive soft tissue swelling over the preseptal left orbit left frontal bone anteriorly without fracture in this region. No intraorbital lesions are evident. There is mild mucosal thickening in several ethmoid air cells. Other paranasal sinuses are clear. No air-fluid level. No bony destruction or expansion. Ostiomeatal unit complexes patent bilaterally. Mild leftward deviation nasal septum. CT cervical spine: No fracture or spondylolisthesis. Multilevel arthropathy. Foci of carotid artery calcification noted. Electronically Signed   By: Lowella Grip III M.D.   On: 07/16/2016 19:09   Ct Head Wo Contrast  07/15/2016  CLINICAL DATA:  Dizziness leading to fall. No loss of consciousness. Abrasion  to posterior head. EXAM: CT HEAD WITHOUT CONTRAST TECHNIQUE: Contiguous axial images were obtained from the base of the skull through the vertex without intravenous contrast. COMPARISON:  None. FINDINGS: Brain: No intracranial hemorrhage, mass effect, or midline shift. Remote lacunar infarcts in the right basal ganglia and encephalomalacia in the right temporal occipital lobe likely a sequela of prior MCA distribution infarct. No hydrocephalus. The basilar cisterns are patent. No evidence of acute ischemia. No intracranial fluid collection. Vascular: No hyperdense vessel. Calcification but the distal right M1 branch may be sequela of prior thrombus. There is atherosclerosis of skullbase vasculature. Skull: Small right posterior scalp edema/hematoma. Calvarium is intact. Sinuses/Orbits: Included paranasal sinuses and mastoid air cells are well aerated. Other: None. IMPRESSION: 1.  No acute intracranial abnormality. 2. Remote lacunar infarcts in the right basal ganglia and sequela of remote MCA distribution infarct. Electronically Signed   By: Jeb Levering M.D.   On: 07/15/2016 20:18   Ct Cervical Spine Wo Contrast  07/16/2016  CLINICAL DATA:  Pain following fall after episode of dizziness. EXAM: CT HEAD WITHOUT CONTRAST CT MAXILLOFACIAL WITHOUT CONTRAST CT CERVICAL SPINE WITHOUT CONTRAST TECHNIQUE: Multidetector CT imaging of the head, cervical spine, and maxillofacial structures were performed using the standard protocol without intravenous contrast. Multiplanar CT image reconstructions  of the cervical spine and maxillofacial structures were also generated. COMPARISON:  Head CT in knee July 15, 2016 FINDINGS: CT HEAD FINDINGS Mild diffuse atrophy is stable. There is no intracranial mass, hemorrhage, extra-axial fluid collection, or midline shift. There is evidence of an old infarct involving much of the right temporal lobe as well as portions of posterior right lentiform nucleus and portions of the right  internal capsule. Prior infarct also involves a portion of the lateral aspect of the anterior right occipital lobe. No acute infarct is evident. There is calcification in the periphery of the M2 segment of the right middle cerebral artery, likely a chronic thrombus. There are foci of calcification in each distal vertebral artery as well as in the carotid siphon regions bilaterally. No hyperdense vessel is currently evident. The bony calvarium appears intact. There is a large hematoma overlying anterior frontal bone and superior and lateral to the orbit on the left. This hematoma measures 9.6 x 5.6 x 4.0 cm. There is extensive preseptal soft tissue edema over the left orbit as well. Mastoid air cells are clear except for a few opacified mastoid air cells on the left inferiorly. CT MAXILLOFACIAL FINDINGS There is evidence of prior surgical fixation in the mandible on both right and left sides. There is extensive soft tissue edema over the preseptal left orbit as well as over the left frontal bone anteriorly as noted in the head CT report above. There is no acute fracture or dislocation on the current examination. There is mucosal thickening in several ethmoid air cells bilaterally. Other paranasal sinuses are clear. There is no air-fluid level. No bony destruction or expansion. Ostiomeatal unit complexes are patent bilaterally. There is mild leftward deviation of the nasal septum. There is no nares obstruction on either side. There is no turbinate edema evident. Although not fractured, there is anterior subluxation of the right mandibular condyle with questionable dislocation of the right mandibular condyle. The left mandibular condyle is positioned within the temporal eminence. No other dislocation or subluxation evident. No adenopathy is appreciable. Visualized salivary glands appear symmetric bilaterally. There are multiple foci of carotid artery calcification bilaterally. CT CERVICAL SPINE FINDINGS There is no  demonstrable fracture or spondylolisthesis. There is cervical levoscoliosis. Prevertebral soft tissues and predental space regions are normal. There is moderate disc space narrowing C5-6 and C6-7. There is milder disc space narrowing at C4-5. There is multilevel facet hypertrophy. Exit foraminal narrowing due to bony hypertrophy is noted at multiple levels. No high-grade stenosis evident. There are multiple foci of carotid artery calcification bilaterally. IMPRESSION: There are multiple foci of carotid artery calcification. CT head: Extensive soft tissue hematoma over the left frontal bone and preseptal left orbit. No calvarial fracture. Prior infarcts involving much of the right temporal lobe as well as portions of the right lentiform nucleus and right internal capsule. Infarct also extends into the anterior right occipital lobe. These infarcts are stable. No acute infarct is evident. No intracranial mass, hemorrhage, or extra-axial fluid collection. Calcification in the M2 segment of the right middle cerebral artery likely represents prior thrombus. Several inferior mastoid air cells on the left are opacified. Most the mastoids bilaterally are clear. CT maxillofacial: Old trauma involving the mandible with surgical fixation. Subluxation with possible dislocation of the right mandibular condyles with respect the right temporal eminence. Extensive soft tissue swelling over the preseptal left orbit left frontal bone anteriorly without fracture in this region. No intraorbital lesions are evident. There is mild mucosal thickening in several  ethmoid air cells. Other paranasal sinuses are clear. No air-fluid level. No bony destruction or expansion. Ostiomeatal unit complexes patent bilaterally. Mild leftward deviation nasal septum. CT cervical spine: No fracture or spondylolisthesis. Multilevel arthropathy. Foci of carotid artery calcification noted. Electronically Signed   By: Lowella Grip III M.D.   On: 07/16/2016  19:09   Ct Maxillofacial Wo Contrast  07/16/2016  CLINICAL DATA:  Pain following fall after episode of dizziness. EXAM: CT HEAD WITHOUT CONTRAST CT MAXILLOFACIAL WITHOUT CONTRAST CT CERVICAL SPINE WITHOUT CONTRAST TECHNIQUE: Multidetector CT imaging of the head, cervical spine, and maxillofacial structures were performed using the standard protocol without intravenous contrast. Multiplanar CT image reconstructions of the cervical spine and maxillofacial structures were also generated. COMPARISON:  Head CT in knee July 15, 2016 FINDINGS: CT HEAD FINDINGS Mild diffuse atrophy is stable. There is no intracranial mass, hemorrhage, extra-axial fluid collection, or midline shift. There is evidence of an old infarct involving much of the right temporal lobe as well as portions of posterior right lentiform nucleus and portions of the right internal capsule. Prior infarct also involves a portion of the lateral aspect of the anterior right occipital lobe. No acute infarct is evident. There is calcification in the periphery of the M2 segment of the right middle cerebral artery, likely a chronic thrombus. There are foci of calcification in each distal vertebral artery as well as in the carotid siphon regions bilaterally. No hyperdense vessel is currently evident. The bony calvarium appears intact. There is a large hematoma overlying anterior frontal bone and superior and lateral to the orbit on the left. This hematoma measures 9.6 x 5.6 x 4.0 cm. There is extensive preseptal soft tissue edema over the left orbit as well. Mastoid air cells are clear except for a few opacified mastoid air cells on the left inferiorly. CT MAXILLOFACIAL FINDINGS There is evidence of prior surgical fixation in the mandible on both right and left sides. There is extensive soft tissue edema over the preseptal left orbit as well as over the left frontal bone anteriorly as noted in the head CT report above. There is no acute fracture or dislocation  on the current examination. There is mucosal thickening in several ethmoid air cells bilaterally. Other paranasal sinuses are clear. There is no air-fluid level. No bony destruction or expansion. Ostiomeatal unit complexes are patent bilaterally. There is mild leftward deviation of the nasal septum. There is no nares obstruction on either side. There is no turbinate edema evident. Although not fractured, there is anterior subluxation of the right mandibular condyle with questionable dislocation of the right mandibular condyle. The left mandibular condyle is positioned within the temporal eminence. No other dislocation or subluxation evident. No adenopathy is appreciable. Visualized salivary glands appear symmetric bilaterally. There are multiple foci of carotid artery calcification bilaterally. CT CERVICAL SPINE FINDINGS There is no demonstrable fracture or spondylolisthesis. There is cervical levoscoliosis. Prevertebral soft tissues and predental space regions are normal. There is moderate disc space narrowing C5-6 and C6-7. There is milder disc space narrowing at C4-5. There is multilevel facet hypertrophy. Exit foraminal narrowing due to bony hypertrophy is noted at multiple levels. No high-grade stenosis evident. There are multiple foci of carotid artery calcification bilaterally. IMPRESSION: There are multiple foci of carotid artery calcification. CT head: Extensive soft tissue hematoma over the left frontal bone and preseptal left orbit. No calvarial fracture. Prior infarcts involving much of the right temporal lobe as well as portions of the right lentiform nucleus and right  internal capsule. Infarct also extends into the anterior right occipital lobe. These infarcts are stable. No acute infarct is evident. No intracranial mass, hemorrhage, or extra-axial fluid collection. Calcification in the M2 segment of the right middle cerebral artery likely represents prior thrombus. Several inferior mastoid air cells  on the left are opacified. Most the mastoids bilaterally are clear. CT maxillofacial: Old trauma involving the mandible with surgical fixation. Subluxation with possible dislocation of the right mandibular condyles with respect the right temporal eminence. Extensive soft tissue swelling over the preseptal left orbit left frontal bone anteriorly without fracture in this region. No intraorbital lesions are evident. There is mild mucosal thickening in several ethmoid air cells. Other paranasal sinuses are clear. No air-fluid level. No bony destruction or expansion. Ostiomeatal unit complexes patent bilaterally. Mild leftward deviation nasal septum. CT cervical spine: No fracture or spondylolisthesis. Multilevel arthropathy. Foci of carotid artery calcification noted. Electronically Signed   By: Lowella Grip III M.D.   On: 07/16/2016 19:09     CBC  Recent Labs Lab 07/15/16 1751 07/16/16 1850 07/18/16 0652  WBC 6.3 9.5 5.2  HGB 13.0 13.4 10.7*  HCT 38.9 40.9 32.9*  PLT 184 186 148*  MCV 93.5 94.7 94.5  MCH 31.3 31.0 30.7  MCHC 33.4 32.8 32.5  RDW 15.8* 15.8* 15.5  LYMPHSABS 1.6 1.5  --   MONOABS 0.6 0.7  --   EOSABS 0.1 0.1  --   BASOSABS 0.0 0.2*  --     Chemistries   Recent Labs Lab 07/15/16 1751 07/16/16 1850 07/18/16 0652  NA 141 143 137  K 3.5 3.6 3.3*  CL 109 108 102  CO2 23 23 28   GLUCOSE 92 104* 107*  BUN 13 9 15   CREATININE 0.64 0.49 0.55  CALCIUM 9.2 8.7* 9.2  AST 18 21  --   ALT 13* 14  --   ALKPHOS 53 66  --   BILITOT 0.4 0.4  --    ------------------------------------------------------------------------------------------------------------------ estimated creatinine clearance is 51.4 mL/min (by C-G formula based on Cr of 0.55). ------------------------------------------------------------------------------------------------------------------ No results for input(s): HGBA1C in the last 72  hours. ------------------------------------------------------------------------------------------------------------------ No results for input(s): CHOL, HDL, LDLCALC, TRIG, CHOLHDL, LDLDIRECT in the last 72 hours. ------------------------------------------------------------------------------------------------------------------ No results for input(s): TSH, T4TOTAL, T3FREE, THYROIDAB in the last 72 hours.  Invalid input(s): FREET3 ------------------------------------------------------------------------------------------------------------------ No results for input(s): VITAMINB12, FOLATE, FERRITIN, TIBC, IRON, RETICCTPCT in the last 72 hours.  Coagulation profile  Recent Labs Lab 07/16/16 1850  INR 1.16    No results for input(s): DDIMER in the last 72 hours.  Cardiac Enzymes No results for input(s): CKMB, TROPONINI, MYOGLOBIN in the last 168 hours.  Invalid input(s): CK ------------------------------------------------------------------------------------------------------------------ Invalid input(s): POCBNP   CBG: No results for input(s): GLUCAP in the last 168 hours.     Studies: Dg Shoulder Right  07/16/2016  CLINICAL DATA:  Pain following fall EXAM: RIGHT SHOULDER - 2+ VIEW COMPARISON:  None. FINDINGS: Frontal and Y scapular views were obtained. There is no acute fracture or dislocation. There is osteoarthritic change in the glenohumeral joint and to a lesser extent in the acromioclavicular joint. No erosive change or intra-articular calcification. Visualized right is clear. IMPRESSION: Areas of osteoarthritic change.  No fracture or dislocation. Electronically Signed   By: Lowella Grip III M.D.   On: 07/16/2016 17:56   Ct Head Wo Contrast  07/16/2016  CLINICAL DATA:  Pain following fall after episode of dizziness. EXAM: CT HEAD WITHOUT CONTRAST CT MAXILLOFACIAL WITHOUT CONTRAST CT CERVICAL  SPINE WITHOUT CONTRAST TECHNIQUE: Multidetector CT imaging of the head, cervical  spine, and maxillofacial structures were performed using the standard protocol without intravenous contrast. Multiplanar CT image reconstructions of the cervical spine and maxillofacial structures were also generated. COMPARISON:  Head CT in knee July 15, 2016 FINDINGS: CT HEAD FINDINGS Mild diffuse atrophy is stable. There is no intracranial mass, hemorrhage, extra-axial fluid collection, or midline shift. There is evidence of an old infarct involving much of the right temporal lobe as well as portions of posterior right lentiform nucleus and portions of the right internal capsule. Prior infarct also involves a portion of the lateral aspect of the anterior right occipital lobe. No acute infarct is evident. There is calcification in the periphery of the M2 segment of the right middle cerebral artery, likely a chronic thrombus. There are foci of calcification in each distal vertebral artery as well as in the carotid siphon regions bilaterally. No hyperdense vessel is currently evident. The bony calvarium appears intact. There is a large hematoma overlying anterior frontal bone and superior and lateral to the orbit on the left. This hematoma measures 9.6 x 5.6 x 4.0 cm. There is extensive preseptal soft tissue edema over the left orbit as well. Mastoid air cells are clear except for a few opacified mastoid air cells on the left inferiorly. CT MAXILLOFACIAL FINDINGS There is evidence of prior surgical fixation in the mandible on both right and left sides. There is extensive soft tissue edema over the preseptal left orbit as well as over the left frontal bone anteriorly as noted in the head CT report above. There is no acute fracture or dislocation on the current examination. There is mucosal thickening in several ethmoid air cells bilaterally. Other paranasal sinuses are clear. There is no air-fluid level. No bony destruction or expansion. Ostiomeatal unit complexes are patent bilaterally. There is mild leftward  deviation of the nasal septum. There is no nares obstruction on either side. There is no turbinate edema evident. Although not fractured, there is anterior subluxation of the right mandibular condyle with questionable dislocation of the right mandibular condyle. The left mandibular condyle is positioned within the temporal eminence. No other dislocation or subluxation evident. No adenopathy is appreciable. Visualized salivary glands appear symmetric bilaterally. There are multiple foci of carotid artery calcification bilaterally. CT CERVICAL SPINE FINDINGS There is no demonstrable fracture or spondylolisthesis. There is cervical levoscoliosis. Prevertebral soft tissues and predental space regions are normal. There is moderate disc space narrowing C5-6 and C6-7. There is milder disc space narrowing at C4-5. There is multilevel facet hypertrophy. Exit foraminal narrowing due to bony hypertrophy is noted at multiple levels. No high-grade stenosis evident. There are multiple foci of carotid artery calcification bilaterally. IMPRESSION: There are multiple foci of carotid artery calcification. CT head: Extensive soft tissue hematoma over the left frontal bone and preseptal left orbit. No calvarial fracture. Prior infarcts involving much of the right temporal lobe as well as portions of the right lentiform nucleus and right internal capsule. Infarct also extends into the anterior right occipital lobe. These infarcts are stable. No acute infarct is evident. No intracranial mass, hemorrhage, or extra-axial fluid collection. Calcification in the M2 segment of the right middle cerebral artery likely represents prior thrombus. Several inferior mastoid air cells on the left are opacified. Most the mastoids bilaterally are clear. CT maxillofacial: Old trauma involving the mandible with surgical fixation. Subluxation with possible dislocation of the right mandibular condyles with respect the right temporal eminence. Extensive soft  tissue swelling over the preseptal left orbit left frontal bone anteriorly without fracture in this region. No intraorbital lesions are evident. There is mild mucosal thickening in several ethmoid air cells. Other paranasal sinuses are clear. No air-fluid level. No bony destruction or expansion. Ostiomeatal unit complexes patent bilaterally. Mild leftward deviation nasal septum. CT cervical spine: No fracture or spondylolisthesis. Multilevel arthropathy. Foci of carotid artery calcification noted. Electronically Signed   By: Lowella Grip III M.D.   On: 07/16/2016 19:09   Ct Cervical Spine Wo Contrast  07/16/2016  CLINICAL DATA:  Pain following fall after episode of dizziness. EXAM: CT HEAD WITHOUT CONTRAST CT MAXILLOFACIAL WITHOUT CONTRAST CT CERVICAL SPINE WITHOUT CONTRAST TECHNIQUE: Multidetector CT imaging of the head, cervical spine, and maxillofacial structures were performed using the standard protocol without intravenous contrast. Multiplanar CT image reconstructions of the cervical spine and maxillofacial structures were also generated. COMPARISON:  Head CT in knee July 15, 2016 FINDINGS: CT HEAD FINDINGS Mild diffuse atrophy is stable. There is no intracranial mass, hemorrhage, extra-axial fluid collection, or midline shift. There is evidence of an old infarct involving much of the right temporal lobe as well as portions of posterior right lentiform nucleus and portions of the right internal capsule. Prior infarct also involves a portion of the lateral aspect of the anterior right occipital lobe. No acute infarct is evident. There is calcification in the periphery of the M2 segment of the right middle cerebral artery, likely a chronic thrombus. There are foci of calcification in each distal vertebral artery as well as in the carotid siphon regions bilaterally. No hyperdense vessel is currently evident. The bony calvarium appears intact. There is a large hematoma overlying anterior frontal bone and  superior and lateral to the orbit on the left. This hematoma measures 9.6 x 5.6 x 4.0 cm. There is extensive preseptal soft tissue edema over the left orbit as well. Mastoid air cells are clear except for a few opacified mastoid air cells on the left inferiorly. CT MAXILLOFACIAL FINDINGS There is evidence of prior surgical fixation in the mandible on both right and left sides. There is extensive soft tissue edema over the preseptal left orbit as well as over the left frontal bone anteriorly as noted in the head CT report above. There is no acute fracture or dislocation on the current examination. There is mucosal thickening in several ethmoid air cells bilaterally. Other paranasal sinuses are clear. There is no air-fluid level. No bony destruction or expansion. Ostiomeatal unit complexes are patent bilaterally. There is mild leftward deviation of the nasal septum. There is no nares obstruction on either side. There is no turbinate edema evident. Although not fractured, there is anterior subluxation of the right mandibular condyle with questionable dislocation of the right mandibular condyle. The left mandibular condyle is positioned within the temporal eminence. No other dislocation or subluxation evident. No adenopathy is appreciable. Visualized salivary glands appear symmetric bilaterally. There are multiple foci of carotid artery calcification bilaterally. CT CERVICAL SPINE FINDINGS There is no demonstrable fracture or spondylolisthesis. There is cervical levoscoliosis. Prevertebral soft tissues and predental space regions are normal. There is moderate disc space narrowing C5-6 and C6-7. There is milder disc space narrowing at C4-5. There is multilevel facet hypertrophy. Exit foraminal narrowing due to bony hypertrophy is noted at multiple levels. No high-grade stenosis evident. There are multiple foci of carotid artery calcification bilaterally. IMPRESSION: There are multiple foci of carotid artery  calcification. CT head: Extensive soft tissue hematoma over the left  frontal bone and preseptal left orbit. No calvarial fracture. Prior infarcts involving much of the right temporal lobe as well as portions of the right lentiform nucleus and right internal capsule. Infarct also extends into the anterior right occipital lobe. These infarcts are stable. No acute infarct is evident. No intracranial mass, hemorrhage, or extra-axial fluid collection. Calcification in the M2 segment of the right middle cerebral artery likely represents prior thrombus. Several inferior mastoid air cells on the left are opacified. Most the mastoids bilaterally are clear. CT maxillofacial: Old trauma involving the mandible with surgical fixation. Subluxation with possible dislocation of the right mandibular condyles with respect the right temporal eminence. Extensive soft tissue swelling over the preseptal left orbit left frontal bone anteriorly without fracture in this region. No intraorbital lesions are evident. There is mild mucosal thickening in several ethmoid air cells. Other paranasal sinuses are clear. No air-fluid level. No bony destruction or expansion. Ostiomeatal unit complexes patent bilaterally. Mild leftward deviation nasal septum. CT cervical spine: No fracture or spondylolisthesis. Multilevel arthropathy. Foci of carotid artery calcification noted. Electronically Signed   By: Lowella Grip III M.D.   On: 07/16/2016 19:09   Ct Maxillofacial Wo Contrast  07/16/2016  CLINICAL DATA:  Pain following fall after episode of dizziness. EXAM: CT HEAD WITHOUT CONTRAST CT MAXILLOFACIAL WITHOUT CONTRAST CT CERVICAL SPINE WITHOUT CONTRAST TECHNIQUE: Multidetector CT imaging of the head, cervical spine, and maxillofacial structures were performed using the standard protocol without intravenous contrast. Multiplanar CT image reconstructions of the cervical spine and maxillofacial structures were also generated. COMPARISON:  Head CT  in knee July 15, 2016 FINDINGS: CT HEAD FINDINGS Mild diffuse atrophy is stable. There is no intracranial mass, hemorrhage, extra-axial fluid collection, or midline shift. There is evidence of an old infarct involving much of the right temporal lobe as well as portions of posterior right lentiform nucleus and portions of the right internal capsule. Prior infarct also involves a portion of the lateral aspect of the anterior right occipital lobe. No acute infarct is evident. There is calcification in the periphery of the M2 segment of the right middle cerebral artery, likely a chronic thrombus. There are foci of calcification in each distal vertebral artery as well as in the carotid siphon regions bilaterally. No hyperdense vessel is currently evident. The bony calvarium appears intact. There is a large hematoma overlying anterior frontal bone and superior and lateral to the orbit on the left. This hematoma measures 9.6 x 5.6 x 4.0 cm. There is extensive preseptal soft tissue edema over the left orbit as well. Mastoid air cells are clear except for a few opacified mastoid air cells on the left inferiorly. CT MAXILLOFACIAL FINDINGS There is evidence of prior surgical fixation in the mandible on both right and left sides. There is extensive soft tissue edema over the preseptal left orbit as well as over the left frontal bone anteriorly as noted in the head CT report above. There is no acute fracture or dislocation on the current examination. There is mucosal thickening in several ethmoid air cells bilaterally. Other paranasal sinuses are clear. There is no air-fluid level. No bony destruction or expansion. Ostiomeatal unit complexes are patent bilaterally. There is mild leftward deviation of the nasal septum. There is no nares obstruction on either side. There is no turbinate edema evident. Although not fractured, there is anterior subluxation of the right mandibular condyle with questionable dislocation of the right  mandibular condyle. The left mandibular condyle is positioned within the temporal eminence.  No other dislocation or subluxation evident. No adenopathy is appreciable. Visualized salivary glands appear symmetric bilaterally. There are multiple foci of carotid artery calcification bilaterally. CT CERVICAL SPINE FINDINGS There is no demonstrable fracture or spondylolisthesis. There is cervical levoscoliosis. Prevertebral soft tissues and predental space regions are normal. There is moderate disc space narrowing C5-6 and C6-7. There is milder disc space narrowing at C4-5. There is multilevel facet hypertrophy. Exit foraminal narrowing due to bony hypertrophy is noted at multiple levels. No high-grade stenosis evident. There are multiple foci of carotid artery calcification bilaterally. IMPRESSION: There are multiple foci of carotid artery calcification. CT head: Extensive soft tissue hematoma over the left frontal bone and preseptal left orbit. No calvarial fracture. Prior infarcts involving much of the right temporal lobe as well as portions of the right lentiform nucleus and right internal capsule. Infarct also extends into the anterior right occipital lobe. These infarcts are stable. No acute infarct is evident. No intracranial mass, hemorrhage, or extra-axial fluid collection. Calcification in the M2 segment of the right middle cerebral artery likely represents prior thrombus. Several inferior mastoid air cells on the left are opacified. Most the mastoids bilaterally are clear. CT maxillofacial: Old trauma involving the mandible with surgical fixation. Subluxation with possible dislocation of the right mandibular condyles with respect the right temporal eminence. Extensive soft tissue swelling over the preseptal left orbit left frontal bone anteriorly without fracture in this region. No intraorbital lesions are evident. There is mild mucosal thickening in several ethmoid air cells. Other paranasal sinuses are clear.  No air-fluid level. No bony destruction or expansion. Ostiomeatal unit complexes patent bilaterally. Mild leftward deviation nasal septum. CT cervical spine: No fracture or spondylolisthesis. Multilevel arthropathy. Foci of carotid artery calcification noted. Electronically Signed   By: Lowella Grip III M.D.   On: 07/16/2016 19:09      Lab Results  Component Value Date   HGBA1C 5.1 01/02/2016   Lab Results  Component Value Date   LDLCALC 201 01/02/2016   CREATININE 0.55 07/18/2016       Scheduled Meds: . aspirin EC  81 mg Oral Daily  . atorvastatin  10 mg Oral q1800  . baclofen  10 mg Oral TID  . folic acid  1 mg Oral Daily  . hydrALAZINE  50 mg Oral BID  . labetalol  200 mg Oral BID  . levothyroxine  50 mcg Oral QAC breakfast  . LORazepam  0-4 mg Intravenous Q6H   Followed by  . LORazepam  0-4 mg Intravenous Q12H  . multivitamin with minerals  1 tablet Oral Daily  . pantoprazole  80 mg Oral Daily  . sodium chloride flush  3 mL Intravenous Q12H  . thiamine  100 mg Oral Daily   Or  . thiamine  100 mg Intravenous Daily  . traZODone  200 mg Oral QHS  . venlafaxine  100 mg Oral BID   Continuous Infusions: . sodium chloride 75 mL/hr at 07/17/16 2153     LOS: 1 day    Time spent: >30 MINS    [facility]  [facility] Pager [phone]. If 7PM-7AM, please contact night-coverage at [email or web address], password [facility] 07/18/2016, 8:59 AM  LOS: 1 day

## 2016-07-18 NOTE — Evaluation (Signed)
Physical Therapy Evaluation Patient Details Name: Kendra Hernandez MRN: [id number] DOB: 1941/01/13 Today's Date: 07/18/2016   History of Present Illness  Patient is a 75 year old female with history of prior CVA, hypothyroidism, high cholesterol area to presents for evaluation of facial injuries sustained in a fall. The patient was apparently found by a neighbor today to have a small laceration and swollen area above her left eye.     Clinical Impression  Patient presents with problems listed below.  Will benefit from acute PT to maximize functional mobility prior to discharge.  Patient lives alone.  Today, required up to mod assist for standing balance.  Has decreased safety awareness.  Recommend SNF at d/c for continued therapy for mobility, balance, and safety.    Follow Up Recommendations SNF;Supervision/Assistance - 24 hour    Equipment Recommendations  None recommended by PT    Recommendations for Other Services       Precautions / Restrictions Precautions Precautions: Fall Precaution Comments: Mult falls pta Restrictions Weight Bearing Restrictions: No      Mobility  Bed Mobility Overal bed mobility: Needs Assistance Bed Mobility: Supine to Sit     Supine to sit: Supervision     General bed mobility comments: Patient in chair  Transfers Overall transfer level: Needs assistance Equipment used: 1 person hand held assist Transfers: Sit to/from Stand Sit to Stand: Min assist;Mod assist Stand pivot transfers: Min assist       General transfer comment: Verbal cues to wait until PT prepared lines, etc before standing - repeated cues.  Assist to move to standing.  Patient with poor balance in stance, with posterior lean.  Attempted to take steps and staggered to left.  Patient abruptly returned to sitting in chair.  Verbal cues for safety.  Ambulation/Gait                Stairs            Wheelchair Mobility    Modified Rankin (Stroke Patients Only)       Balance Overall balance assessment: Needs assistance;History of Falls         Standing balance support: Single extremity supported Standing balance-Leahy Scale: Poor Standing balance comment: UE support and assist to maintain standing balance.                             Pertinent Vitals/Pain Pain Assessment: 0-10 Pain Score: 8  Pain Location: Face, head, and neck Pain Descriptors / Indicators: Sore Pain Intervention(s): Monitored during session    Home Living Family/patient expects to be discharged to:: Private residence Living Arrangements: Alone Available Help at Discharge: Family;Available PRN/intermittently (Son [place] of country) Type of Home: Apartment Home Access: Stairs to enter Entrance Stairs-Rails: None Entrance Stairs-Number of Steps: 2 Home Layout: One level Home Equipment: Clinical cytogeneticist - 2 wheels;Cane - single point;Bedside commode      Prior Function Level of Independence: Independent               Hand Dominance        Extremity/Trunk Assessment   Upper Extremity Assessment: Defer to OT evaluation           Lower Extremity Assessment: Generalized weakness         Communication   Communication: No difficulties  Cognition Arousal/Alertness: Awake/alert Behavior During Therapy: Restless;Impulsive Overall Cognitive Status: Within Functional Limits for tasks assessed (Decreased safety awareness - ? baseline)  General Comments      Exercises        Assessment/Plan    PT Assessment Patient needs continued PT services  PT Diagnosis Difficulty walking;Abnormality of gait;Generalized weakness;Acute pain;Altered mental status   PT Problem List Decreased strength;Decreased activity tolerance;Decreased balance;Decreased mobility;Decreased coordination;Decreased cognition;Decreased knowledge of use of DME;Decreased safety awareness;Decreased skin integrity;Pain  PT Treatment  Interventions DME instruction;Gait training;Functional mobility training;Therapeutic activities;Balance training;Cognitive remediation;Patient/family education   PT Goals (Current goals can be found in the Care Plan section) Acute Rehab PT Goals Patient Stated Goal: home ASAP- she is worried about dog PT Goal Formulation: With patient Time For Goal Achievement: 07/25/16 Potential to Achieve Goals: Good    Frequency Min 3X/week   Barriers to discharge Decreased caregiver support Patient lives alone    Co-evaluation               End of Session Equipment Utilized During Treatment: Gait belt Activity Tolerance: Patient limited by fatigue;Patient limited by pain Patient left: in chair;with call bell/phone within reach;with chair alarm set;with nursing/sitter in room Nurse Communication: Mobility status         Time: 1526-1540 PT Time Calculation (min) (ACUTE ONLY): 14 min   Charges:   PT Evaluation $PT Eval Moderate Complexity: 1 Procedure     PT G CodesDespina Pole August 17, 2016, 4:11 PM Carita Pian. Sanjuana Kava, [place] Pager [phone]

## 2016-07-18 NOTE — Evaluation (Signed)
Occupational Therapy Evaluation Patient Details Name: Kendra Hernandez MRN: [id number] DOB: [birth year] Today's Date: 07/18/2016    History of Present Illness Patient is a 75 year old female with history of prior CVA, hypothyroidism, high cholesterol area to presents for evaluation of facial injuries sustained in a fall. The patient was apparently found by a neighbor today to have a small laceration and swollen area above her left eye.    Clinical Impression   Pt admitted with s/p fall . Pt currently with functional limitations due to the deficits listed below (see OT Problem List). Pt will benefit from skilled OT to increase their safety and independence with ADL and functional mobility for ADL to facilitate discharge to venue listed below.      Follow Up Recommendations  SNF;Home health OT;Other (comment);Supervision/Assistance - 24 hour (depending on progress)    Equipment Recommendations  None recommended by OT       Precautions / Restrictions Precautions Precautions: Fall Restrictions Weight Bearing Restrictions: No      Mobility Bed Mobility Overal bed mobility: Needs Assistance Bed Mobility: Supine to Sit     Supine to sit: Supervision        Transfers Overall transfer level: Needs assistance Equipment used: 1 person hand held assist Transfers: Sit to/from Stand;Stand Pivot Transfers Sit to Stand: Min assist Stand pivot transfers: Min assist                 ADL Overall ADL's : Needs assistance/impaired Eating/Feeding: Set up;Sitting   Grooming: Set up;Sitting   Upper Body Bathing: Minimal assitance;Sitting   Lower Body Bathing: Moderate assistance;Sit to/from stand;Cueing for sequencing   Upper Body Dressing : Minimal assistance;Sitting   Lower Body Dressing: Moderate assistance;Sit to/from stand       Toileting- Water quality scientist and Hygiene: Moderate assistance;Cueing for sequencing;Sit to/from stand               Vision  Additional Comments: L eye swollen from fall          Pertinent Vitals/Pain Pain Assessment: 0-10 Pain Score: 4  Pain Location: head and neck Pain Descriptors / Indicators: Sore Pain Intervention(s): Monitored during session;Repositioned     Hand Dominance     Extremity/Trunk Assessment Upper Extremity Assessment Upper Extremity Assessment: Overall WFL for tasks assessed           Communication Communication Communication: No difficulties   Cognition Arousal/Alertness: Awake/alert Behavior During Therapy: WFL for tasks assessed/performed Overall Cognitive Status: Within Functional Limits for tasks assessed                                Home Living Family/patient expects to be discharged to:: Private residence Living Arrangements: Alone   Type of Home: Apartment       Home Layout: One level     Bathroom Shower/Tub: Teacher, early years/pre: Standard     Home Equipment: Clinical cytogeneticist - 2 wheels;Cane - single point          Prior Functioning/Environment Level of Independence: Independent             OT Diagnosis: Generalized weakness;Acute pain   OT Problem List: Decreased strength;Decreased safety awareness;Pain   OT Treatment/Interventions: Self-care/ADL training;Patient/family education    OT Goals(Current goals can be found in the care plan section) Acute Rehab OT Goals Patient Stated Goal: home ASAP- she is worried about dog OT Goal Formulation: With patient Time For Goal Achievement: 08/01/16  Potential to Achieve Goals: Good  OT Frequency: Min 2X/week              End of Session Nurse Communication: Mobility status  Activity Tolerance: Patient tolerated treatment well Patient left: in bed   Time: 1220-1242 OT Time Calculation (min): 22 min Charges:  OT General Charges $OT Visit: 1 Procedure OT Evaluation $OT Eval Moderate Complexity: 1 Procedure G-Codes:    Betsy Pries 07-29-16, 12:53  PM

## 2016-07-19 LAB — COMPREHENSIVE METABOLIC PANEL
ALT: 12 U/L — AB (ref 14–54)
ANION GAP: 6 (ref 5–15)
AST: 19 U/L (ref 15–41)
Albumin: 3.2 g/dL — ABNORMAL LOW (ref 3.5–5.0)
Alkaline Phosphatase: 50 U/L (ref 38–126)
BUN: 12 mg/dL (ref 6–20)
CHLORIDE: 108 mmol/L (ref 101–111)
CO2: 25 mmol/L (ref 22–32)
CREATININE: 0.52 mg/dL (ref 0.44–1.00)
Calcium: 8.7 mg/dL — ABNORMAL LOW (ref 8.9–10.3)
Glucose, Bld: 100 mg/dL — ABNORMAL HIGH (ref 65–99)
POTASSIUM: 3.9 mmol/L (ref 3.5–5.1)
SODIUM: 139 mmol/L (ref 135–145)
Total Bilirubin: 0.6 mg/dL (ref 0.3–1.2)
Total Protein: 5.4 g/dL — ABNORMAL LOW (ref 6.5–8.1)

## 2016-07-19 LAB — CBC
HEMATOCRIT: 32.3 % — AB (ref 36.0–46.0)
HEMOGLOBIN: 10.3 g/dL — AB (ref 12.0–15.0)
MCH: 30.4 pg (ref 26.0–34.0)
MCHC: 31.9 g/dL (ref 30.0–36.0)
MCV: 95.3 fL (ref 78.0–100.0)
PLATELETS: 152 10*3/uL (ref 150–400)
RBC: 3.39 MIL/uL — ABNORMAL LOW (ref 3.87–5.11)
RDW: 15.2 % (ref 11.5–15.5)
WBC: 4.3 10*3/uL (ref 4.0–10.5)

## 2016-07-19 LAB — RAPID URINE DRUG SCREEN, HOSP PERFORMED
Amphetamines: NOT DETECTED
BARBITURATES: NOT DETECTED
Benzodiazepines: POSITIVE — AB
Cocaine: NOT DETECTED
Opiates: NOT DETECTED
Tetrahydrocannabinol: NOT DETECTED

## 2016-07-19 LAB — TSH: TSH: 1.69 u[IU]/mL (ref 0.350–4.500)

## 2016-07-19 LAB — T4, FREE: Free T4: 1.01 ng/dL (ref 0.61–1.12)

## 2016-07-19 NOTE — [place] FL2 (Signed)
[place] MEDICAID FL2 LEVEL OF CARE SCREENING TOOL     IDENTIFICATION  Patient Name: Kendra Hernandez Birthdate: 10/16/1941 Sex: female Admission Date (Current Location): 07/16/2016  [facility] and [place] Number:  Herbalist and Address:  [facility], [place] [address] [place],  [address]      Provider Number: [id number]  Attending Physician Name and Address:  Reyne Dumas, MD  Relative Name and Phone Number:  Gene Lavonna Rua - son. Phone [phone]    Current Level of Care: Hospital Recommended Level of Care: [place] Prior Approval Number:    Date Approved/Denied:   PASRR Number: [id number] (Eff. 01/14/16)  Discharge Plan: SNF    Current Diagnoses: Patient Active Problem List   Diagnosis Date Noted  . Alcohol withdrawal ([place]) 07/17/2016  . Withdrawal symptoms, alcohol ([place]) 07/17/2016  . Hypothyroidism 07/17/2016  . History of CVA (cerebrovascular accident) 07/17/2016  . Head contusion 07/17/2016  . Fall 07/17/2016  . Insomnia 07/17/2016  . Essential hypertension 07/17/2016  . Abnormality of gait 01/24/2016  . Memory loss 01/24/2016  . Acute right MCA stroke ([place]) 01/09/2016  . Essential hypertension, benign 01/09/2016  . Hyperlipidemia LDL goal <100 01/09/2016  . Other specified hypothyroidism 01/09/2016  . Cervicalgia 01/09/2016  . Esophageal reflux 01/09/2016  . Major depression, chronic ([place]) 01/09/2016    Orientation RESPIRATION BLADDER Height & Weight     Self, Time  Normal Incontinent Weight: 114 lb 10.2 oz (52 kg) Height:     BEHAVIORAL SYMPTOMS/MOOD NEUROLOGICAL BOWEL NUTRITION STATUS      Continent Diet (Heart healthy)  AMBULATORY STATUS COMMUNICATION OF NEEDS Skin    (Patientunable to ambulate during PT eval on 7/20) Verbally Other (Comment) (Left lateral face laceration)                       Personal Care Assistance Level of Assistance  Bathing, Feeding, Dressing Bathing Assistance:  Maximum assistance Feeding assistance: Independent (help with set-up needed) Dressing Assistance: Maximum assistance     Functional Limitations Info  Sight, Hearing, Speech Sight Info: Impaired (L eye swollen from fall) Hearing Info: Adequate Speech Info: Adequate    SPECIAL CARE FACTORS FREQUENCY  PT (By licensed PT), OT (By licensed OT), Speech therapy     PT Frequency: Evaluated 7/20 and a minimum of 3X per week therapy recommended OT Frequency: Evaluated 7/20 and a minimum of 2X per week therapy recommended     Speech Therapy Frequency: Swallow eval 7/19      Contractures Contractures Info: Not present    Additional Factors Info  Code Status, Allergies Code Status Info: Full Allergies Info: Ceftin, Penicillins, Simvastatin           Current Medications (07/19/2016):  This is the current hospital active medication list Current Facility-Administered Medications  Medication Dose Route Frequency Provider Last Rate Last Dose  . 0.9 % NaCl with KCl 20 mEq/ L  infusion   Intravenous Continuous Reyne Dumas, MD 100 mL/hr at 07/19/16 1158    . acetaminophen (TYLENOL) tablet 1,000 mg  1,000 mg Oral Q6H PRN Waldemar Dickens, MD   1,000 mg at 07/18/16 0956  . aspirin EC tablet 81 mg  81 mg Oral Daily Waldemar Dickens, MD   81 mg at 07/19/16 1019  . atorvastatin (LIPITOR) tablet 10 mg  10 mg Oral q1800 Waldemar Dickens, MD   10 mg at 07/18/16 1729  . baclofen (LIORESAL) tablet 10 mg  10  mg Oral TID Waldemar Dickens, MD   10 mg at 07/19/16 1019  . folic acid (FOLVITE) tablet 1 mg  1 mg Oral Daily Veryl Speak, MD   1 mg at 07/19/16 1019  . heparin injection 5,000 Units  5,000 Units Subcutaneous Q8H Reyne Dumas, MD   5,000 Units at 07/19/16 0532  . hydrALAZINE (APRESOLINE) tablet 50 mg  50 mg Oral BID Waldemar Dickens, MD   50 mg at 07/19/16 1019  . ketorolac (TORADOL) 30 MG/ML injection 30 mg  30 mg Intravenous Q8H PRN Waldemar Dickens, MD   30 mg at 07/18/16 1309  . labetalol  (NORMODYNE) tablet 200 mg  200 mg Oral BID Waldemar Dickens, MD   200 mg at 07/19/16 1019  . levothyroxine (SYNTHROID, LEVOTHROID) tablet 50 mcg  50 mcg Oral QAC breakfast Waldemar Dickens, MD   50 mcg at 07/19/16 0800  . LORazepam (ATIVAN) injection 0-4 mg  0-4 mg Intravenous Q12H Veryl Speak, MD   1 mg at 07/19/16 0800  . LORazepam (ATIVAN) tablet 1 mg  1 mg Oral Q6H PRN Veryl Speak, MD   1 mg at 07/17/16 1516   Or  . LORazepam (ATIVAN) injection 1 mg  1 mg Intravenous Q6H PRN Veryl Speak, MD   1 mg at 07/18/16 1808  . morphine 2 MG/ML injection 2 mg  2 mg Intravenous Q2H PRN Veryl Speak, MD   2 mg at 07/17/16 0559  . multivitamin with minerals tablet 1 tablet  1 tablet Oral Daily Veryl Speak, MD   1 tablet at 07/19/16 1019  . nicotine (NICODERM CQ - dosed in mg/24 hours) patch 14 mg  14 mg Transdermal Daily Reyne Dumas, MD   14 mg at 07/19/16 1020  . ondansetron (ZOFRAN) tablet 4 mg  4 mg Oral Q6H PRN Waldemar Dickens, MD       Or  . ondansetron [facility]) injection 4 mg  4 mg Intravenous Q6H PRN Waldemar Dickens, MD      . oxyCODONE (Oxy IR/ROXICODONE) immediate release tablet 5 mg  5 mg Oral Q6H PRN Waldemar Dickens, MD   5 mg at 07/18/16 1625  . pantoprazole (PROTONIX) EC tablet 80 mg  80 mg Oral Daily Waldemar Dickens, MD   80 mg at 07/19/16 1019  . sodium chloride flush (NS) 0.9 % injection 3 mL  3 mL Intravenous Q12H Waldemar Dickens, MD   3 mL at 07/18/16 0957  . thiamine (VITAMIN B-1) tablet 100 mg  100 mg Oral Daily Veryl Speak, MD   100 mg at 07/19/16 1019  . traZODone (DESYREL) tablet 200 mg  200 mg Oral QHS Waldemar Dickens, MD   200 mg at 07/18/16 2158  . venlafaxine (EFFEXOR) tablet 100 mg  100 mg Oral BID Waldemar Dickens, MD   100 mg at 07/19/16 1019     Discharge Medications: Please see discharge summary for a list of discharge medications.  Relevant Imaging Results:  Relevant Lab Results:   Additional Information [id number]  Sable Feil,  LCSW

## 2016-07-19 NOTE — Progress Notes (Addendum)
[facility] PROGRESS NOTE  Kendra Hernandez [id number] DOB: Oct 28, 1941 DOA: 07/16/2016   PCP: [name], MD     Assessment/Plan: Active Problems:   Essential hypertension, benign   Esophageal reflux   Major depression, chronic (HCC)   Alcohol withdrawal ([place])   Withdrawal symptoms, alcohol ([place])   Hypothyroidism   History of CVA (cerebrovascular accident)   Head contusion   Fall   Insomnia   Essential hypertension   75 y.o. female with medical history significant of CVA, repeated falls, HTN, HLD, insomnia, hypothyroidism, GERD, COPD, EtOH abuse., patient was found today by a neighbor to have a small laceration and swollen eye. The patient has no recollection of falling an stays homes alone in [place] and plan ETOH withdrawal:  Patient with EtOH abuse history. Endorses only drinking a half a can of beer per day though this story is refuted by her son. Alcohol found that patient's home by EMS when she was picked up on the 18th. EtOH level was 172 on admission, now 164,  May start showing withdrawal sx in the next 24 hrs  Patient in the ED since the 18th but developed tremor and agitation.   relieved with Ativan. Cont  telemetry , currently does not seem to be in alcohol withdrawal, continue CIWA protocol UDS pending   Falls: Secondary to alcohol intoxication. Seen in ED on 717 and 718 for falls. 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 Given multiple old infarcts ordered MRI of the brain but patient adamantly refusing- PT/OT recommended SNF, social worker consultation placed   Head contusion:Extensive soft tissue hematoma over the left frontal bone and preseptal left orbit. Fortunately no bony fracture including orbit and no intracranial hemorrhaging. All injury simply too soft tissue structures on the left side of the head. Suspect patient likely also has a concussion given her foggy memory though this is clouded due to the EtOH  use. Evaluated by Trauma and ENT who gave recommendations but opted not to follow as patient can be managed without their assistance.  - Pain medications as needed Patient will be evaluated by psychiatry for cognitive health and capacity today   Hypokalemia-replete and check magnesium levels  HTN: - continue hydralazine, labetalol  Hypothyroid: - conitnue synthroid, check TSH, free T4  GERD: - continue ppi  Insomnia: - continue trazodone  Depression: - continue effexor  HLD: - continue statin  MSK pain:  - continue baclofen    DVT prophylaxsis heparin  Code Status:  Full code     Family Communication: Discussed in detail with the patient, all imaging results, lab results explained to the patient   Disposition Plan:  SNF possibly tomorrow, psychiatric consult pending     Consultants:  Psychiatry  Procedures:  None   Antibiotics: Anti-infectives    None         HPI/Subjective: Patient adamantly refused MRI would like to be discharged and be put on a bus   Objective: Filed Vitals:   07/18/16 1257 07/18/16 1655 07/18/16 2042 07/19/16 0614  BP: 139/53 148/67 148/57 148/64  Pulse: 61 74 80 73  Temp:  97.4 F (36.3 C) 98.3 F (36.8 C) 98 F (36.7 C)  TempSrc:  Oral Oral Oral  Resp:  18 18 18   Weight:      SpO2:  96% 96% 97%    Intake/Output Summary (Last 24 hours) at 07/19/16 0847 Last data filed at 07/18/16 2300  Gross per 24 hour  Intake 1796.67 ml  Output    400 ml  Net 1396.67 ml    Exam:  Examination:   General: Resting in bed, sleeping, wakes briefly to answer questions.  Eyes: R eye visualized onlyt due to pressure dressing over the L side of the head and face. Pupil reactive, EOMI, normal lids,   ENT: grossly normal hearing, tongue, mmm  Neck: no LAD, masses or thyromegaly  Cardiovascular: RRR, III/VI systolic murmur. No LE edema.   Respiratory: CTA bilaterally, no w/r/r. Normal respiratory  effort.  Abdomen: soft, ntnd, NABS  Skin: Significant left facial abrasions and edema and ecchymoses with pressure dressing in place.   Musculoskeletal: grossly normal tone BUE/BLE, good ROM, no bony abnormality  Psychiatric: Difficult to fully ascertain due to patient being heavily medicated. Somnolent. She pushes appropriately. Neurologic: Again difficult to fully assess due to patient being heavily medicated. Results from these in coordinated fashion. Sensation intact.   Data Reviewed: I have personally reviewed following labs and imaging studies  Micro Results No results found for this or any previous visit (from the past 240 hour(s)).  Radiology Reports Dg Shoulder Right  07/16/2016  CLINICAL DATA:  Pain following fall EXAM: RIGHT SHOULDER - 2+ VIEW COMPARISON:  None. FINDINGS: Frontal and Y scapular views were obtained. There is no acute fracture or dislocation. There is osteoarthritic change in the glenohumeral joint and to a lesser extent in the acromioclavicular joint. No erosive change or intra-articular calcification. Visualized right is clear. IMPRESSION: Areas of osteoarthritic change.  No fracture or dislocation. Electronically Signed   By: Lowella Grip III M.D.   On: 07/16/2016 17:56   Ct Head Wo Contrast  07/16/2016  CLINICAL DATA:  Pain following fall after episode of dizziness. EXAM: CT HEAD WITHOUT CONTRAST CT MAXILLOFACIAL WITHOUT CONTRAST CT CERVICAL SPINE WITHOUT CONTRAST TECHNIQUE: Multidetector CT imaging of the head, cervical spine, and maxillofacial structures were performed using the standard protocol without intravenous contrast. Multiplanar CT image reconstructions of the cervical spine and maxillofacial structures were also generated. COMPARISON:  Head CT in knee July 15, 2016 FINDINGS: CT HEAD FINDINGS Mild diffuse atrophy is stable. There is no intracranial mass, hemorrhage, extra-axial fluid collection, or midline shift. There is evidence of an old  infarct involving much of the right temporal lobe as well as portions of posterior right lentiform nucleus and portions of the right internal capsule. Prior infarct also involves a portion of the lateral aspect of the anterior right occipital lobe. No acute infarct is evident. There is calcification in the periphery of the M2 segment of the right middle cerebral artery, likely a chronic thrombus. There are foci of calcification in each distal vertebral artery as well as in the carotid siphon regions bilaterally. No hyperdense vessel is currently evident. The bony calvarium appears intact. There is a large hematoma overlying anterior frontal bone and superior and lateral to the orbit on the left. This hematoma measures 9.6 x 5.6 x 4.0 cm. There is extensive preseptal soft tissue edema over the left orbit as well. Mastoid air cells are clear except for a few opacified mastoid air cells on the left inferiorly. CT MAXILLOFACIAL FINDINGS There is evidence of prior surgical fixation in the mandible on both right and left sides. There is extensive soft tissue edema over the preseptal left orbit as well as over the left frontal bone anteriorly as noted in the head CT report above. There is no acute fracture or dislocation on the current examination. There is mucosal thickening  in several ethmoid air cells bilaterally. Other paranasal sinuses are clear. There is no air-fluid level. No bony destruction or expansion. Ostiomeatal unit complexes are patent bilaterally. There is mild leftward deviation of the nasal septum. There is no nares obstruction on either side. There is no turbinate edema evident. Although not fractured, there is anterior subluxation of the right mandibular condyle with questionable dislocation of the right mandibular condyle. The left mandibular condyle is positioned within the temporal eminence. No other dislocation or subluxation evident. No adenopathy is appreciable. Visualized salivary glands appear  symmetric bilaterally. There are multiple foci of carotid artery calcification bilaterally. CT CERVICAL SPINE FINDINGS There is no demonstrable fracture or spondylolisthesis. There is cervical levoscoliosis. Prevertebral soft tissues and predental space regions are normal. There is moderate disc space narrowing C5-6 and C6-7. There is milder disc space narrowing at C4-5. There is multilevel facet hypertrophy. Exit foraminal narrowing due to bony hypertrophy is noted at multiple levels. No high-grade stenosis evident. There are multiple foci of carotid artery calcification bilaterally. IMPRESSION: There are multiple foci of carotid artery calcification. CT head: Extensive soft tissue hematoma over the left frontal bone and preseptal left orbit. No calvarial fracture. Prior infarcts involving much of the right temporal lobe as well as portions of the right lentiform nucleus and right internal capsule. Infarct also extends into the anterior right occipital lobe. These infarcts are stable. No acute infarct is evident. No intracranial mass, hemorrhage, or extra-axial fluid collection. Calcification in the M2 segment of the right middle cerebral artery likely represents prior thrombus. Several inferior mastoid air cells on the left are opacified. Most the mastoids bilaterally are clear. CT maxillofacial: Old trauma involving the mandible with surgical fixation. Subluxation with possible dislocation of the right mandibular condyles with respect the right temporal eminence. Extensive soft tissue swelling over the preseptal left orbit left frontal bone anteriorly without fracture in this region. No intraorbital lesions are evident. There is mild mucosal thickening in several ethmoid air cells. Other paranasal sinuses are clear. No air-fluid level. No bony destruction or expansion. Ostiomeatal unit complexes patent bilaterally. Mild leftward deviation nasal septum. CT cervical spine: No fracture or spondylolisthesis.  Multilevel arthropathy. Foci of carotid artery calcification noted. Electronically Signed   By: Lowella Grip III M.D.   On: 07/16/2016 19:09   Ct Head Wo Contrast  07/15/2016  CLINICAL DATA:  Dizziness leading to fall. No loss of consciousness. Abrasion to posterior head. EXAM: CT HEAD WITHOUT CONTRAST TECHNIQUE: Contiguous axial images were obtained from the base of the skull through the vertex without intravenous contrast. COMPARISON:  None. FINDINGS: Brain: No intracranial hemorrhage, mass effect, or midline shift. Remote lacunar infarcts in the right basal ganglia and encephalomalacia in the right temporal occipital lobe likely a sequela of prior MCA distribution infarct. No hydrocephalus. The basilar cisterns are patent. No evidence of acute ischemia. No intracranial fluid collection. Vascular: No hyperdense vessel. Calcification but the distal right M1 branch may be sequela of prior thrombus. There is atherosclerosis of skullbase vasculature. Skull: Small right posterior scalp edema/hematoma. Calvarium is intact. Sinuses/Orbits: Included paranasal sinuses and mastoid air cells are well aerated. Other: None. IMPRESSION: 1.  No acute intracranial abnormality. 2. Remote lacunar infarcts in the right basal ganglia and sequela of remote MCA distribution infarct. Electronically Signed   By: Jeb Levering M.D.   On: 07/15/2016 20:18   Ct Cervical Spine Wo Contrast  07/16/2016  CLINICAL DATA:  Pain following fall after episode of dizziness. EXAM: CT HEAD  WITHOUT CONTRAST CT MAXILLOFACIAL WITHOUT CONTRAST CT CERVICAL SPINE WITHOUT CONTRAST TECHNIQUE: Multidetector CT imaging of the head, cervical spine, and maxillofacial structures were performed using the standard protocol without intravenous contrast. Multiplanar CT image reconstructions of the cervical spine and maxillofacial structures were also generated. COMPARISON:  Head CT in knee July 15, 2016 FINDINGS: CT HEAD FINDINGS Mild diffuse atrophy is  stable. There is no intracranial mass, hemorrhage, extra-axial fluid collection, or midline shift. There is evidence of an old infarct involving much of the right temporal lobe as well as portions of posterior right lentiform nucleus and portions of the right internal capsule. Prior infarct also involves a portion of the lateral aspect of the anterior right occipital lobe. No acute infarct is evident. There is calcification in the periphery of the M2 segment of the right middle cerebral artery, likely a chronic thrombus. There are foci of calcification in each distal vertebral artery as well as in the carotid siphon regions bilaterally. No hyperdense vessel is currently evident. The bony calvarium appears intact. There is a large hematoma overlying anterior frontal bone and superior and lateral to the orbit on the left. This hematoma measures 9.6 x 5.6 x 4.0 cm. There is extensive preseptal soft tissue edema over the left orbit as well. Mastoid air cells are clear except for a few opacified mastoid air cells on the left inferiorly. CT MAXILLOFACIAL FINDINGS There is evidence of prior surgical fixation in the mandible on both right and left sides. There is extensive soft tissue edema over the preseptal left orbit as well as over the left frontal bone anteriorly as noted in the head CT report above. There is no acute fracture or dislocation on the current examination. There is mucosal thickening in several ethmoid air cells bilaterally. Other paranasal sinuses are clear. There is no air-fluid level. No bony destruction or expansion. Ostiomeatal unit complexes are patent bilaterally. There is mild leftward deviation of the nasal septum. There is no nares obstruction on either side. There is no turbinate edema evident. Although not fractured, there is anterior subluxation of the right mandibular condyle with questionable dislocation of the right mandibular condyle. The left mandibular condyle is positioned within the  temporal eminence. No other dislocation or subluxation evident. No adenopathy is appreciable. Visualized salivary glands appear symmetric bilaterally. There are multiple foci of carotid artery calcification bilaterally. CT CERVICAL SPINE FINDINGS There is no demonstrable fracture or spondylolisthesis. There is cervical levoscoliosis. Prevertebral soft tissues and predental space regions are normal. There is moderate disc space narrowing C5-6 and C6-7. There is milder disc space narrowing at C4-5. There is multilevel facet hypertrophy. Exit foraminal narrowing due to bony hypertrophy is noted at multiple levels. No high-grade stenosis evident. There are multiple foci of carotid artery calcification bilaterally. IMPRESSION: There are multiple foci of carotid artery calcification. CT head: Extensive soft tissue hematoma over the left frontal bone and preseptal left orbit. No calvarial fracture. Prior infarcts involving much of the right temporal lobe as well as portions of the right lentiform nucleus and right internal capsule. Infarct also extends into the anterior right occipital lobe. These infarcts are stable. No acute infarct is evident. No intracranial mass, hemorrhage, or extra-axial fluid collection. Calcification in the M2 segment of the right middle cerebral artery likely represents prior thrombus. Several inferior mastoid air cells on the left are opacified. Most the mastoids bilaterally are clear. CT maxillofacial: Old trauma involving the mandible with surgical fixation. Subluxation with possible dislocation of the right mandibular condyles  with respect the right temporal eminence. Extensive soft tissue swelling over the preseptal left orbit left frontal bone anteriorly without fracture in this region. No intraorbital lesions are evident. There is mild mucosal thickening in several ethmoid air cells. Other paranasal sinuses are clear. No air-fluid level. No bony destruction or expansion. Ostiomeatal unit  complexes patent bilaterally. Mild leftward deviation nasal septum. CT cervical spine: No fracture or spondylolisthesis. Multilevel arthropathy. Foci of carotid artery calcification noted. Electronically Signed   By: Lowella Grip III M.D.   On: 07/16/2016 19:09   Ct Maxillofacial Wo Contrast  07/16/2016  CLINICAL DATA:  Pain following fall after episode of dizziness. EXAM: CT HEAD WITHOUT CONTRAST CT MAXILLOFACIAL WITHOUT CONTRAST CT CERVICAL SPINE WITHOUT CONTRAST TECHNIQUE: Multidetector CT imaging of the head, cervical spine, and maxillofacial structures were performed using the standard protocol without intravenous contrast. Multiplanar CT image reconstructions of the cervical spine and maxillofacial structures were also generated. COMPARISON:  Head CT in knee July 15, 2016 FINDINGS: CT HEAD FINDINGS Mild diffuse atrophy is stable. There is no intracranial mass, hemorrhage, extra-axial fluid collection, or midline shift. There is evidence of an old infarct involving much of the right temporal lobe as well as portions of posterior right lentiform nucleus and portions of the right internal capsule. Prior infarct also involves a portion of the lateral aspect of the anterior right occipital lobe. No acute infarct is evident. There is calcification in the periphery of the M2 segment of the right middle cerebral artery, likely a chronic thrombus. There are foci of calcification in each distal vertebral artery as well as in the carotid siphon regions bilaterally. No hyperdense vessel is currently evident. The bony calvarium appears intact. There is a large hematoma overlying anterior frontal bone and superior and lateral to the orbit on the left. This hematoma measures 9.6 x 5.6 x 4.0 cm. There is extensive preseptal soft tissue edema over the left orbit as well. Mastoid air cells are clear except for a few opacified mastoid air cells on the left inferiorly. CT MAXILLOFACIAL FINDINGS There is evidence of prior  surgical fixation in the mandible on both right and left sides. There is extensive soft tissue edema over the preseptal left orbit as well as over the left frontal bone anteriorly as noted in the head CT report above. There is no acute fracture or dislocation on the current examination. There is mucosal thickening in several ethmoid air cells bilaterally. Other paranasal sinuses are clear. There is no air-fluid level. No bony destruction or expansion. Ostiomeatal unit complexes are patent bilaterally. There is mild leftward deviation of the nasal septum. There is no nares obstruction on either side. There is no turbinate edema evident. Although not fractured, there is anterior subluxation of the right mandibular condyle with questionable dislocation of the right mandibular condyle. The left mandibular condyle is positioned within the temporal eminence. No other dislocation or subluxation evident. No adenopathy is appreciable. Visualized salivary glands appear symmetric bilaterally. There are multiple foci of carotid artery calcification bilaterally. CT CERVICAL SPINE FINDINGS There is no demonstrable fracture or spondylolisthesis. There is cervical levoscoliosis. Prevertebral soft tissues and predental space regions are normal. There is moderate disc space narrowing C5-6 and C6-7. There is milder disc space narrowing at C4-5. There is multilevel facet hypertrophy. Exit foraminal narrowing due to bony hypertrophy is noted at multiple levels. No high-grade stenosis evident. There are multiple foci of carotid artery calcification bilaterally. IMPRESSION: There are multiple foci of carotid artery calcification. CT head:  Extensive soft tissue hematoma over the left frontal bone and preseptal left orbit. No calvarial fracture. Prior infarcts involving much of the right temporal lobe as well as portions of the right lentiform nucleus and right internal capsule. Infarct also extends into the anterior right occipital lobe.  These infarcts are stable. No acute infarct is evident. No intracranial mass, hemorrhage, or extra-axial fluid collection. Calcification in the M2 segment of the right middle cerebral artery likely represents prior thrombus. Several inferior mastoid air cells on the left are opacified. Most the mastoids bilaterally are clear. CT maxillofacial: Old trauma involving the mandible with surgical fixation. Subluxation with possible dislocation of the right mandibular condyles with respect the right temporal eminence. Extensive soft tissue swelling over the preseptal left orbit left frontal bone anteriorly without fracture in this region. No intraorbital lesions are evident. There is mild mucosal thickening in several ethmoid air cells. Other paranasal sinuses are clear. No air-fluid level. No bony destruction or expansion. Ostiomeatal unit complexes patent bilaterally. Mild leftward deviation nasal septum. CT cervical spine: No fracture or spondylolisthesis. Multilevel arthropathy. Foci of carotid artery calcification noted. Electronically Signed   By: Lowella Grip III M.D.   On: 07/16/2016 19:09     CBC  Recent Labs Lab 07/15/16 1751 07/16/16 1850 07/18/16 0652 07/19/16 0413  WBC 6.3 9.5 5.2 4.3  HGB 13.0 13.4 10.7* 10.3*  HCT 38.9 40.9 32.9* 32.3*  PLT 184 186 148* 152  MCV 93.5 94.7 94.5 95.3  MCH 31.3 31.0 30.7 30.4  MCHC 33.4 32.8 32.5 31.9  RDW 15.8* 15.8* 15.5 15.2  LYMPHSABS 1.6 1.5  --   --   MONOABS 0.6 0.7  --   --   EOSABS 0.1 0.1  --   --   BASOSABS 0.0 0.2*  --   --     Chemistries   Recent Labs Lab 07/15/16 1751 07/16/16 1850 07/18/16 0652 07/19/16 0413  NA 141 143 137 139  K 3.5 3.6 3.3* 3.9  CL 109 108 102 108  CO2 23 23 28 25   GLUCOSE 92 104* 107* 100*  BUN 13 9 15 12   CREATININE 0.64 0.49 0.55 0.52  CALCIUM 9.2 8.7* 9.2 8.7*  MG  --   --  1.8  --   AST 18 21  --  19  ALT 13* 14  --  12*  ALKPHOS 53 66  --  50  BILITOT 0.4 0.4  --  0.6    ------------------------------------------------------------------------------------------------------------------ estimated creatinine clearance is 51.4 mL/min (by C-G formula based on Cr of 0.52). ------------------------------------------------------------------------------------------------------------------ No results for input(s): HGBA1C in the last 72 hours. ------------------------------------------------------------------------------------------------------------------ No results for input(s): CHOL, HDL, LDLCALC, TRIG, CHOLHDL, LDLDIRECT in the last 72 hours. ------------------------------------------------------------------------------------------------------------------ No results for input(s): TSH, T4TOTAL, T3FREE, THYROIDAB in the last 72 hours.  Invalid input(s): FREET3 ------------------------------------------------------------------------------------------------------------------ No results for input(s): VITAMINB12, FOLATE, FERRITIN, TIBC, IRON, RETICCTPCT in the last 72 hours.  Coagulation profile  Recent Labs Lab 07/16/16 1850  INR 1.16    No results for input(s): DDIMER in the last 72 hours.  Cardiac Enzymes No results for input(s): CKMB, TROPONINI, MYOGLOBIN in the last 168 hours.  Invalid input(s): CK ------------------------------------------------------------------------------------------------------------------ Invalid input(s): POCBNP   CBG: No results for input(s): GLUCAP in the last 168 hours.     Studies: No results found.    Lab Results  Component Value Date   HGBA1C 5.1 01/02/2016   Lab Results  Component Value Date   LDLCALC 201 01/02/2016   CREATININE 0.52  07/19/2016       Scheduled Meds: . aspirin EC  81 mg Oral Daily  . atorvastatin  10 mg Oral q1800  . baclofen  10 mg Oral TID  . folic acid  1 mg Oral Daily  . heparin subcutaneous  5,000 Units Subcutaneous Q8H  . hydrALAZINE  50 mg Oral BID  . labetalol  200 mg Oral  BID  . levothyroxine  50 mcg Oral QAC breakfast  . LORazepam  0-4 mg Intravenous Q12H  . multivitamin with minerals  1 tablet Oral Daily  . nicotine  14 mg Transdermal Daily  . pantoprazole  80 mg Oral Daily  . sodium chloride flush  3 mL Intravenous Q12H  . thiamine  100 mg Oral Daily  . traZODone  200 mg Oral QHS  . venlafaxine  100 mg Oral BID   Continuous Infusions: . 0.9 % NaCl with KCl 20 mEq / L 100 mL/hr at 07/18/16 1900     LOS: 2 days    Time spent: >30 MINS    [facility]  [facility] Pager [phone]. If 7PM-7AM, please contact night-coverage at [email or web address], password [email or web address] 07/19/2016, 8:47 AM  LOS: 2 days

## 2016-07-19 NOTE — Care Management Important Message (Signed)
Important Message  Patient Details  Name: Kendra Hernandez MRN: [id number] Date of Birth: 19-Feb-1941   Medicare Important Message Given:  Yes    Loann Quill 07/19/2016, 8:31 AM

## 2016-07-19 NOTE — Clinical Social Work Note (Signed)
Call made to patient's son Gene Cayton [phone] to talk with him about discharge planning for patient. Message left. CSW will continue attempts to reach brother.   Shyne Lehrke Givens, MSW, LCSW Licensed Clinical Social Worker [place] [phone]

## 2016-07-20 DIAGNOSIS — F1020 Alcohol dependence, uncomplicated: Secondary | ICD-10-CM

## 2016-07-20 DIAGNOSIS — F10230 Alcohol dependence with withdrawal, uncomplicated: Secondary | ICD-10-CM

## 2016-07-20 DIAGNOSIS — F329 Major depressive disorder, single episode, unspecified: Secondary | ICD-10-CM

## 2016-07-20 LAB — COMPREHENSIVE METABOLIC PANEL
ALBUMIN: 3.2 g/dL — AB (ref 3.5–5.0)
ALT: 11 U/L — ABNORMAL LOW (ref 14–54)
ANION GAP: 8 (ref 5–15)
AST: 16 U/L (ref 15–41)
Alkaline Phosphatase: 50 U/L (ref 38–126)
BILIRUBIN TOTAL: 0.8 mg/dL (ref 0.3–1.2)
BUN: 5 mg/dL — ABNORMAL LOW (ref 6–20)
CO2: 25 mmol/L (ref 22–32)
Calcium: 9.1 mg/dL (ref 8.9–10.3)
Chloride: 106 mmol/L (ref 101–111)
Creatinine, Ser: 0.53 mg/dL (ref 0.44–1.00)
GFR calc Af Amer: >60 mL/min (ref >60)
GFR calc non Af Amer: >60 mL/min (ref >60)
GLUCOSE: 107 mg/dL — AB (ref 65–99)
POTASSIUM: 3.7 mmol/L (ref 3.5–5.1)
SODIUM: 139 mmol/L (ref 135–145)
TOTAL PROTEIN: 5.6 g/dL — AB (ref 6.5–8.1)

## 2016-07-20 MED ORDER — TRAZODONE HCL 100 MG PO TABS
150.0000 mg | ORAL_TABLET | Freq: Every day | ORAL | Status: DC
  Administered 2016-07-20: 150 mg via ORAL
  Filled 2016-07-20: qty 1

## 2016-07-20 MED ORDER — DIPHENHYDRAMINE HCL 50 MG/ML IJ SOLN
12.5000 mg | Freq: Four times a day (QID) | INTRAMUSCULAR | Status: DC | PRN
  Administered 2016-07-20: 12.5 mg via INTRAVENOUS
  Filled 2016-07-20: qty 1

## 2016-07-20 MED ORDER — TRAZODONE HCL 100 MG PO TABS: 200.0000 mg | ORAL_TABLET | Freq: Every day | ORAL | Status: DC

## 2016-07-20 MED ORDER — VENLAFAXINE HCL ER 150 MG PO CP24
150.0000 mg | ORAL_CAPSULE | Freq: Every day | ORAL | Status: DC
  Administered 2016-07-21: 150 mg via ORAL
  Filled 2016-07-20: qty 1

## 2016-07-20 MED ORDER — LORAZEPAM 2 MG/ML IJ SOLN: 1.0000 mg | INTRAMUSCULAR | Status: DC | PRN

## 2016-07-20 NOTE — Progress Notes (Addendum)
[facility] PROGRESS NOTE  Kendra Hernandez [id number] DOB: 1941/05/03 DOA: 07/16/2016   PCP: [name], MD     Assessment/Plan: Active Problems:   Essential hypertension, benign   Esophageal reflux   Major depression, chronic (HCC)   Alcohol withdrawal ([place])   Withdrawal symptoms, alcohol ([place])   Hypothyroidism   History of CVA (cerebrovascular accident)   Head contusion   Fall   Insomnia   Essential hypertension     75 y.o. female with medical history significant of CVA, repeated falls, HTN, HLD, insomnia, hypothyroidism, GERD, COPD, EtOH abuse., patient was found today by a neighbor to have a small laceration and swollen eye. The patient has no recollection of falling an stays homes alone in [place] and plan ETOH intoxication/withdrawal -no witnessed onset of withdrawal yet but patient still confused   Alcohol found that patient's home by EMS when she was picked up on the 18th. EtOH level was 172 on admission, now 164,  May start showing withdrawal sx in the next 24 hrs  Patient in the ED since the 18th but developed tremor and agitation.   relieved with Ativan. Cont  telemetry , currently does not seem to be in alcohol withdrawal, continue CIWA protocol Waiting on psychiatric consultation to determine if she has capacity, this consult was called in on 7/21   Falls: Secondary to alcohol intoxication. Seen in ED on 717 and 718 for falls. 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 Given multiple old infarcts ordered MRI of the brain but patient adamantly refusing- PT/OT recommended SNF, social worker consultation placed   Head contusion:Extensive soft tissue hematoma over the left frontal bone and preseptal left orbit. Fortunately no bony fracture including orbit and no intracranial hemorrhaging. All injury simply too soft tissue structures on the left side of the head. Suspect patient likely also has a concussion given her foggy  memory though this is clouded due to the EtOH use. Evaluated by Trauma and ENT who gave recommendations but opted not to follow as patient can be managed without their assistance.  Patient waiting to be evaluated by psychiatry for cognitive health and capacity     Hypokalemia-replete and check magnesium levels  HTN: - continue hydralazine, labetalol  Hypothyroid: - conitnue synthroid, normal TSH, free T4  GERD: - continue ppi  Insomnia: - continue trazodone  Depression: - continue effexor  HLD: - continue statin  MSK pain:  - continue baclofen    DVT prophylaxsis heparin  Code Status:  Full code     Family Communication: Discussed in detail with the patient, all imaging results, lab results explained to the patient   Disposition Plan:  SNF possibly tomorrow, psychiatric consult pending     Consultants:  Psychiatry  Procedures:  None   Antibiotics: Anti-infectives    None         HPI/Subjective: Confused, but not agitated   Objective: Filed Vitals:   07/19/16 1700 07/19/16 2109 07/20/16 0548 07/20/16 0849  BP: 162/80 180/90 135/75 127/54  Pulse: 85 84 68 68  Temp: 97.9 F (36.6 C) 97.3 F (36.3 C) 98 F (36.7 C) 97.5 F (36.4 C)  TempSrc: Oral Oral Oral Oral  Resp: 17 17 16 18   Weight:  53.8 kg (118 lb 9.7 oz)    SpO2: 97% 96% 96% 95%    Intake/Output Summary (Last 24 hours) at 07/20/16 0956 Last data filed at 07/20/16 0849  Gross per 24 hour  Intake  3940 ml  Output    650 ml  Net   3290 ml    Exam:  Examination:   General: Resting in bed, sleeping, wakes briefly to answer questions.  Eyes: R eye visualized onlyt due to pressure dressing over the L side of the head and face. Pupil reactive, EOMI, normal lids,   ENT: grossly normal hearing, tongue, mmm  Neck: no LAD, masses or thyromegaly  Cardiovascular: RRR, III/VI systolic murmur. No LE edema.   Respiratory: CTA bilaterally, no w/r/r. Normal respiratory  effort.  Abdomen: soft, ntnd, NABS  Skin: Significant left facial abrasions and edema and ecchymoses with pressure dressing in place.   Musculoskeletal: grossly normal tone BUE/BLE, good ROM, no bony abnormality  Psychiatric: Difficult to fully ascertain due to patient being heavily medicated. Somnolent. She pushes appropriately. Neurologic: Again difficult to fully assess due to patient being heavily medicated. Results from these in coordinated fashion. Sensation intact.   Data Reviewed: I have personally reviewed following labs and imaging studies  Micro Results No results found for this or any previous visit (from the past 240 hour(s)).  Radiology Reports Dg Shoulder Right  07/16/2016  CLINICAL DATA:  Pain following fall EXAM: RIGHT SHOULDER - 2+ VIEW COMPARISON:  None. FINDINGS: Frontal and Y scapular views were obtained. There is no acute fracture or dislocation. There is osteoarthritic change in the glenohumeral joint and to a lesser extent in the acromioclavicular joint. No erosive change or intra-articular calcification. Visualized right is clear. IMPRESSION: Areas of osteoarthritic change.  No fracture or dislocation. Electronically Signed   By: Lowella Grip III M.D.   On: 07/16/2016 17:56   Ct Head Wo Contrast  07/16/2016  CLINICAL DATA:  Pain following fall after episode of dizziness. EXAM: CT HEAD WITHOUT CONTRAST CT MAXILLOFACIAL WITHOUT CONTRAST CT CERVICAL SPINE WITHOUT CONTRAST TECHNIQUE: Multidetector CT imaging of the head, cervical spine, and maxillofacial structures were performed using the standard protocol without intravenous contrast. Multiplanar CT image reconstructions of the cervical spine and maxillofacial structures were also generated. COMPARISON:  Head CT in knee July 15, 2016 FINDINGS: CT HEAD FINDINGS Mild diffuse atrophy is stable. There is no intracranial mass, hemorrhage, extra-axial fluid collection, or midline shift. There is evidence of an old  infarct involving much of the right temporal lobe as well as portions of posterior right lentiform nucleus and portions of the right internal capsule. Prior infarct also involves a portion of the lateral aspect of the anterior right occipital lobe. No acute infarct is evident. There is calcification in the periphery of the M2 segment of the right middle cerebral artery, likely a chronic thrombus. There are foci of calcification in each distal vertebral artery as well as in the carotid siphon regions bilaterally. No hyperdense vessel is currently evident. The bony calvarium appears intact. There is a large hematoma overlying anterior frontal bone and superior and lateral to the orbit on the left. This hematoma measures 9.6 x 5.6 x 4.0 cm. There is extensive preseptal soft tissue edema over the left orbit as well. Mastoid air cells are clear except for a few opacified mastoid air cells on the left inferiorly. CT MAXILLOFACIAL FINDINGS There is evidence of prior surgical fixation in the mandible on both right and left sides. There is extensive soft tissue edema over the preseptal left orbit as well as over the left frontal bone anteriorly as noted in the head CT report above. There is no acute fracture or dislocation on the current examination. There is mucosal  thickening in several ethmoid air cells bilaterally. Other paranasal sinuses are clear. There is no air-fluid level. No bony destruction or expansion. Ostiomeatal unit complexes are patent bilaterally. There is mild leftward deviation of the nasal septum. There is no nares obstruction on either side. There is no turbinate edema evident. Although not fractured, there is anterior subluxation of the right mandibular condyle with questionable dislocation of the right mandibular condyle. The left mandibular condyle is positioned within the temporal eminence. No other dislocation or subluxation evident. No adenopathy is appreciable. Visualized salivary glands appear  symmetric bilaterally. There are multiple foci of carotid artery calcification bilaterally. CT CERVICAL SPINE FINDINGS There is no demonstrable fracture or spondylolisthesis. There is cervical levoscoliosis. Prevertebral soft tissues and predental space regions are normal. There is moderate disc space narrowing C5-6 and C6-7. There is milder disc space narrowing at C4-5. There is multilevel facet hypertrophy. Exit foraminal narrowing due to bony hypertrophy is noted at multiple levels. No high-grade stenosis evident. There are multiple foci of carotid artery calcification bilaterally. IMPRESSION: There are multiple foci of carotid artery calcification. CT head: Extensive soft tissue hematoma over the left frontal bone and preseptal left orbit. No calvarial fracture. Prior infarcts involving much of the right temporal lobe as well as portions of the right lentiform nucleus and right internal capsule. Infarct also extends into the anterior right occipital lobe. These infarcts are stable. No acute infarct is evident. No intracranial mass, hemorrhage, or extra-axial fluid collection. Calcification in the M2 segment of the right middle cerebral artery likely represents prior thrombus. Several inferior mastoid air cells on the left are opacified. Most the mastoids bilaterally are clear. CT maxillofacial: Old trauma involving the mandible with surgical fixation. Subluxation with possible dislocation of the right mandibular condyles with respect the right temporal eminence. Extensive soft tissue swelling over the preseptal left orbit left frontal bone anteriorly without fracture in this region. No intraorbital lesions are evident. There is mild mucosal thickening in several ethmoid air cells. Other paranasal sinuses are clear. No air-fluid level. No bony destruction or expansion. Ostiomeatal unit complexes patent bilaterally. Mild leftward deviation nasal septum. CT cervical spine: No fracture or spondylolisthesis.  Multilevel arthropathy. Foci of carotid artery calcification noted. Electronically Signed   By: Lowella Grip III M.D.   On: 07/16/2016 19:09   Ct Head Wo Contrast  07/15/2016  CLINICAL DATA:  Dizziness leading to fall. No loss of consciousness. Abrasion to posterior head. EXAM: CT HEAD WITHOUT CONTRAST TECHNIQUE: Contiguous axial images were obtained from the base of the skull through the vertex without intravenous contrast. COMPARISON:  None. FINDINGS: Brain: No intracranial hemorrhage, mass effect, or midline shift. Remote lacunar infarcts in the right basal ganglia and encephalomalacia in the right temporal occipital lobe likely a sequela of prior MCA distribution infarct. No hydrocephalus. The basilar cisterns are patent. No evidence of acute ischemia. No intracranial fluid collection. Vascular: No hyperdense vessel. Calcification but the distal right M1 branch may be sequela of prior thrombus. There is atherosclerosis of skullbase vasculature. Skull: Small right posterior scalp edema/hematoma. Calvarium is intact. Sinuses/Orbits: Included paranasal sinuses and mastoid air cells are well aerated. Other: None. IMPRESSION: 1.  No acute intracranial abnormality. 2. Remote lacunar infarcts in the right basal ganglia and sequela of remote MCA distribution infarct. Electronically Signed   By: Jeb Levering M.D.   On: 07/15/2016 20:18   Ct Cervical Spine Wo Contrast  07/16/2016  CLINICAL DATA:  Pain following fall after episode of dizziness. EXAM: CT  HEAD WITHOUT CONTRAST CT MAXILLOFACIAL WITHOUT CONTRAST CT CERVICAL SPINE WITHOUT CONTRAST TECHNIQUE: Multidetector CT imaging of the head, cervical spine, and maxillofacial structures were performed using the standard protocol without intravenous contrast. Multiplanar CT image reconstructions of the cervical spine and maxillofacial structures were also generated. COMPARISON:  Head CT in knee July 15, 2016 FINDINGS: CT HEAD FINDINGS Mild diffuse atrophy is  stable. There is no intracranial mass, hemorrhage, extra-axial fluid collection, or midline shift. There is evidence of an old infarct involving much of the right temporal lobe as well as portions of posterior right lentiform nucleus and portions of the right internal capsule. Prior infarct also involves a portion of the lateral aspect of the anterior right occipital lobe. No acute infarct is evident. There is calcification in the periphery of the M2 segment of the right middle cerebral artery, likely a chronic thrombus. There are foci of calcification in each distal vertebral artery as well as in the carotid siphon regions bilaterally. No hyperdense vessel is currently evident. The bony calvarium appears intact. There is a large hematoma overlying anterior frontal bone and superior and lateral to the orbit on the left. This hematoma measures 9.6 x 5.6 x 4.0 cm. There is extensive preseptal soft tissue edema over the left orbit as well. Mastoid air cells are clear except for a few opacified mastoid air cells on the left inferiorly. CT MAXILLOFACIAL FINDINGS There is evidence of prior surgical fixation in the mandible on both right and left sides. There is extensive soft tissue edema over the preseptal left orbit as well as over the left frontal bone anteriorly as noted in the head CT report above. There is no acute fracture or dislocation on the current examination. There is mucosal thickening in several ethmoid air cells bilaterally. Other paranasal sinuses are clear. There is no air-fluid level. No bony destruction or expansion. Ostiomeatal unit complexes are patent bilaterally. There is mild leftward deviation of the nasal septum. There is no nares obstruction on either side. There is no turbinate edema evident. Although not fractured, there is anterior subluxation of the right mandibular condyle with questionable dislocation of the right mandibular condyle. The left mandibular condyle is positioned within the  temporal eminence. No other dislocation or subluxation evident. No adenopathy is appreciable. Visualized salivary glands appear symmetric bilaterally. There are multiple foci of carotid artery calcification bilaterally. CT CERVICAL SPINE FINDINGS There is no demonstrable fracture or spondylolisthesis. There is cervical levoscoliosis. Prevertebral soft tissues and predental space regions are normal. There is moderate disc space narrowing C5-6 and C6-7. There is milder disc space narrowing at C4-5. There is multilevel facet hypertrophy. Exit foraminal narrowing due to bony hypertrophy is noted at multiple levels. No high-grade stenosis evident. There are multiple foci of carotid artery calcification bilaterally. IMPRESSION: There are multiple foci of carotid artery calcification. CT head: Extensive soft tissue hematoma over the left frontal bone and preseptal left orbit. No calvarial fracture. Prior infarcts involving much of the right temporal lobe as well as portions of the right lentiform nucleus and right internal capsule. Infarct also extends into the anterior right occipital lobe. These infarcts are stable. No acute infarct is evident. No intracranial mass, hemorrhage, or extra-axial fluid collection. Calcification in the M2 segment of the right middle cerebral artery likely represents prior thrombus. Several inferior mastoid air cells on the left are opacified. Most the mastoids bilaterally are clear. CT maxillofacial: Old trauma involving the mandible with surgical fixation. Subluxation with possible dislocation of the right mandibular  condyles with respect the right temporal eminence. Extensive soft tissue swelling over the preseptal left orbit left frontal bone anteriorly without fracture in this region. No intraorbital lesions are evident. There is mild mucosal thickening in several ethmoid air cells. Other paranasal sinuses are clear. No air-fluid level. No bony destruction or expansion. Ostiomeatal unit  complexes patent bilaterally. Mild leftward deviation nasal septum. CT cervical spine: No fracture or spondylolisthesis. Multilevel arthropathy. Foci of carotid artery calcification noted. Electronically Signed   By: Lowella Grip III M.D.   On: 07/16/2016 19:09   Ct Maxillofacial Wo Contrast  07/16/2016  CLINICAL DATA:  Pain following fall after episode of dizziness. EXAM: CT HEAD WITHOUT CONTRAST CT MAXILLOFACIAL WITHOUT CONTRAST CT CERVICAL SPINE WITHOUT CONTRAST TECHNIQUE: Multidetector CT imaging of the head, cervical spine, and maxillofacial structures were performed using the standard protocol without intravenous contrast. Multiplanar CT image reconstructions of the cervical spine and maxillofacial structures were also generated. COMPARISON:  Head CT in knee July 15, 2016 FINDINGS: CT HEAD FINDINGS Mild diffuse atrophy is stable. There is no intracranial mass, hemorrhage, extra-axial fluid collection, or midline shift. There is evidence of an old infarct involving much of the right temporal lobe as well as portions of posterior right lentiform nucleus and portions of the right internal capsule. Prior infarct also involves a portion of the lateral aspect of the anterior right occipital lobe. No acute infarct is evident. There is calcification in the periphery of the M2 segment of the right middle cerebral artery, likely a chronic thrombus. There are foci of calcification in each distal vertebral artery as well as in the carotid siphon regions bilaterally. No hyperdense vessel is currently evident. The bony calvarium appears intact. There is a large hematoma overlying anterior frontal bone and superior and lateral to the orbit on the left. This hematoma measures 9.6 x 5.6 x 4.0 cm. There is extensive preseptal soft tissue edema over the left orbit as well. Mastoid air cells are clear except for a few opacified mastoid air cells on the left inferiorly. CT MAXILLOFACIAL FINDINGS There is evidence of prior  surgical fixation in the mandible on both right and left sides. There is extensive soft tissue edema over the preseptal left orbit as well as over the left frontal bone anteriorly as noted in the head CT report above. There is no acute fracture or dislocation on the current examination. There is mucosal thickening in several ethmoid air cells bilaterally. Other paranasal sinuses are clear. There is no air-fluid level. No bony destruction or expansion. Ostiomeatal unit complexes are patent bilaterally. There is mild leftward deviation of the nasal septum. There is no nares obstruction on either side. There is no turbinate edema evident. Although not fractured, there is anterior subluxation of the right mandibular condyle with questionable dislocation of the right mandibular condyle. The left mandibular condyle is positioned within the temporal eminence. No other dislocation or subluxation evident. No adenopathy is appreciable. Visualized salivary glands appear symmetric bilaterally. There are multiple foci of carotid artery calcification bilaterally. CT CERVICAL SPINE FINDINGS There is no demonstrable fracture or spondylolisthesis. There is cervical levoscoliosis. Prevertebral soft tissues and predental space regions are normal. There is moderate disc space narrowing C5-6 and C6-7. There is milder disc space narrowing at C4-5. There is multilevel facet hypertrophy. Exit foraminal narrowing due to bony hypertrophy is noted at multiple levels. No high-grade stenosis evident. There are multiple foci of carotid artery calcification bilaterally. IMPRESSION: There are multiple foci of carotid artery calcification. CT  head: Extensive soft tissue hematoma over the left frontal bone and preseptal left orbit. No calvarial fracture. Prior infarcts involving much of the right temporal lobe as well as portions of the right lentiform nucleus and right internal capsule. Infarct also extends into the anterior right occipital lobe.  These infarcts are stable. No acute infarct is evident. No intracranial mass, hemorrhage, or extra-axial fluid collection. Calcification in the M2 segment of the right middle cerebral artery likely represents prior thrombus. Several inferior mastoid air cells on the left are opacified. Most the mastoids bilaterally are clear. CT maxillofacial: Old trauma involving the mandible with surgical fixation. Subluxation with possible dislocation of the right mandibular condyles with respect the right temporal eminence. Extensive soft tissue swelling over the preseptal left orbit left frontal bone anteriorly without fracture in this region. No intraorbital lesions are evident. There is mild mucosal thickening in several ethmoid air cells. Other paranasal sinuses are clear. No air-fluid level. No bony destruction or expansion. Ostiomeatal unit complexes patent bilaterally. Mild leftward deviation nasal septum. CT cervical spine: No fracture or spondylolisthesis. Multilevel arthropathy. Foci of carotid artery calcification noted. Electronically Signed   By: Lowella Grip III M.D.   On: 07/16/2016 19:09     CBC  Recent Labs Lab 07/15/16 1751 07/16/16 1850 07/18/16 0652 07/19/16 0413  WBC 6.3 9.5 5.2 4.3  HGB 13.0 13.4 10.7* 10.3*  HCT 38.9 40.9 32.9* 32.3*  PLT 184 186 148* 152  MCV 93.5 94.7 94.5 95.3  MCH 31.3 31.0 30.7 30.4  MCHC 33.4 32.8 32.5 31.9  RDW 15.8* 15.8* 15.5 15.2  LYMPHSABS 1.6 1.5  --   --   MONOABS 0.6 0.7  --   --   EOSABS 0.1 0.1  --   --   BASOSABS 0.0 0.2*  --   --     Chemistries   Recent Labs Lab 07/15/16 1751 07/16/16 1850 07/18/16 0652 07/19/16 0413 07/20/16 0538  NA 141 143 137 139 139  K 3.5 3.6 3.3* 3.9 3.7  CL 109 108 102 108 106  CO2 23 23 28 25 25   GLUCOSE 92 104* 107* 100* 107*  BUN 13 9 15 12  5*  CREATININE 0.64 0.49 0.55 0.52 0.53  CALCIUM 9.2 8.7* 9.2 8.7* 9.1  MG  --   --  1.8  --   --   AST 18 21  --  19 16  ALT 13* 14  --  12* 11*  ALKPHOS  53 66  --  50 50  BILITOT 0.4 0.4  --  0.6 0.8   ------------------------------------------------------------------------------------------------------------------ estimated creatinine clearance is 51.8 mL/min (by C-G formula based on Cr of 0.53). ------------------------------------------------------------------------------------------------------------------ No results for input(s): HGBA1C in the last 72 hours. ------------------------------------------------------------------------------------------------------------------ No results for input(s): CHOL, HDL, LDLCALC, TRIG, CHOLHDL, LDLDIRECT in the last 72 hours. ------------------------------------------------------------------------------------------------------------------  Recent Labs  07/19/16 0906  TSH 1.690   ------------------------------------------------------------------------------------------------------------------ No results for input(s): VITAMINB12, FOLATE, FERRITIN, TIBC, IRON, RETICCTPCT in the last 72 hours.  Coagulation profile  Recent Labs Lab 07/16/16 1850  INR 1.16    No results for input(s): DDIMER in the last 72 hours.  Cardiac Enzymes No results for input(s): CKMB, TROPONINI, MYOGLOBIN in the last 168 hours.  Invalid input(s): CK ------------------------------------------------------------------------------------------------------------------ Invalid input(s): POCBNP   CBG: No results for input(s): GLUCAP in the last 168 hours.     Studies: No results found.    Lab Results  Component Value Date   HGBA1C 5.1 01/02/2016   Lab Results  Component Value Date   LDLCALC 201 01/02/2016   CREATININE 0.53 07/20/2016       Scheduled Meds: . aspirin EC  81 mg Oral Daily  . atorvastatin  10 mg Oral q1800  . baclofen  10 mg Oral TID  . folic acid  1 mg Oral Daily  . heparin subcutaneous  5,000 Units Subcutaneous Q8H  . hydrALAZINE  50 mg Oral BID  . labetalol  200 mg Oral BID  .  levothyroxine  50 mcg Oral QAC breakfast  . LORazepam  0-4 mg Intravenous Q12H  . multivitamin with minerals  1 tablet Oral Daily  . nicotine  14 mg Transdermal Daily  . pantoprazole  80 mg Oral Daily  . sodium chloride flush  3 mL Intravenous Q12H  . thiamine  100 mg Oral Daily  . traZODone  200 mg Oral QHS  . venlafaxine  100 mg Oral BID   Continuous Infusions: . 0.9 % NaCl with KCl 20 mEq / L 100 mL/hr at 07/20/16 0951     LOS: 3 days    Time spent: >30 MINS    [facility] Pager [phone]. If 7PM-7AM, please contact night-coverage at [email or web address], password [email or web address] 07/20/2016, 9:56 AM  LOS: 3 days

## 2016-07-20 NOTE — Consult Note (Signed)
[facility] Face-to-Face Psychiatry Consult   Reason for Consult:  Alcohol abuse, capacity determination Referring Physician:  Dr. Allyson Sabal Patient Identification: Kendra Hernandez MRN:  [id number] Principal Diagnosis: Major depression, chronic ([place]) Diagnosis:   Patient Active Problem List   Diagnosis Date Noted  . Major depression, chronic ([place]) [F32.9] 01/09/2016    Priority: Medium  . Alcohol withdrawal ([place]) [F10.239] 07/17/2016  . Withdrawal symptoms, alcohol ([place]) [F10.239] 07/17/2016  . Hypothyroidism [E03.9] 07/17/2016  . History of CVA (cerebrovascular accident) [Z86.73] 07/17/2016  . Head contusion [S00.93XA] 07/17/2016  . Fall [W19.XXXA] 07/17/2016  . Insomnia [G47.00] 07/17/2016  . Essential hypertension [I10] 07/17/2016  . Abnormality of gait [R26.9] 01/24/2016  . Memory loss [R41.3] 01/24/2016  . Acute right MCA stroke ([place]) [I63.511] 01/09/2016  . Essential hypertension, benign [I10] 01/09/2016  . Hyperlipidemia LDL goal <100 [E78.5] 01/09/2016  . Other specified hypothyroidism [E03.8] 01/09/2016  . Cervicalgia [M54.2] 01/09/2016  . Esophageal reflux [K21.9] 01/09/2016    Total Time spent with patient: 1 hour  Subjective:   Kendra Hernandez is a 75 y.o. female patient admitted with alcohol intoxication, withdrawal and fall.  HPI: Thank you for asking me to do a psych consult on Kendra Hernandez, 75 y.o. female with long history of Major depression, Alcohol use disorder, medical history significant of CVA, repeated falls, HTN, HLD, insomnia, hypothyroidism, GERD and COPD. Patient was brought to the hospital after she found  by a neighbor to have a small laceration and swollen right eye. Patient did not recollect how she fell but she was able to remember that she was drinking liquor. Patient reports that she lives alone and has been depressed for a long time. She endorses loneliness, low energy level, anhedonia, hopelessness and drinks to "feel good". She denies suicidal/j\homicidal  thoughts, delusions or psychosis. Patient is requesting for help with her depression and Alcohol. She is alert and oriented x4. Patient is aware of her environment, aware of being depressed as well as abusing Alcohol, consequences of drinking and falling and she  Verbalizes the need to get help. Patient is not a danger to herself or others at this time.  Past Psychiatric History: as above  Risk to Self: Is patient at risk for suicide?: No Risk to Others:   Prior Inpatient Therapy:   Prior Outpatient Therapy:    Past Medical History:  Past Medical History  Diagnosis Date  . Stroke (cerebrum) ([place])   . Repeated falls   . Hypertension   . Hyperlipidemia   . Insomnia   . Hypothyroid   . Reflux   . Anxiety   . Stress   . Skin cancer   . Arthritis   . COPD (chronic obstructive pulmonary disease) [facility])     Past Surgical History  Procedure Laterality Date  . Toe surgery Right     Removal of toe   . Mandible fracture surgery      Placement of partial jaw due to abcess  . Skin cancer excision      Removal of several skin cancers  . Colonoscopy      With polyp removal    Family History:  Family History  Problem Relation Age of Onset  . Emphysema Brother   . Emphysema Mother   . Heart attack Father   . Rheumatologic disease Mother   . Pancreatic cancer Daughter    Family Psychiatric  History:  Social History:  History  Alcohol Use  . 0.0 oz/week  . 0 Standard drinks or equivalent per week  History  Drug Use No    Social History   Social History  . Marital Status: Divorced    Spouse Name: N/A  . Number of Children: 2  . Years of Education: 13   Occupational History  . Retired    Social History Main Topics  . Smoking status: Current Every Day Smoker -- 1.00 packs/day for 30 years    Types: Cigarettes  . Smokeless tobacco: None  . Alcohol Use: 0.0 oz/week    0 Standard drinks or equivalent per week  . Drug Use: No  . Sexual Activity: Not Asked   Other  Topics Concern  . None   Social History Narrative   Right-handed.   Currently in stroke rehab at [facility].     Occasional caffeine use.   Additional Social History:    Allergies:   Allergies  Allergen Reactions  . Ceftin [Cefuroxime Axetil]   . Penicillins Hives    Hives and swelling as a child   . Simvastatin     Patient can not recall side effect     Labs:  Results for orders placed or performed during the hospital encounter of 07/16/16 (from the past 48 hour(s))  CBC     Status: Abnormal   Collection Time: 07/19/16  4:13 AM  Result Value Ref Range   WBC 4.3 4.0 - 10.5 K/uL   RBC 3.39 (L) 3.87 - 5.11 MIL/uL   Hemoglobin 10.3 (L) 12.0 - 15.0 g/dL   HCT 32.3 (L) 36.0 - 46.0 %   MCV 95.3 78.0 - 100.0 fL   MCH 30.4 26.0 - 34.0 pg   MCHC 31.9 30.0 - 36.0 g/dL   RDW 15.2 11.5 - 15.5 %   Platelets 152 150 - 400 K/uL  Comprehensive metabolic panel     Status: Abnormal   Collection Time: 07/19/16  4:13 AM  Result Value Ref Range   Sodium 139 135 - 145 mmol/L   Potassium 3.9 3.5 - 5.1 mmol/L   Chloride 108 101 - 111 mmol/L   CO2 25 22 - 32 mmol/L   Glucose, Bld 100 (H) 65 - 99 mg/dL   BUN 12 6 - 20 mg/dL   Creatinine, Ser 0.52 0.44 - 1.00 mg/dL   Calcium 8.7 (L) 8.9 - 10.3 mg/dL   Total Protein 5.4 (L) 6.5 - 8.1 g/dL   Albumin 3.2 (L) 3.5 - 5.0 g/dL   AST 19 15 - 41 U/L   ALT 12 (L) 14 - 54 U/L   Alkaline Phosphatase 50 38 - 126 U/L   Total Bilirubin 0.6 0.3 - 1.2 mg/dL   GFR calc non Af Amer >60 >60 mL/min   GFR calc Af Amer >60 >60 mL/min    Comment: (NOTE) The eGFR has been calculated using the CKD EPI equation. This calculation has not been validated in all clinical situations. eGFR's persistently <60 mL/min signify possible Chronic Kidney Disease.    Anion gap 6 5 - 15  TSH     Status: None   Collection Time: 07/19/16  9:06 AM  Result Value Ref Range   TSH 1.690 0.350 - 4.500 uIU/mL  T4, free     Status: None   Collection Time: 07/19/16  9:06 AM   Result Value Ref Range   Free T4 1.01 0.61 - 1.12 ng/dL    Comment: (NOTE) Biotin ingestion may interfere with free T4 tests. If the results are inconsistent with the TSH level, previous test results, or the clinical presentation, then  consider biotin interference. If needed, order repeat testing after stopping biotin.   Urine rapid drug screen (hosp performed)     Status: Abnormal   Collection Time: 07/19/16  5:00 PM  Result Value Ref Range   Opiates NONE DETECTED NONE DETECTED   Cocaine NONE DETECTED NONE DETECTED   Benzodiazepines POSITIVE (A) NONE DETECTED   Amphetamines NONE DETECTED NONE DETECTED   Tetrahydrocannabinol NONE DETECTED NONE DETECTED   Barbiturates NONE DETECTED NONE DETECTED    Comment:        DRUG SCREEN FOR MEDICAL PURPOSES ONLY.  IF CONFIRMATION IS NEEDED FOR ANY PURPOSE, NOTIFY LAB WITHIN 5 DAYS.        LOWEST DETECTABLE LIMITS FOR URINE DRUG SCREEN Drug Class       Cutoff (ng/mL) Amphetamine      1000 Barbiturate      200 Benzodiazepine   384 Tricyclics       665 Opiates          300 Cocaine          300 THC              50   Comprehensive metabolic panel     Status: Abnormal   Collection Time: 07/20/16  5:38 AM  Result Value Ref Range   Sodium 139 135 - 145 mmol/L   Potassium 3.7 3.5 - 5.1 mmol/L   Chloride 106 101 - 111 mmol/L   CO2 25 22 - 32 mmol/L   Glucose, Bld 107 (H) 65 - 99 mg/dL   BUN 5 (L) 6 - 20 mg/dL   Creatinine, Ser 0.53 0.44 - 1.00 mg/dL   Calcium 9.1 8.9 - 10.3 mg/dL   Total Protein 5.6 (L) 6.5 - 8.1 g/dL   Albumin 3.2 (L) 3.5 - 5.0 g/dL   AST 16 15 - 41 U/L   ALT 11 (L) 14 - 54 U/L   Alkaline Phosphatase 50 38 - 126 U/L   Total Bilirubin 0.8 0.3 - 1.2 mg/dL   GFR calc non Af Amer >60 >60 mL/min   GFR calc Af Amer >60 >60 mL/min    Comment: (NOTE) The eGFR has been calculated using the CKD EPI equation. This calculation has not been validated in all clinical situations. eGFR's persistently <60 mL/min signify possible  Chronic Kidney Disease.    Anion gap 8 5 - 15    Current Facility-Administered Medications  Medication Dose Route Frequency Provider Last Rate Last Dose  . 0.9 % NaCl with KCl 20 mEq/ L  infusion   Intravenous Continuous Reyne Dumas, MD 100 mL/hr at 07/20/16 0951    . acetaminophen (TYLENOL) tablet 1,000 mg  1,000 mg Oral Q6H PRN Waldemar Dickens, MD   1,000 mg at 07/18/16 0956  . aspirin EC tablet 81 mg  81 mg Oral Daily Waldemar Dickens, MD   81 mg at 07/20/16 9935  . atorvastatin (LIPITOR) tablet 10 mg  10 mg Oral q1800 Waldemar Dickens, MD   10 mg at 07/19/16 1711  . baclofen (LIORESAL) tablet 10 mg  10 mg Oral TID Waldemar Dickens, MD   10 mg at 07/20/16 7017  . diphenhydrAMINE (BENADRYL) injection 12.5 mg  12.5 mg Intravenous Q6H PRN Reyne Dumas, MD   12.5 mg at 07/20/16 1440  . folic acid (FOLVITE) tablet 1 mg  1 mg Oral Daily Veryl Speak, MD   1 mg at 07/20/16 0929  . heparin injection 5,000 Units  5,000 Units Subcutaneous Q8H Nayana Abrol,  MD   5,000 Units at 07/20/16 1354  . hydrALAZINE (APRESOLINE) tablet 50 mg  50 mg Oral BID Waldemar Dickens, MD   50 mg at 07/20/16 0929  . ketorolac (TORADOL) 30 MG/ML injection 30 mg  30 mg Intravenous Q8H PRN Waldemar Dickens, MD   30 mg at 07/18/16 1309  . labetalol (NORMODYNE) tablet 200 mg  200 mg Oral BID Waldemar Dickens, MD   200 mg at 07/20/16 0929  . levothyroxine (SYNTHROID, LEVOTHROID) tablet 50 mcg  50 mcg Oral QAC breakfast Waldemar Dickens, MD   50 mcg at 07/20/16 [phone]  . LORazepam (ATIVAN) injection 0-4 mg  0-4 mg Intravenous Q12H Veryl Speak, MD   2 mg at 07/20/16 0931  . LORazepam (ATIVAN) injection 1 mg  1 mg Intravenous Q4H PRN Reyne Dumas, MD      . morphine 2 MG/ML injection 2 mg  2 mg Intravenous Q2H PRN Veryl Speak, MD   2 mg at 07/20/16 1355  . multivitamin with minerals tablet 1 tablet  1 tablet Oral Daily Veryl Speak, MD   1 tablet at 07/20/16 0929  . nicotine (NICODERM CQ - dosed in mg/24 hours) patch 14 mg  14 mg  Transdermal Daily Reyne Dumas, MD   14 mg at 07/20/16 0928  . ondansetron (ZOFRAN) tablet 4 mg  4 mg Oral Q6H PRN Waldemar Dickens, MD       Or  . ondansetron [facility]) injection 4 mg  4 mg Intravenous Q6H PRN Waldemar Dickens, MD      . oxyCODONE (Oxy IR/ROXICODONE) immediate release tablet 5 mg  5 mg Oral Q6H PRN Waldemar Dickens, MD   5 mg at 07/18/16 1625  . pantoprazole (PROTONIX) EC tablet 80 mg  80 mg Oral Daily Waldemar Dickens, MD   80 mg at 07/20/16 0929  . sodium chloride flush (NS) 0.9 % injection 3 mL  3 mL Intravenous Q12H Waldemar Dickens, MD   3 mL at 07/20/16 0930  . thiamine (VITAMIN B-1) tablet 100 mg  100 mg Oral Daily Veryl Speak, MD   100 mg at 07/20/16 0929  . traZODone (DESYREL) tablet 150 mg  150 mg Oral QHS Corena Pilgrim, MD      . Derrill Memo ON 07/21/2016] venlafaxine XR (EFFEXOR-XR) 24 hr capsule 150 mg  150 mg Oral Q breakfast Corena Pilgrim, MD        Musculoskeletal: Strength & Muscle Tone: within normal limits Gait & Station: not tested Patient leans: N/A  Psychiatric Specialty Exam: Physical Exam  Psychiatric: Her speech is normal. Judgment and thought content normal. Her mood appears anxious. She is slowed and withdrawn. Cognition and memory are normal. She exhibits a depressed mood.    Review of Systems  Eyes: Negative.   Gastrointestinal: Positive for nausea.  Genitourinary: Negative.   Skin: Negative.   Neurological: Positive for tremors and weakness.  Psychiatric/Behavioral: Positive for depression and substance abuse. The patient is nervous/anxious and has insomnia.     Blood pressure 127/54, pulse 68, temperature 97.5 F (36.4 C), temperature source Oral, resp. rate 18, weight 53.8 kg (118 lb 9.7 oz), SpO2 95 %.Body mass index is 21.02 kg/(m^2).  General Appearance: Casual  Eye Contact:  Good  Speech:  Clear and Coherent  Volume:  Decreased  Mood:  Anxious and Depressed  Affect:  Constricted  Thought Process:  Coherent  Orientation:  Full (Time,  Place, and Person)  Thought Content:  Logical  Suicidal Thoughts:  No  Homicidal Thoughts:  No  Memory:  Immediate;   Fair Recent;   Fair Remote;   Good  Judgement:  Fair  Insight:  Fair  Psychomotor Activity:  Tremor  Concentration:  Concentration: Fair and Attention Span: Fair  Recall:  [employer] of Knowledge:  Good  Language:  Good  Akathisia:  No  Handed:  Right  AIMS (if indicated):     Assets:  Communication Skills Desire for Improvement  ADL's:  Intact  Cognition:  WNL  Sleep:   poor     Treatment Plan Summary: Plan/Recommendations: -Changer Effexor to Effexor XR 190m daily for depression. -Decrease Trazodone to 150 mg qhs for alcoholism/insomnia -Continue Ativan detox protocol for alcohol withdrawal -Patient has capacity to make medical/informed decision at this time, she is aware of her issues and verbalizes the need to get help. -Will benefit from home aide for assistance with daily living. -Patient is cleared by psychiatric service, re-consult if necessary in future  Disposition:  -No evidence of imminent risk to self or others at present.   -Patient does not meet criteria for psychiatric inpatient admission. -Supportive therapy provided about ongoing stressors. -Needs referral to  geriatric psychiatric by the Unit social worker upon discharge  ACorena Pilgrim MD 07/20/2016 4:26 PM

## 2016-07-21 DIAGNOSIS — F10231 Alcohol dependence with withdrawal delirium: Secondary | ICD-10-CM

## 2016-07-21 MED ORDER — NICOTINE 14 MG/24HR TD PT24: 14.0000 mg | MEDICATED_PATCH | Freq: Every day | TRANSDERMAL | 0 refills | Status: DC

## 2016-07-21 MED ORDER — POTASSIUM CHLORIDE ER 10 MEQ PO TBCR: 20.0000 meq | EXTENDED_RELEASE_TABLET | Freq: Every day | ORAL | 1 refills | Status: DC

## 2016-07-21 MED ORDER — THIAMINE HCL 100 MG PO TABS: 100.0000 mg | ORAL_TABLET | Freq: Every day | ORAL | 1 refills | Status: DC

## 2016-07-21 MED ORDER — FOLIC ACID 1 MG PO TABS: 1.0000 mg | ORAL_TABLET | Freq: Every day | ORAL | 1 refills | Status: DC

## 2016-07-21 NOTE — Discharge Summary (Addendum)
Physician Discharge Summary  Kendra Hernandez MRN: [id number] DOB/AGE: Jan 10, 1941 75 y.o.  PCP: [name], MD   Admit date: 07/16/2016 Discharge date: 07/21/2016  Discharge Diagnoses:    Principal Problem:   Major depression, chronic ([place]) Active Problems:   Essential hypertension, benign   Esophageal reflux   Alcohol withdrawal (HCC)   Withdrawal symptoms, alcohol (HCC)   Hypothyroidism   History of CVA (cerebrovascular accident)   Head contusion   Fall   Insomnia   Essential hypertension    Follow-up recommendations Follow-up with PCP in 3-5 days , including all  additional recommended appointments as below Follow-up CBC, CMP in 3-5 days Patient left AGAINST MEDICAL ADVICE Adult Protective Services requested by social worker Patient has capacity to make medical/informed decision at this time, she is aware of her issues and verbalizes the need to get help.    Current Discharge Medication List    START taking these medications   Details  folic acid (FOLVITE) 1 MG tablet Take 1 tablet (1 mg total) by mouth daily. Qty: 30 tablet, Refills: 1    nicotine (NICODERM CQ - DOSED IN MG/24 HOURS) 14 mg/24hr patch Place 1 patch (14 mg total) onto the skin daily. Qty: 28 patch, Refills: 0    potassium chloride (K-DUR) 10 MEQ tablet Take 2 tablets (20 mEq total) by mouth daily. Qty: 60 tablet, Refills: 1    thiamine 100 MG tablet Take 1 tablet (100 mg total) by mouth daily. Qty: 30 tablet, Refills: 1      CONTINUE these medications which have NOT CHANGED   Details  acetaminophen (TYLENOL ARTHRITIS PAIN) 650 MG CR tablet Take 650 mg by mouth every 8 (eight) hours as needed for pain.    aspirin EC 81 MG tablet Take 81 mg by mouth daily. With breakfast    atorvastatin (LIPITOR) 10 MG tablet TK 1 T PO QD Refills: 2    baclofen (LIORESAL) 10 MG tablet Take 10 mg by mouth 3 (three) times daily.    hydrALAZINE (APRESOLINE) 50 MG tablet Take 50 mg by mouth 2 (two) times  daily.    labetalol (NORMODYNE) 200 MG tablet Take 200 mg by mouth 2 (two) times daily.    levothyroxine (SYNTHROID, LEVOTHROID) 50 MCG tablet Take 50 mcg by mouth daily before breakfast.    loratadine (CLARITIN) 10 MG tablet Take 10 mg by mouth daily.    omeprazole (PRILOSEC) 40 MG capsule Take 40 mg by mouth daily.    sucralfate (CARAFATE) 1 g tablet Take 1 g by mouth 4 (four) times daily -  with meals and at bedtime.    traZODone (DESYREL) 100 MG tablet Take 200 mg by mouth at bedtime. 2 by mouth every evening    venlafaxine (EFFEXOR) 100 MG tablet Take 100 mg by mouth 2 (two) times daily.          Discharge Condition: Prognosis poor as  patient left AMA and will probably continue to drink   Discharge Instructions Get Medicines reviewed and adjusted: Please take all your medications with you for your next visit with your Primary MD  Please request your Primary MD to go over all hospital tests and procedure/radiological results at the follow up, please ask your Primary MD to get all Hospital records sent to his/her office.  If you experience worsening of your admission symptoms, develop shortness of breath, life threatening emergency, suicidal or homicidal thoughts you must seek medical attention immediately by calling 911 or calling your MD immediately if symptoms  less severe.  You must read complete instructions/literature along with all the possible adverse reactions/side effects for all the Medicines you take and that have been prescribed to you. Take any new Medicines after you have completely understood and accpet all the possible adverse reactions/side effects.   Do not drive when taking Pain medications.   Do not take more than prescribed Pain, Sleep and Anxiety Medications  Special Instructions: If you have smoked or chewed Tobacco in the last 2 yrs please stop smoking, stop any regular Alcohol and or any Recreational drug use.  Wear Seat belts while  driving.  Please note  You were cared for by a hospitalist during your hospital stay. Once you are discharged, your primary care physician will handle any further medical issues. Please note that NO REFILLS for any discharge medications will be authorized once you are discharged, as it is imperative that you return to your primary care physician (or establish a relationship with a primary care physician if you do not have one) for your aftercare needs so that they can reassess your need for medications and monitor your lab values.     Allergies  Allergen Reactions  . Ceftin [Cefuroxime Axetil]   . Penicillins Hives    Hives and swelling as a child   . Simvastatin     Patient can not recall side effect       Disposition: 01-Home or Self Care   Consults:  Psychiatry    Significant Diagnostic Studies:  Dg Shoulder Right  Result Date: 07/16/2016 CLINICAL DATA:  Pain following fall EXAM: RIGHT SHOULDER - 2+ VIEW COMPARISON:  None. FINDINGS: Frontal and Y scapular views were obtained. There is no acute fracture or dislocation. There is osteoarthritic change in the glenohumeral joint and to a lesser extent in the acromioclavicular joint. No erosive change or intra-articular calcification. Visualized right is clear. IMPRESSION: Areas of osteoarthritic change.  No fracture or dislocation. Electronically Signed   By: Lowella Grip III M.D.   On: 07/16/2016 17:56   Ct Head Wo Contrast  Result Date: 07/16/2016 CLINICAL DATA:  Pain following fall after episode of dizziness. EXAM: CT HEAD WITHOUT CONTRAST CT MAXILLOFACIAL WITHOUT CONTRAST CT CERVICAL SPINE WITHOUT CONTRAST TECHNIQUE: Multidetector CT imaging of the head, cervical spine, and maxillofacial structures were performed using the standard protocol without intravenous contrast. Multiplanar CT image reconstructions of the cervical spine and maxillofacial structures were also generated. COMPARISON:  Head CT in knee July 15, 2016  FINDINGS: CT HEAD FINDINGS Mild diffuse atrophy is stable. There is no intracranial mass, hemorrhage, extra-axial fluid collection, or midline shift. There is evidence of an old infarct involving much of the right temporal lobe as well as portions of posterior right lentiform nucleus and portions of the right internal capsule. Prior infarct also involves a portion of the lateral aspect of the anterior right occipital lobe. No acute infarct is evident. There is calcification in the periphery of the M2 segment of the right middle cerebral artery, likely a chronic thrombus. There are foci of calcification in each distal vertebral artery as well as in the carotid siphon regions bilaterally. No hyperdense vessel is currently evident. The bony calvarium appears intact. There is a large hematoma overlying anterior frontal bone and superior and lateral to the orbit on the left. This hematoma measures 9.6 x 5.6 x 4.0 cm. There is extensive preseptal soft tissue edema over the left orbit as well. Mastoid air cells are clear except for a few opacified mastoid air  cells on the left inferiorly. CT MAXILLOFACIAL FINDINGS There is evidence of prior surgical fixation in the mandible on both right and left sides. There is extensive soft tissue edema over the preseptal left orbit as well as over the left frontal bone anteriorly as noted in the head CT report above. There is no acute fracture or dislocation on the current examination. There is mucosal thickening in several ethmoid air cells bilaterally. Other paranasal sinuses are clear. There is no air-fluid level. No bony destruction or expansion. Ostiomeatal unit complexes are patent bilaterally. There is mild leftward deviation of the nasal septum. There is no nares obstruction on either side. There is no turbinate edema evident. Although not fractured, there is anterior subluxation of the right mandibular condyle with questionable dislocation of the right mandibular condyle. The  left mandibular condyle is positioned within the temporal eminence. No other dislocation or subluxation evident. No adenopathy is appreciable. Visualized salivary glands appear symmetric bilaterally. There are multiple foci of carotid artery calcification bilaterally. CT CERVICAL SPINE FINDINGS There is no demonstrable fracture or spondylolisthesis. There is cervical levoscoliosis. Prevertebral soft tissues and predental space regions are normal. There is moderate disc space narrowing C5-6 and C6-7. There is milder disc space narrowing at C4-5. There is multilevel facet hypertrophy. Exit foraminal narrowing due to bony hypertrophy is noted at multiple levels. No high-grade stenosis evident. There are multiple foci of carotid artery calcification bilaterally. IMPRESSION: There are multiple foci of carotid artery calcification. CT head: Extensive soft tissue hematoma over the left frontal bone and preseptal left orbit. No calvarial fracture. Prior infarcts involving much of the right temporal lobe as well as portions of the right lentiform nucleus and right internal capsule. Infarct also extends into the anterior right occipital lobe. These infarcts are stable. No acute infarct is evident. No intracranial mass, hemorrhage, or extra-axial fluid collection. Calcification in the M2 segment of the right middle cerebral artery likely represents prior thrombus. Several inferior mastoid air cells on the left are opacified. Most the mastoids bilaterally are clear. CT maxillofacial: Old trauma involving the mandible with surgical fixation. Subluxation with possible dislocation of the right mandibular condyles with respect the right temporal eminence. Extensive soft tissue swelling over the preseptal left orbit left frontal bone anteriorly without fracture in this region. No intraorbital lesions are evident. There is mild mucosal thickening in several ethmoid air cells. Other paranasal sinuses are clear. No air-fluid level. No  bony destruction or expansion. Ostiomeatal unit complexes patent bilaterally. Mild leftward deviation nasal septum. CT cervical spine: No fracture or spondylolisthesis. Multilevel arthropathy. Foci of carotid artery calcification noted. Electronically Signed   By: Lowella Grip III M.D.   On: 07/16/2016 19:09   Ct Head Wo Contrast  Result Date: 07/15/2016 CLINICAL DATA:  Dizziness leading to fall. No loss of consciousness. Abrasion to posterior head. EXAM: CT HEAD WITHOUT CONTRAST TECHNIQUE: Contiguous axial images were obtained from the base of the skull through the vertex without intravenous contrast. COMPARISON:  None. FINDINGS: Brain: No intracranial hemorrhage, mass effect, or midline shift. Remote lacunar infarcts in the right basal ganglia and encephalomalacia in the right temporal occipital lobe likely a sequela of prior MCA distribution infarct. No hydrocephalus. The basilar cisterns are patent. No evidence of acute ischemia. No intracranial fluid collection. Vascular: No hyperdense vessel. Calcification but the distal right M1 branch may be sequela of prior thrombus. There is atherosclerosis of skullbase vasculature. Skull: Small right posterior scalp edema/hematoma. Calvarium is intact. Sinuses/Orbits: Included paranasal sinuses and  mastoid air cells are well aerated. Other: None. IMPRESSION: 1.  No acute intracranial abnormality. 2. Remote lacunar infarcts in the right basal ganglia and sequela of remote MCA distribution infarct. Electronically Signed   By: Jeb Levering M.D.   On: 07/15/2016 20:18   Ct Cervical Spine Wo Contrast  Result Date: 07/16/2016 CLINICAL DATA:  Pain following fall after episode of dizziness. EXAM: CT HEAD WITHOUT CONTRAST CT MAXILLOFACIAL WITHOUT CONTRAST CT CERVICAL SPINE WITHOUT CONTRAST TECHNIQUE: Multidetector CT imaging of the head, cervical spine, and maxillofacial structures were performed using the standard protocol without intravenous contrast.  Multiplanar CT image reconstructions of the cervical spine and maxillofacial structures were also generated. COMPARISON:  Head CT in knee July 15, 2016 FINDINGS: CT HEAD FINDINGS Mild diffuse atrophy is stable. There is no intracranial mass, hemorrhage, extra-axial fluid collection, or midline shift. There is evidence of an old infarct involving much of the right temporal lobe as well as portions of posterior right lentiform nucleus and portions of the right internal capsule. Prior infarct also involves a portion of the lateral aspect of the anterior right occipital lobe. No acute infarct is evident. There is calcification in the periphery of the M2 segment of the right middle cerebral artery, likely a chronic thrombus. There are foci of calcification in each distal vertebral artery as well as in the carotid siphon regions bilaterally. No hyperdense vessel is currently evident. The bony calvarium appears intact. There is a large hematoma overlying anterior frontal bone and superior and lateral to the orbit on the left. This hematoma measures 9.6 x 5.6 x 4.0 cm. There is extensive preseptal soft tissue edema over the left orbit as well. Mastoid air cells are clear except for a few opacified mastoid air cells on the left inferiorly. CT MAXILLOFACIAL FINDINGS There is evidence of prior surgical fixation in the mandible on both right and left sides. There is extensive soft tissue edema over the preseptal left orbit as well as over the left frontal bone anteriorly as noted in the head CT report above. There is no acute fracture or dislocation on the current examination. There is mucosal thickening in several ethmoid air cells bilaterally. Other paranasal sinuses are clear. There is no air-fluid level. No bony destruction or expansion. Ostiomeatal unit complexes are patent bilaterally. There is mild leftward deviation of the nasal septum. There is no nares obstruction on either side. There is no turbinate edema evident.  Although not fractured, there is anterior subluxation of the right mandibular condyle with questionable dislocation of the right mandibular condyle. The left mandibular condyle is positioned within the temporal eminence. No other dislocation or subluxation evident. No adenopathy is appreciable. Visualized salivary glands appear symmetric bilaterally. There are multiple foci of carotid artery calcification bilaterally. CT CERVICAL SPINE FINDINGS There is no demonstrable fracture or spondylolisthesis. There is cervical levoscoliosis. Prevertebral soft tissues and predental space regions are normal. There is moderate disc space narrowing C5-6 and C6-7. There is milder disc space narrowing at C4-5. There is multilevel facet hypertrophy. Exit foraminal narrowing due to bony hypertrophy is noted at multiple levels. No high-grade stenosis evident. There are multiple foci of carotid artery calcification bilaterally. IMPRESSION: There are multiple foci of carotid artery calcification. CT head: Extensive soft tissue hematoma over the left frontal bone and preseptal left orbit. No calvarial fracture. Prior infarcts involving much of the right temporal lobe as well as portions of the right lentiform nucleus and right internal capsule. Infarct also extends into the anterior right  occipital lobe. These infarcts are stable. No acute infarct is evident. No intracranial mass, hemorrhage, or extra-axial fluid collection. Calcification in the M2 segment of the right middle cerebral artery likely represents prior thrombus. Several inferior mastoid air cells on the left are opacified. Most the mastoids bilaterally are clear. CT maxillofacial: Old trauma involving the mandible with surgical fixation. Subluxation with possible dislocation of the right mandibular condyles with respect the right temporal eminence. Extensive soft tissue swelling over the preseptal left orbit left frontal bone anteriorly without fracture in this region. No  intraorbital lesions are evident. There is mild mucosal thickening in several ethmoid air cells. Other paranasal sinuses are clear. No air-fluid level. No bony destruction or expansion. Ostiomeatal unit complexes patent bilaterally. Mild leftward deviation nasal septum. CT cervical spine: No fracture or spondylolisthesis. Multilevel arthropathy. Foci of carotid artery calcification noted. Electronically Signed   By: Lowella Grip III M.D.   On: 07/16/2016 19:09   Ct Maxillofacial Wo Contrast  Result Date: 07/16/2016 CLINICAL DATA:  Pain following fall after episode of dizziness. EXAM: CT HEAD WITHOUT CONTRAST CT MAXILLOFACIAL WITHOUT CONTRAST CT CERVICAL SPINE WITHOUT CONTRAST TECHNIQUE: Multidetector CT imaging of the head, cervical spine, and maxillofacial structures were performed using the standard protocol without intravenous contrast. Multiplanar CT image reconstructions of the cervical spine and maxillofacial structures were also generated. COMPARISON:  Head CT in knee July 15, 2016 FINDINGS: CT HEAD FINDINGS Mild diffuse atrophy is stable. There is no intracranial mass, hemorrhage, extra-axial fluid collection, or midline shift. There is evidence of an old infarct involving much of the right temporal lobe as well as portions of posterior right lentiform nucleus and portions of the right internal capsule. Prior infarct also involves a portion of the lateral aspect of the anterior right occipital lobe. No acute infarct is evident. There is calcification in the periphery of the M2 segment of the right middle cerebral artery, likely a chronic thrombus. There are foci of calcification in each distal vertebral artery as well as in the carotid siphon regions bilaterally. No hyperdense vessel is currently evident. The bony calvarium appears intact. There is a large hematoma overlying anterior frontal bone and superior and lateral to the orbit on the left. This hematoma measures 9.6 x 5.6 x 4.0 cm. There is  extensive preseptal soft tissue edema over the left orbit as well. Mastoid air cells are clear except for a few opacified mastoid air cells on the left inferiorly. CT MAXILLOFACIAL FINDINGS There is evidence of prior surgical fixation in the mandible on both right and left sides. There is extensive soft tissue edema over the preseptal left orbit as well as over the left frontal bone anteriorly as noted in the head CT report above. There is no acute fracture or dislocation on the current examination. There is mucosal thickening in several ethmoid air cells bilaterally. Other paranasal sinuses are clear. There is no air-fluid level. No bony destruction or expansion. Ostiomeatal unit complexes are patent bilaterally. There is mild leftward deviation of the nasal septum. There is no nares obstruction on either side. There is no turbinate edema evident. Although not fractured, there is anterior subluxation of the right mandibular condyle with questionable dislocation of the right mandibular condyle. The left mandibular condyle is positioned within the temporal eminence. No other dislocation or subluxation evident. No adenopathy is appreciable. Visualized salivary glands appear symmetric bilaterally. There are multiple foci of carotid artery calcification bilaterally. CT CERVICAL SPINE FINDINGS There is no demonstrable fracture or spondylolisthesis. There  is cervical levoscoliosis. Prevertebral soft tissues and predental space regions are normal. There is moderate disc space narrowing C5-6 and C6-7. There is milder disc space narrowing at C4-5. There is multilevel facet hypertrophy. Exit foraminal narrowing due to bony hypertrophy is noted at multiple levels. No high-grade stenosis evident. There are multiple foci of carotid artery calcification bilaterally. IMPRESSION: There are multiple foci of carotid artery calcification. CT head: Extensive soft tissue hematoma over the left frontal bone and preseptal left orbit. No  calvarial fracture. Prior infarcts involving much of the right temporal lobe as well as portions of the right lentiform nucleus and right internal capsule. Infarct also extends into the anterior right occipital lobe. These infarcts are stable. No acute infarct is evident. No intracranial mass, hemorrhage, or extra-axial fluid collection. Calcification in the M2 segment of the right middle cerebral artery likely represents prior thrombus. Several inferior mastoid air cells on the left are opacified. Most the mastoids bilaterally are clear. CT maxillofacial: Old trauma involving the mandible with surgical fixation. Subluxation with possible dislocation of the right mandibular condyles with respect the right temporal eminence. Extensive soft tissue swelling over the preseptal left orbit left frontal bone anteriorly without fracture in this region. No intraorbital lesions are evident. There is mild mucosal thickening in several ethmoid air cells. Other paranasal sinuses are clear. No air-fluid level. No bony destruction or expansion. Ostiomeatal unit complexes patent bilaterally. Mild leftward deviation nasal septum. CT cervical spine: No fracture or spondylolisthesis. Multilevel arthropathy. Foci of carotid artery calcification noted. Electronically Signed   By: Lowella Grip III M.D.   On: 07/16/2016 19:09      Filed Weights   07/17/16 2037 07/19/16 2109 07/20/16 2040  Weight: 52 kg (114 lb 10.2 oz) 53.8 kg (118 lb 9.7 oz) 53.7 kg (118 lb 6.2 oz)     Microbiology: No results found for this or any previous visit (from the past 240 hour(s)).     Blood Culture No results found for: SDES, Cade, CULT, REPTSTATUS    Labs: Results for orders placed or performed during the hospital encounter of 07/16/16 (from the past 48 hour(s))  Urine rapid drug screen (hosp performed)     Status: Abnormal   Collection Time: 07/19/16  5:00 PM  Result Value Ref Range   Opiates NONE DETECTED NONE DETECTED    Cocaine NONE DETECTED NONE DETECTED   Benzodiazepines POSITIVE (A) NONE DETECTED   Amphetamines NONE DETECTED NONE DETECTED   Tetrahydrocannabinol NONE DETECTED NONE DETECTED   Barbiturates NONE DETECTED NONE DETECTED    Comment:        DRUG SCREEN FOR MEDICAL PURPOSES ONLY.  IF CONFIRMATION IS NEEDED FOR ANY PURPOSE, NOTIFY LAB WITHIN 5 DAYS.        LOWEST DETECTABLE LIMITS FOR URINE DRUG SCREEN Drug Class       Cutoff (ng/mL) Amphetamine      1000 Barbiturate      200 Benzodiazepine   338 Tricyclics       250 Opiates          300 Cocaine          300 THC              50   Comprehensive metabolic panel     Status: Abnormal   Collection Time: 07/20/16  5:38 AM  Result Value Ref Range   Sodium 139 135 - 145 mmol/L   Potassium 3.7 3.5 - 5.1 mmol/L   Chloride 106 101 - 111 mmol/L  CO2 25 22 - 32 mmol/L   Glucose, Bld 107 (H) 65 - 99 mg/dL   BUN 5 (L) 6 - 20 mg/dL   Creatinine, Ser 0.53 0.44 - 1.00 mg/dL   Calcium 9.1 8.9 - 10.3 mg/dL   Total Protein 5.6 (L) 6.5 - 8.1 g/dL   Albumin 3.2 (L) 3.5 - 5.0 g/dL   AST 16 15 - 41 U/L   ALT 11 (L) 14 - 54 U/L   Alkaline Phosphatase 50 38 - 126 U/L   Total Bilirubin 0.8 0.3 - 1.2 mg/dL   GFR calc non Af Amer >60 >60 mL/min   GFR calc Af Amer >60 >60 mL/min    Comment: (NOTE) The eGFR has been calculated using the CKD EPI equation. This calculation has not been validated in all clinical situations. eGFR's persistently <60 mL/min signify possible Chronic Kidney Disease.    Anion gap 8 5 - 15     Lipid Panel     Component Value Date/Time   CHOL 279 (A) 01/02/2016   TRIG 149 01/02/2016   HDL 49 01/02/2016   LDLCALC 201 01/02/2016     Lab Results  Component Value Date   HGBA1C 5.1 01/02/2016     Lab Results  Component Value Date   LDLCALC 201 01/02/2016   CREATININE 0.53 07/20/2016     HPI :  Kendra Hernandez is a 75 y.o. female with medical history significant of CVA, repeated falls, HTN, HLD, insomnia,  hypothyroidism, GERD, COPD, EtOH abuse.  5 caveat applies. History provided on a limited basis by the patient due to her somnolent state after head trauma and Ativan due to alcohol withdrawal. Patient will wake up for short period of time and answer questions appropriately but only able to obtain very limited information. Information provided by EDP, EMS, and nursing staff.  Patient was brought to [facility] ED on 07/16/2016 after sustaining multiple falls. Patient was initially found by a neighbor lying on the ground with a laceration to the left side of her head. Patient has no recollection of the fall. She was by herself. Patient does endorse drinking on the day of the injury. Of note patient was seen the day prior to that ED visit with a similar presentation, fall. At that time patient had fallen and struck the back of her head. At time of my evaluation on 07/17/2016 patient endorses daily alcohol intake of a half a beer per day but states that she may have had more than that on the day of her fall and subsequent head injury.  ED Course: Patient has had a lengthy course in the emergency room extending over an entire day. ENT was initially consult, and evaluate the patient for admission. Imaging showed that there only soft tissue injuries to the patient's head and there is no evidence of intracranial hemorrhaging or facial fractures. ENT deferred admission at that time and recommended a compression dressing with ice.   There are significant psychosocial problems as the day wore on this patient was unsafe to be able to go home. Patient's next of kin, her son is currently on vacation out of the country and is unable to provide additional assistance for the patient. Per patient's son the patient is safe and able to live at home when she is not drinking alcohol however she has proven to be incapable of this over the last several days due to multiple falls and elevated blood alcohol levels. Patient stated  the emergency room overnight. Over the  course of the night patient began to go through alcohol withdrawal with significant agitation and tremor and elevated heart rate and blood pressure  HOSPITAL COURSE:   ETOH intoxication/withdrawal -no witnessed onset of withdrawal     Alcohol found that patient's home by EMS when she was picked up on the 18th. EtOH level was 172 on admission,  patient did not have any signs of withdrawal except when she was in the ER Patient in the ED since the 18th but developed tremor and agitation.   relieved with Ativan. Telemetry uneventful , currently does not seem to be in alcohol withdrawal,    psychiatric consultation to determine if she has capacity, this consult was called in on 7/21  She denies suicidal/j\homicidal thoughts, delusions or psychosis. Patient is requesting for help with her depression and Alcohol. She is alert and oriented x4. Patient is aware of her environment, aware of being depressed as well as abusing Alcohol, consequences of drinking and falling and she  Verbalizes the need to get help. Patient is not a danger to herself or others at this time.    Falls: Secondary to alcohol intoxication. Seen in ED on 717 and 718 for falls. 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 Given multiple old infarcts ordered MRI of the brain but patient adamantly refusing- PT/OT recommended SNF, social worker consultation requested for Adult Protective Services,     Head contusion:Extensive soft tissue hematoma over the left frontal bone and preseptal left orbit. Fortunately no bony fracture including orbit and no intracranial hemorrhaging. All injury simply too soft tissue structures on the left side of the head. Suspect patient likely also has a concussion given her foggy memory though this is clouded due to the EtOH use. Evaluated by Trauma and ENT who gave recommendations but opted not to follow as patient can be managed without their assistance.     Depression Changer Effexor to Effexor XR 128m daily for depression. -Patient has capacity to make medical/informed decision at this time, she is aware of her issues and verbalizes the need to get help.    Hypokalemia-repleted   HTN: - continue hydralazine, labetalol  Hypothyroid: - conitnue synthroid, normal TSH, free T4  GERD: - continue ppi  Insomnia: - continue trazodone  Depression: - continue effexor  HLD: - continue statin  MSK pain:  - continue baclofen     Blood pressure (!) 154/58, pulse (!) 53, temperature 97.6 F (36.4 C), temperature source Oral, resp. rate 16, height _0  (1.626 m), weight 53.7 kg (118 lb 6.2 oz), SpO2 97 %.  Cardiovascular: RRR, III/VI systolic murmur. No LE edema.   Respiratory: CTA bilaterally, no w/r/r. Normal respiratory effort.  Abdomen: soft, ntnd, NABS  Skin: Significant left facial abrasions and edema and ecchymoses with pressure dressing in place.   Musculoskeletal: grossly normal tone BUE/BLE, good ROM, no bony abnormality  Psychiatric: Difficult to fully ascertain due to patient being heavily medicated. Somnolent. She pushes appropriately. Neurologic: Again difficult to fully assess due to patient being heavily medicated. Results from these in coordinated fashion. Sensation intact.      Follow-up Information    POLITE,RONALD D, MD. Schedule an appointment as soon as possible for a visit in 2 day(s).   Specialty:  Internal Medicine Why:  Hospital follow-up Contact information: [address] [phone]           Signed: AReyne Dumas7/23/2017, 11:33 AM        Time  spent >45 mins

## 2016-07-21 NOTE — Progress Notes (Signed)
Date: 07/21/2016 Patient: Kendra Hernandez Admitted: 07/16/2016  4:48 PM Attending Provider: Reyne Dumas, MD  Kendra Hernandez or her authorized caregiver has made the decision for the patient to leave [facility]) against the advice of Reyne Dumas, MD.  She or her authorized caregiver has been informed and understands the inherent risks, including death.  She or her authorized caregiver has decided to accept the responsibility for this decision. Kendra Hernandez and all necessary parties have been advised that she may return for further evaluation or treatment. Her condition at time of discharge was Fair.  Kendra Hernandez had current vital signs as follows:  Blood pressure (!) 154/58, pulse (!) 53, temperature 97.6 F (36.4 C), temperature source Oral, resp. rate 16, height 5\' 4"  (1.626 m), weight 53.7 kg (118 lb 6.2 oz), SpO2 97 %.   Kendra Hernandez or her authorized caregiver has signed the Leaving Against Medical Advice form prior to leaving the department.  Madeleine Fenn R 07/21/2016

## 2016-07-21 NOTE — Progress Notes (Signed)
Patient signing out AMA.  AVS reviewed, including activity, diet, follow-up appoints, and medications. Patient verbalized understanding of information on AVS. IV access was d/c'd. Vitals are stable. Skin is intact except as charted in most recent assessments. Pt to be escorted out by RN, to be driven home by friend of family, Hilliard Clark.  Jillyn Ledger, MBA, BSN, RN

## 2016-07-21 NOTE — Care Management (Signed)
This CM met patient on admission in the ED, patient's next of kin son Gene Lavonna Rua [phone] who is out of the country in [place]  Patient lives alone, she has decided to leave againist medical advise, and is requesting taxi voucher.  CM asked permission to contact someone concerning transport home. CM contacted patient's son regarding discharge, he states, he will contact a friend Hilliard [phone] will come to pick her up. CM spoke with Hilliard Clark who confirmed that he will come to pick patient up and transport her home. Updated Jonni Sanger RN.

## 2016-07-21 NOTE — Clinical Social Work Note (Signed)
CSW attempted to meet with patient regarding SNF recommendation. The patient is refusing any type of placement however she did accept substance abuse resources that were offered. CSW signing off at this time as the patient plans to leave AMA.   Liz Beach MSW, [place], [place], JI:[phone]

## 2016-07-21 NOTE — Discharge Instructions (Signed)
Alcohol Use Disorder °Alcohol use disorder is a mental disorder. It is not a one-time incident of heavy drinking. Alcohol use disorder is the excessive and uncontrollable use of alcohol over time that leads to problems with functioning in one or more areas of daily living. People with this disorder risk harming themselves and others when they drink to excess. Alcohol use disorder also can cause other mental disorders, such as mood and anxiety disorders, and serious physical problems. People with alcohol use disorder often misuse other drugs.  °Alcohol use disorder is common and widespread. Some people with this disorder drink alcohol to cope with or escape from negative life events. Others drink to relieve chronic pain or symptoms of mental illness. People with a family history of alcohol use disorder are at higher risk of losing control and using alcohol to excess.  °Drinking too much alcohol can cause injury, accidents, and health problems. One drink can be too much when you are: °· Working. °· Pregnant or breastfeeding. °· Taking medicines. Ask your doctor. °· Driving or planning to drive. °SYMPTOMS  °Signs and symptoms of alcohol use disorder may include the following:  °· Consumption of alcohol in larger amounts or over a longer period of time than intended. °· Multiple unsuccessful attempts to cut down or control alcohol use.   °· A great deal of time spent obtaining alcohol, using alcohol, or recovering from the effects of alcohol (hangover). °· A strong desire or urge to use alcohol (cravings).   °· Continued use of alcohol despite problems at work, school, or home because of alcohol use.   °· Continued use of alcohol despite problems in relationships because of alcohol use. °· Continued use of alcohol in situations when it is physically hazardous, such as driving a car. °· Continued use of alcohol despite awareness of a physical or psychological problem that is likely related to alcohol use. Physical  problems related to alcohol use can involve the brain, heart, liver, stomach, and intestines. Psychological problems related to alcohol use include intoxication, depression, anxiety, psychosis, delirium, and dementia.   °· The need for increased amounts of alcohol to achieve the same desired effect, or a decreased effect from the consumption of the same amount of alcohol (tolerance). °· Withdrawal symptoms upon reducing or stopping alcohol use, or alcohol use to reduce or avoid withdrawal symptoms. Withdrawal symptoms include: °¨ Racing heart. °¨ Hand tremor. °¨ Difficulty sleeping. °¨ Nausea. °¨ Vomiting. °¨ Hallucinations. °¨ Restlessness. °¨ Seizures. °DIAGNOSIS °Alcohol use disorder is diagnosed through an assessment by your health care provider. Your health care provider may start by asking three or four questions to screen for excessive or problematic alcohol use. To confirm a diagnosis of alcohol use disorder, at least two symptoms must be present within a 12-month period. The severity of alcohol use disorder depends on the number of symptoms: °· Mild--two or three. °· Moderate--four or five. °· Severe--six or more. °Your health care provider may perform a physical exam or use results from lab tests to see if you have physical problems resulting from alcohol use. Your health care provider may refer you to a mental health professional for evaluation. °TREATMENT  °Some people with alcohol use disorder are able to reduce their alcohol use to low-risk levels. Some people with alcohol use disorder need to quit drinking alcohol. When necessary, mental health professionals with specialized training in substance use treatment can help. Your health care provider can help you decide how severe your alcohol use disorder is and what type of treatment you need.   The following forms of treatment are available:   Detoxification. Detoxification involves the use of prescription medicines to prevent alcohol withdrawal  symptoms in the first week after quitting. This is important for people with a history of symptoms of withdrawal and for heavy drinkers who are likely to have withdrawal symptoms. Alcohol withdrawal can be dangerous and, in severe cases, cause death. Detoxification is usually provided in a hospital or in-patient substance use treatment facility.  Counseling or talk therapy. Talk therapy is provided by substance use treatment counselors. It addresses the reasons people use alcohol and ways to keep them from drinking again. The goals of talk therapy are to help people with alcohol use disorder find healthy activities and ways to cope with life stress, to identify and avoid triggers for alcohol use, and to handle cravings, which can cause relapse.  Medicines.Different medicines can help treat alcohol use disorder through the following actions:  Decrease alcohol cravings.  Decrease the positive reward response felt from alcohol use.  Produce an uncomfortable physical reaction when alcohol is used (aversion therapy).  Support groups. Support groups are run by people who have quit drinking. They provide emotional support, advice, and guidance. These forms of treatment are often combined. Some people with alcohol use disorder benefit from intensive combination treatment provided by specialized substance use treatment centers. Both inpatient and outpatient treatment programs are available.   This information is not intended to replace advice given to you by your health care provider. Make sure you discuss any questions you have with your health care provider.   Document Released: 01/23/2005 Document Revised: 01/06/2015 Document Reviewed: 03/25/2013 Elsevier Interactive Patient Education 2016 [place] in the [place]  Falls can cause injuries and can affect people from all age groups. There are many simple things that you can do to make your home safe and to help prevent  falls. WHAT CAN I DO ON THE OUTSIDE OF MY HOME?  Regularly repair the edges of walkways and driveways and fix any cracks.  Remove high doorway thresholds.  Trim any shrubbery on the main path into your home.  Use bright outdoor lighting.  Clear walkways of debris and clutter, including tools and rocks.  Regularly check that handrails are securely fastened and in good repair. Both sides of any steps should have handrails.  Install guardrails along the edges of any raised decks or porches.  Have leaves, snow, and ice cleared regularly.  Use sand or salt on walkways during winter months.  In the garage, clean up any spills right away, including grease or oil spills. WHAT CAN I DO IN THE BATHROOM?  Use night lights.  Install grab bars by the toilet and in the tub and shower. Do not use towel bars as grab bars.  Use non-skid mats or decals on the floor of the tub or shower.  If you need to sit down while you are in the shower, use a plastic, non-slip stool.[place]land Kitchen  Keep the floor dry. Immediately clean up any water that spills on the floor.  Remove soap buildup in the tub or shower on a regular basis.  Attach bath mats securely with double-sided non-slip rug tape.  Remove throw rugs and other tripping hazards from the floor. WHAT CAN I DO IN THE BEDROOM?  Use night lights.  Make sure that a bedside light is easy to reach.  Do not use oversized bedding that drapes onto the floor.  Have a firm chair that has side  arms to use for getting dressed.  Remove throw rugs and other tripping hazards from the floor. WHAT CAN I DO IN THE KITCHEN?   Clean up any spills right away.  Avoid walking on wet floors.  Place frequently used items in easy-to-reach places.  If you need to reach for something above you, use a sturdy step stool that has a grab bar.  Keep electrical cables out of the way.  Do not use floor polish or wax that makes floors slippery. If you have to use wax, make  sure that it is non-skid floor wax.  Remove throw rugs and other tripping hazards from the floor. WHAT CAN I DO IN THE STAIRWAYS?  Do not leave any items on the stairs.  Make sure that there are handrails on both sides of the stairs. Fix handrails that are broken or loose. Make sure that handrails are as long as the stairways.  Check any carpeting to make sure that it is firmly attached to the stairs. Fix any carpet that is loose or worn.  Avoid having throw rugs at the top or bottom of stairways, or secure the rugs with carpet tape to prevent them from moving.  Make sure that you have a light switch at the top of the stairs and the bottom of the stairs. If you do not have them, have them installed. WHAT ARE SOME OTHER FALL PREVENTION TIPS?  Wear closed-toe shoes that fit well and support your feet. Wear shoes that have rubber soles or low heels.  When you use a stepladder, make sure that it is completely opened and that the sides are firmly locked. Have someone hold the ladder while you are using it. Do not climb a closed stepladder.  Add color or contrast paint or tape to grab bars and handrails in your home. Place contrasting color strips on the first and last steps.  Use mobility aids as needed, such as canes, walkers, scooters, and crutches.  Turn on lights if it is dark. Replace any light bulbs that burn out.  Set up furniture so that there are clear paths. Keep the furniture in the same spot.  Fix any uneven floor surfaces.  Choose a carpet design that does not hide the edge of steps of a stairway.  Be aware of any and all pets.  Review your medicines with your healthcare provider. Some medicines can cause dizziness or changes in blood pressure, which increase your risk of falling. Talk with your health care provider about other ways that you can decrease your risk of falls. This may include working with a physical therapist or trainer to improve your strength, balance, and  endurance.   This information is not intended to replace advice given to you by your health care provider. Make sure you discuss any questions you have with your health care provider.   Document Released: 12/06/2002 Document Revised: 05/02/2015 Document Reviewed: 01/20/2015 Elsevier Interactive Patient Education [employer].

## 2016-07-26 DIAGNOSIS — S0003XA Contusion of scalp, initial encounter: Secondary | ICD-10-CM | POA: Insufficient documentation

## 2016-07-28 ENCOUNTER — Emergency Department (HOSPITAL_COMMUNITY)
Admission: EM | Admit: 2016-07-28 | Discharge: 2016-07-28 | Discharge status: Against Medical Advice | Payer: Medicare Other | Attending: Emergency Medicine | Admitting: Emergency Medicine

## 2016-07-28 ENCOUNTER — Emergency Department (HOSPITAL_COMMUNITY): Payer: Medicare Other

## 2016-07-28 ENCOUNTER — Encounter (HOSPITAL_COMMUNITY): Payer: Self-pay | Admitting: Emergency Medicine

## 2016-07-28 DIAGNOSIS — J449 Chronic obstructive pulmonary disease, unspecified: Secondary | ICD-10-CM | POA: Insufficient documentation

## 2016-07-28 DIAGNOSIS — R0789 Other chest pain: Secondary | ICD-10-CM | POA: Diagnosis not present

## 2016-07-28 DIAGNOSIS — F17210 Nicotine dependence, cigarettes, uncomplicated: Secondary | ICD-10-CM | POA: Diagnosis not present

## 2016-07-28 DIAGNOSIS — E039 Hypothyroidism, unspecified: Secondary | ICD-10-CM | POA: Insufficient documentation

## 2016-07-28 DIAGNOSIS — Z79899 Other long term (current) drug therapy: Secondary | ICD-10-CM | POA: Diagnosis not present

## 2016-07-28 DIAGNOSIS — Z85828 Personal history of other malignant neoplasm of skin: Secondary | ICD-10-CM | POA: Insufficient documentation

## 2016-07-28 DIAGNOSIS — Z8673 Personal history of transient ischemic attack (TIA), and cerebral infarction without residual deficits: Secondary | ICD-10-CM | POA: Diagnosis not present

## 2016-07-28 DIAGNOSIS — I10 Essential (primary) hypertension: Secondary | ICD-10-CM | POA: Diagnosis not present

## 2016-07-28 DIAGNOSIS — Z7982 Long term (current) use of aspirin: Secondary | ICD-10-CM | POA: Diagnosis not present

## 2016-07-28 DIAGNOSIS — R079 Chest pain, unspecified: Secondary | ICD-10-CM

## 2016-07-28 LAB — BASIC METABOLIC PANEL
Anion gap: 8 (ref 5–15)
BUN: 16 mg/dL (ref 6–20)
CHLORIDE: 103 mmol/L (ref 101–111)
CO2: 22 mmol/L (ref 22–32)
CREATININE: 0.64 mg/dL (ref 0.44–1.00)
Calcium: 9.2 mg/dL (ref 8.9–10.3)
GFR calc non Af Amer: >60 mL/min (ref >60)
Glucose, Bld: 117 mg/dL — ABNORMAL HIGH (ref 65–99)
POTASSIUM: 3.9 mmol/L (ref 3.5–5.1)
SODIUM: 133 mmol/L — AB (ref 135–145)

## 2016-07-28 LAB — LIPASE, BLOOD: Lipase: 44 U/L (ref 11–51)

## 2016-07-28 LAB — CBC
HEMATOCRIT: 36.5 % (ref 36.0–46.0)
HEMOGLOBIN: 11.2 g/dL — AB (ref 12.0–15.0)
MCH: 30 pg (ref 26.0–34.0)
MCHC: 30.7 g/dL (ref 30.0–36.0)
MCV: 97.9 fL (ref 78.0–100.0)
Platelets: 288 10*3/uL (ref 150–400)
RBC: 3.73 MIL/uL — ABNORMAL LOW (ref 3.87–5.11)
RDW: 15.8 % — AB (ref 11.5–15.5)
WBC: 5.9 10*3/uL (ref 4.0–10.5)

## 2016-07-28 LAB — I-STAT TROPONIN, ED: Troponin i, poc: 0.01 ng/mL (ref 0.00–0.08)

## 2016-07-28 LAB — HEPATIC FUNCTION PANEL
ALBUMIN: 3.9 g/dL (ref 3.5–5.0)
ALK PHOS: 70 U/L (ref 38–126)
ALT: 19 U/L (ref 14–54)
AST: 20 U/L (ref 15–41)
BILIRUBIN DIRECT: 0.1 mg/dL (ref 0.1–0.5)
BILIRUBIN TOTAL: 0.5 mg/dL (ref 0.3–1.2)
Indirect Bilirubin: 0.4 mg/dL (ref 0.3–0.9)
Total Protein: 6.3 g/dL — ABNORMAL LOW (ref 6.5–8.1)

## 2016-07-28 MED ORDER — BACLOFEN 10 MG PO TABS
10.0000 mg | ORAL_TABLET | Freq: Three times a day (TID) | ORAL | Status: DC
  Filled 2016-07-28: qty 1

## 2016-07-28 NOTE — ED Provider Notes (Signed)
[facility] DEPT Provider Note   CSN: [id number] Arrival date & time: 07/28/16  [id number]  First Provider Contact:  First MD Initiated Contact with Patient 07/28/16 0820        History   Chief Complaint Chief Complaint  Patient presents with  . Chest Pain    HPI Kendra Hernandez is a 75 y.o. female.  75 year old female with history of anxiety, arthritis, COPD, hyperlipidemia, hypertension presents for chest pain. The patient states that she awoke last night with pressure in her chest. She said she has continued to have pressure in her chest since that time. She does report some associated shortness of breath. She says that the pressure is at the bottom of her chest in the middle. Denies any radiation of the pain. Denies ever having pain like this before. Of note the patient was seen about 2 weeks ago following a fall. She has significant bruising to her face as well as to her neck and chest secondary to injury from the fall.       Past Medical History:  Diagnosis Date  . Anxiety   . Arthritis   . COPD (chronic obstructive pulmonary disease) ([place])   . Hyperlipidemia   . Hypertension   . Hypothyroid   . Insomnia   . Reflux   . Repeated falls   . Skin cancer   . Stress   . Stroke (cerebrum) [facility])     Patient Active Problem List   Diagnosis Date Noted  . Alcohol withdrawal ([place]) 07/17/2016  . Withdrawal symptoms, alcohol ([place]) 07/17/2016  . Hypothyroidism 07/17/2016  . History of CVA (cerebrovascular accident) 07/17/2016  . Head contusion 07/17/2016  . Fall 07/17/2016  . Insomnia 07/17/2016  . Essential hypertension 07/17/2016  . Abnormality of gait 01/24/2016  . Memory loss 01/24/2016  . Acute right MCA stroke ([place]) 01/09/2016  . Essential hypertension, benign 01/09/2016  . Hyperlipidemia LDL goal <100 01/09/2016  . Other specified hypothyroidism 01/09/2016  . Cervicalgia 01/09/2016  . Esophageal reflux 01/09/2016  . Major depression, chronic ([place]) 01/09/2016     Past Surgical History:  Procedure Laterality Date  . COLONOSCOPY     With polyp removal   . MANDIBLE FRACTURE SURGERY     Placement of partial jaw due to abcess  . SKIN CANCER EXCISION     Removal of several skin cancers  . TOE SURGERY Right    Removal of toe     OB History    No data available       Home Medications    Prior to Admission medications   Medication Sig Start Date End Date Taking? Authorizing Provider  acetaminophen (TYLENOL ARTHRITIS PAIN) 650 MG CR tablet Take 650 mg by mouth every 8 (eight) hours as needed for pain.   Yes Historical Provider, MD  aspirin EC 81 MG tablet Take 81 mg by mouth daily. With breakfast   Yes Historical Provider, MD  atorvastatin (LIPITOR) 10 MG tablet TK 1 T PO QD 06/30/16  Yes Historical Provider, MD  baclofen (LIORESAL) 10 MG tablet Take 10 mg by mouth 3 (three) times daily.   Yes Historical Provider, MD  folic acid (FOLVITE) 1 MG tablet Take 1 tablet (1 mg total) by mouth daily. 07/21/16  Yes Reyne Dumas, MD  hydrALAZINE (APRESOLINE) 50 MG tablet Take 50 mg by mouth 2 (two) times daily.   Yes Historical Provider, MD  labetalol (NORMODYNE) 200 MG tablet Take 200 mg by mouth 2 (two) times daily.   Yes Historical  Provider, MD  levothyroxine (SYNTHROID, LEVOTHROID) 50 MCG tablet Take 50 mcg by mouth daily before breakfast.   Yes Historical Provider, MD  loratadine (CLARITIN) 10 MG tablet Take 10 mg by mouth daily.   Yes Historical Provider, MD  omeprazole (PRILOSEC) 40 MG capsule Take 40 mg by mouth daily.   Yes Historical Provider, MD  potassium chloride (K-DUR) 10 MEQ tablet Take 2 tablets (20 mEq total) by mouth daily. 07/21/16  Yes Reyne Dumas, MD  sucralfate (CARAFATE) 1 g tablet Take 1 g by mouth 4 (four) times daily -  with meals and at bedtime.   Yes Historical Provider, MD  thiamine 100 MG tablet Take 1 tablet (100 mg total) by mouth daily. 07/21/16  Yes Reyne Dumas, MD  traZODone (DESYREL) 100 MG tablet Take 200 mg by mouth at  bedtime. 2 by mouth every evening   Yes Historical Provider, MD  venlafaxine (EFFEXOR) 100 MG tablet Take 100 mg by mouth 2 (two) times daily.   Yes Historical Provider, MD  nicotine (NICODERM CQ - DOSED IN MG/24 HOURS) 14 mg/24hr patch Place 1 patch (14 mg total) onto the skin daily. 07/21/16   Reyne Dumas, MD    Family History Family History  Problem Relation Age of Onset  . Emphysema Mother   . Rheumatologic disease Mother   . Heart attack Father   . Emphysema Brother   . Pancreatic cancer Daughter     Social History Social History  Substance Use Topics  . Smoking status: Current Every Day Smoker    Packs/day: 1.00    Years: 30.00    Types: Cigarettes  . Smokeless tobacco: Never Used  . Alcohol use 0.0 oz/week     Allergies   Ceftin [cefuroxime axetil]; Penicillins; and Simvastatin   Review of Systems Review of Systems  Constitutional: Negative for chills, fatigue and fever.  HENT: Negative for congestion, postnasal drip and rhinorrhea.   Eyes: Negative for visual disturbance.  Respiratory: Positive for shortness of breath. Negative for cough, chest tightness and wheezing.   Cardiovascular: Positive for chest pain. Negative for palpitations.  Gastrointestinal: Negative for abdominal pain, diarrhea, nausea and vomiting.  Genitourinary: Negative for dysuria, hematuria and urgency.  Musculoskeletal: Negative for back pain and myalgias.  Skin: Positive for color change (bruising over the anterior chest).  Neurological: Negative for dizziness, weakness, light-headedness and headaches.  Hematological: Does not bruise/bleed easily.     Physical Exam Updated Vital Signs BP 179/78   Pulse 77   Temp 97.9 F (36.6 C) (Oral)   Resp 25   SpO2 97%   Physical Exam  Constitutional: She is oriented to person, place, and time. She appears well-developed and well-nourished. No distress.  HENT:  Head: Normocephalic and atraumatic.  Right Ear: External ear normal.  Left  Ear: External ear normal.  Nose: Nose normal.  Mouth/Throat: Oropharynx is clear and moist. No oropharyngeal exudate.  Eyes: EOM are normal. Pupils are equal, round, and reactive to light.  Neck: Normal range of motion. Neck supple.  Cardiovascular: Normal rate, regular rhythm and intact distal pulses.   Murmur heard. Pulmonary/Chest: Effort normal. No respiratory distress. She has no wheezes. She has no rales. She exhibits tenderness (mild over the areas of significant bruising over the anterior chest wall). She exhibits no crepitus, no edema, no swelling and no retraction.  Abdominal: Soft. She exhibits no distension. There is tenderness (mild) in the epigastric area.  Musculoskeletal: Normal range of motion. She exhibits no edema or tenderness.  Neurological: She is alert and oriented to person, place, and time.  Skin: Skin is warm and dry. No rash noted. She is not diaphoretic.     Vitals reviewed.    ED Treatments / Results  Labs (all labs ordered are listed, but only abnormal results are displayed) Labs Reviewed  BASIC METABOLIC PANEL - Abnormal; Notable for the following:       Result Value   Sodium 133 (*)    Glucose, Bld 117 (*)    All other components within normal limits  CBC - Abnormal; Notable for the following:    RBC 3.73 (*)    Hemoglobin 11.2 (*)    RDW 15.8 (*)    All other components within normal limits  HEPATIC FUNCTION PANEL - Abnormal; Notable for the following:    Total Protein 6.3 (*)    All other components within normal limits  LIPASE, BLOOD  I-STAT TROPOININ, ED    EKG  EKG Interpretation  Date/Time:  Sunday July 28 2016 07:58:07 EDT Ventricular Rate:  68 PR Interval:    QRS Duration: 111 QT Interval:  393 QTC Calculation: 418 R Axis:   -72 Text Interpretation:  Sinus rhythm Incomplete RBBB and LAFB Probable left ventricular hypertrophy Anterior Q waves, possibly due to LVH Baseline wander in lead(s) I III aVL No significant change since  last tracing Confirmed by Lonia Skinner ([id number]) on 07/28/2016 12:36:11 PM       Radiology Dg Chest 2 View  Result Date: 07/28/2016 CLINICAL DATA:  Chest pain, bruising.  [place] a few weeks ago. EXAM: CHEST  2 VIEW COMPARISON:  11/15/2010 FINDINGS: Mild cardiomegaly. Moderate-sized hiatal hernia. No confluent airspace opacities or effusions. No visible rib fracture. No pneumothorax. IMPRESSION: Cardiomegaly.  Hiatal hernia.  No active disease. Electronically Signed   By: Rolm Baptise M.D.   On: 07/28/2016 09:01   Procedures Procedures (including critical care time)  Medications Ordered in ED Medications  baclofen (LIORESAL) tablet 10 mg (not administered)     Initial Impression / Assessment and Plan / ED Course  I have reviewed the triage vital signs and the nursing notes.  Pertinent labs & imaging results that were available during my care of the patient were reviewed by me and considered in my medical decision making (see chart for details).  Clinical Course    Patient was seen and evaluated in stable condition.  Patient with reproducible pain on examination but significant bruising over the chest wall.  EKG without acute change and initial troponin normal.  Patient not tachycardic or hypertensive but did have tachypnea.  I ordered CTA for evaluation of chest wall and for possible PE and recommended the patient stay for at least 2 troponins or possibly a chest pain rule out admission but the patient suddenly stated that she needed to leave and refused to stay.  I expressed my clinical concerns and asked that the patient stay for further work up.  She expressed understanding of my concerns but continued to refuse further work up and refused to stay and signed out AMA while I was in the room of another patient.  Final Clinical Impressions(s) / ED Diagnoses   Final diagnoses:  Chest pain, unspecified chest pain type    New Prescriptions Discharge Medication List as of 07/28/2016 11:57  AM       Harvel Quale, MD 07/28/16 1242

## 2016-07-28 NOTE — ED Triage Notes (Signed)
Pt to ER from home BIB [facility] with complaint of central chest tightness and shortness of breath onset at midnight. No known cardiac hx per patient. Hx of COPD. Given 324 mg aspirin in route along with 2 nitro and reports pain is down from a 6/10 to 3/10. EMS reports lung sounds clear, no swelling noted, no nausea, vomiting, or diaphoresis. Pt a/o x4 on arrival. [place] x3 weeks ago presents with significant bruising to chest and hematoma to left outer orbit. VS - 138/70, HR 80, 99% on RA although patient using accessory muscles with breathing.

## 2016-07-28 NOTE — ED Notes (Signed)
Pt insisting on leaving reports she "was scanned three weeks ago and everything was fine so I don't understand why I have to do another scan. I have something to tend to at home and need to leave." MD Alfonse Spruce has discussed with patient the risks of leaving and that she will be leaving against medical advice. Pt agrees. Pt has signed AMA form. VSS. IV removed. Pt ambulatory. Wheeled to waiting room to await transportation.

## 2016-07-28 NOTE — ED Notes (Signed)
Pt states she is ready to leave, wishes for [place] to call [facility].

## 2016-08-01 DIAGNOSIS — S0003XD Contusion of scalp, subsequent encounter: Secondary | ICD-10-CM | POA: Diagnosis not present

## 2016-08-01 NOTE — ED Provider Notes (Signed)
[facility] DEPT Provider Note   CSN: [id number] Arrival date & time: 07/15/16  1653  First Provider Contact:  First MD Initiated Contact with Patient 07/15/16 1717        History   Chief Complaint Chief Complaint  Patient presents with  . Fall    NO LOC  . Alcohol Intoxication  . Abrasion    HPI Kendra Hernandez is a 75 y.o. female.  HPI   75 year old female presenting after a fall. Patient states she lost her balance and fell backwards. She is having pain in her back and to lesser extent to her neck. Does have some evidence of mild head trauma she denies any significant headache. No blood thinners. No acute visual complaints. No nausea or vomiting. She does admit to drinking beer earlier today.  Past Medical History:  Diagnosis Date  . Anxiety   . Arthritis   . COPD (chronic obstructive pulmonary disease) ([place])   . Hyperlipidemia   . Hypertension   . Hypothyroid   . Insomnia   . Reflux   . Repeated falls   . Skin cancer   . Stress   . Stroke (cerebrum) [facility])     Patient Active Problem List   Diagnosis Date Noted  . Alcohol withdrawal ([place]) 07/17/2016  . Withdrawal symptoms, alcohol ([place]) 07/17/2016  . Hypothyroidism 07/17/2016  . History of CVA (cerebrovascular accident) 07/17/2016  . Head contusion 07/17/2016  . Fall 07/17/2016  . Insomnia 07/17/2016  . Essential hypertension 07/17/2016  . Abnormality of gait 01/24/2016  . Memory loss 01/24/2016  . Acute right MCA stroke ([place]) 01/09/2016  . Essential hypertension, benign 01/09/2016  . Hyperlipidemia LDL goal <100 01/09/2016  . Other specified hypothyroidism 01/09/2016  . Cervicalgia 01/09/2016  . Esophageal reflux 01/09/2016  . Major depression, chronic ([place]) 01/09/2016    Past Surgical History:  Procedure Laterality Date  . COLONOSCOPY     With polyp removal   . MANDIBLE FRACTURE SURGERY     Placement of partial jaw due to abcess  . SKIN CANCER EXCISION     Removal of several skin cancers    . TOE SURGERY Right    Removal of toe     OB History    No data available       Home Medications    Prior to Admission medications   Medication Sig Start Date End Date Taking? Authorizing Provider  acetaminophen (TYLENOL ARTHRITIS PAIN) 650 MG CR tablet Take 650 mg by mouth every 8 (eight) hours as needed for pain.   Yes Historical Provider, MD  aspirin EC 81 MG tablet Take 81 mg by mouth daily. With breakfast   Yes Historical Provider, MD  atorvastatin (LIPITOR) 10 MG tablet TK 1 T PO QD 06/30/16  Yes Historical Provider, MD  baclofen (LIORESAL) 10 MG tablet Take 10 mg by mouth 3 (three) times daily.   Yes Historical Provider, MD  hydrALAZINE (APRESOLINE) 50 MG tablet Take 50 mg by mouth 2 (two) times daily.   Yes Historical Provider, MD  labetalol (NORMODYNE) 200 MG tablet Take 200 mg by mouth 2 (two) times daily.   Yes Historical Provider, MD  levothyroxine (SYNTHROID, LEVOTHROID) 50 MCG tablet Take 50 mcg by mouth daily before breakfast.   Yes Historical Provider, MD  loratadine (CLARITIN) 10 MG tablet Take 10 mg by mouth daily.   Yes Historical Provider, MD  omeprazole (PRILOSEC) 40 MG capsule Take 40 mg by mouth daily.   Yes Historical Provider, MD  sucralfate ([place])  1 g tablet Take 1 g by mouth 4 (four) times daily -  with meals and at bedtime.   Yes Historical Provider, MD  traZODone (DESYREL) 100 MG tablet Take 200 mg by mouth at bedtime. 2 by mouth every evening   Yes Historical Provider, MD  venlafaxine (EFFEXOR) 100 MG tablet Take 100 mg by mouth 2 (two) times daily.   Yes Historical Provider, MD  folic acid (FOLVITE) 1 MG tablet Take 1 tablet (1 mg total) by mouth daily. 07/21/16   Reyne Dumas, MD  nicotine (NICODERM CQ - DOSED IN MG/24 HOURS) 14 mg/24hr patch Place 1 patch (14 mg total) onto the skin daily. 07/21/16   Reyne Dumas, MD  potassium chloride (K-DUR) 10 MEQ tablet Take 2 tablets (20 mEq total) by mouth daily. 07/21/16   Reyne Dumas, MD  thiamine 100 MG tablet  Take 1 tablet (100 mg total) by mouth daily. 07/21/16   Reyne Dumas, MD    Family History Family History  Problem Relation Age of Onset  . Emphysema Mother   . Rheumatologic disease Mother   . Heart attack Father   . Emphysema Brother   . Pancreatic cancer Daughter     Social History Social History  Substance Use Topics  . Smoking status: Current Every Day Smoker    Packs/day: 1.00    Years: 30.00    Types: Cigarettes  . Smokeless tobacco: Never Used  . Alcohol use 0.0 oz/week     Allergies   Ceftin [cefuroxime axetil]; Penicillins; and Simvastatin   Review of Systems Review of Systems All systems reviewed and negative, other than as noted in HPI.   Physical Exam Updated Vital Signs BP 157/69 (BP Location: Left Arm)   Pulse 80   Temp 98.4 F (36.9 C) (Oral)   Resp 20   SpO2 94%   Physical Exam  Constitutional:  Frail and chronically ill appearing. No acute distress.  HENT:  Head: Normocephalic and atraumatic.  Eyes: Conjunctivae are normal.  Neck: Neck supple.  Cardiovascular: Normal rate and regular rhythm.   No murmur heard. Pulmonary/Chest: Effort normal and breath sounds normal. No respiratory distress.  Abdominal: Soft. There is no tenderness.  Musculoskeletal: She exhibits no edema.  Neurological: She is alert.  Skin: Skin is warm and dry.  Psychiatric: She has a normal mood and affect.  Nursing note and vitals reviewed.    ED Treatments / Results  Labs (all labs ordered are listed, but only abnormal results are displayed) Labs Reviewed  CBC WITH DIFFERENTIAL/PLATELET - Abnormal; Notable for the following:       Result Value   RDW 15.8 (*)    All other components within normal limits  COMPREHENSIVE METABOLIC PANEL - Abnormal; Notable for the following:    ALT 13 (*)    All other components within normal limits  ETHANOL - Abnormal; Notable for the following:    Alcohol, Ethyl (B) 172 (*)    All other components within normal limits    URINALYSIS, ROUTINE W REFLEX MICROSCOPIC (NOT AT [facility])    EKG  EKG Interpretation  Date/Time:  Monday July 15 2016 18:04:44 EDT Ventricular Rate:  72 PR Interval:    QRS Duration: 113 QT Interval:  418 QTC Calculation: 458 R Axis:   75 Text Interpretation:  Sinus rhythm Anteroseptal infarct, old Baseline wander in lead(s) I aVL No old tracing to compare Confirmed by Wilson Singer  MD, Shandel Busic ([id number]) on 07/15/2016 7:37:15 PM Also confirmed by Wilson Singer  MD, Asyia Hornung (  60), editor [place]  CCT, BEVERLY ([id number])  on 07/16/2016 6:53:22 AM       Radiology No results found.   Dg Chest 2 View  Result Date: 07/28/2016 CLINICAL DATA:  Chest pain, bruising.  [place] a few weeks ago. EXAM: CHEST  2 VIEW COMPARISON:  11/15/2010 FINDINGS: Mild cardiomegaly. Moderate-sized hiatal hernia. No confluent airspace opacities or effusions. No visible rib fracture. No pneumothorax. IMPRESSION: Cardiomegaly.  Hiatal hernia.  No active disease. Electronically Signed   By: Rolm Baptise M.D.   On: 07/28/2016 09:01  Dg Shoulder Right  Result Date: 07/16/2016 CLINICAL DATA:  Pain following fall EXAM: RIGHT SHOULDER - 2+ VIEW COMPARISON:  None. FINDINGS: Frontal and Y scapular views were obtained. There is no acute fracture or dislocation. There is osteoarthritic change in the glenohumeral joint and to a lesser extent in the acromioclavicular joint. No erosive change or intra-articular calcification. Visualized right is clear. IMPRESSION: Areas of osteoarthritic change.  No fracture or dislocation. Electronically Signed   By: Lowella Grip III M.D.   On: 07/16/2016 17:56   Ct Head Wo Contrast  Result Date: 07/16/2016 CLINICAL DATA:  Pain following fall after episode of dizziness. EXAM: CT HEAD WITHOUT CONTRAST CT MAXILLOFACIAL WITHOUT CONTRAST CT CERVICAL SPINE WITHOUT CONTRAST TECHNIQUE: Multidetector CT imaging of the head, cervical spine, and maxillofacial structures were performed using the standard protocol without  intravenous contrast. Multiplanar CT image reconstructions of the cervical spine and maxillofacial structures were also generated. COMPARISON:  Head CT in knee July 15, 2016 FINDINGS: CT HEAD FINDINGS Mild diffuse atrophy is stable. There is no intracranial mass, hemorrhage, extra-axial fluid collection, or midline shift. There is evidence of an old infarct involving much of the right temporal lobe as well as portions of posterior right lentiform nucleus and portions of the right internal capsule. Prior infarct also involves a portion of the lateral aspect of the anterior right occipital lobe. No acute infarct is evident. There is calcification in the periphery of the M2 segment of the right middle cerebral artery, likely a chronic thrombus. There are foci of calcification in each distal vertebral artery as well as in the carotid siphon regions bilaterally. No hyperdense vessel is currently evident. The bony calvarium appears intact. There is a large hematoma overlying anterior frontal bone and superior and lateral to the orbit on the left. This hematoma measures 9.6 x 5.6 x 4.0 cm. There is extensive preseptal soft tissue edema over the left orbit as well. Mastoid air cells are clear except for a few opacified mastoid air cells on the left inferiorly. CT MAXILLOFACIAL FINDINGS There is evidence of prior surgical fixation in the mandible on both right and left sides. There is extensive soft tissue edema over the preseptal left orbit as well as over the left frontal bone anteriorly as noted in the head CT report above. There is no acute fracture or dislocation on the current examination. There is mucosal thickening in several ethmoid air cells bilaterally. Other paranasal sinuses are clear. There is no air-fluid level. No bony destruction or expansion. Ostiomeatal unit complexes are patent bilaterally. There is mild leftward deviation of the nasal septum. There is no nares obstruction on either side. There is no  turbinate edema evident. Although not fractured, there is anterior subluxation of the right mandibular condyle with questionable dislocation of the right mandibular condyle. The left mandibular condyle is positioned within the temporal eminence. No other dislocation or subluxation evident. No adenopathy is appreciable. Visualized salivary glands appear symmetric  bilaterally. There are multiple foci of carotid artery calcification bilaterally. CT CERVICAL SPINE FINDINGS There is no demonstrable fracture or spondylolisthesis. There is cervical levoscoliosis. Prevertebral soft tissues and predental space regions are normal. There is moderate disc space narrowing C5-6 and C6-7. There is milder disc space narrowing at C4-5. There is multilevel facet hypertrophy. Exit foraminal narrowing due to bony hypertrophy is noted at multiple levels. No high-grade stenosis evident. There are multiple foci of carotid artery calcification bilaterally. IMPRESSION: There are multiple foci of carotid artery calcification. CT head: Extensive soft tissue hematoma over the left frontal bone and preseptal left orbit. No calvarial fracture. Prior infarcts involving much of the right temporal lobe as well as portions of the right lentiform nucleus and right internal capsule. Infarct also extends into the anterior right occipital lobe. These infarcts are stable. No acute infarct is evident. No intracranial mass, hemorrhage, or extra-axial fluid collection. Calcification in the M2 segment of the right middle cerebral artery likely represents prior thrombus. Several inferior mastoid air cells on the left are opacified. Most the mastoids bilaterally are clear. CT maxillofacial: Old trauma involving the mandible with surgical fixation. Subluxation with possible dislocation of the right mandibular condyles with respect the right temporal eminence. Extensive soft tissue swelling over the preseptal left orbit left frontal bone anteriorly without  fracture in this region. No intraorbital lesions are evident. There is mild mucosal thickening in several ethmoid air cells. Other paranasal sinuses are clear. No air-fluid level. No bony destruction or expansion. Ostiomeatal unit complexes patent bilaterally. Mild leftward deviation nasal septum. CT cervical spine: No fracture or spondylolisthesis. Multilevel arthropathy. Foci of carotid artery calcification noted. Electronically Signed   By: Lowella Grip III M.D.   On: 07/16/2016 19:09   Ct Head Wo Contrast  Result Date: 07/15/2016 CLINICAL DATA:  Dizziness leading to fall. No loss of consciousness. Abrasion to posterior head. EXAM: CT HEAD WITHOUT CONTRAST TECHNIQUE: Contiguous axial images were obtained from the base of the skull through the vertex without intravenous contrast. COMPARISON:  None. FINDINGS: Brain: No intracranial hemorrhage, mass effect, or midline shift. Remote lacunar infarcts in the right basal ganglia and encephalomalacia in the right temporal occipital lobe likely a sequela of prior MCA distribution infarct. No hydrocephalus. The basilar cisterns are patent. No evidence of acute ischemia. No intracranial fluid collection. Vascular: No hyperdense vessel. Calcification but the distal right M1 branch may be sequela of prior thrombus. There is atherosclerosis of skullbase vasculature. Skull: Small right posterior scalp edema/hematoma. Calvarium is intact. Sinuses/Orbits: Included paranasal sinuses and mastoid air cells are well aerated. Other: None. IMPRESSION: 1.  No acute intracranial abnormality. 2. Remote lacunar infarcts in the right basal ganglia and sequela of remote MCA distribution infarct. Electronically Signed   By: Jeb Levering M.D.   On: 07/15/2016 20:18   Ct Cervical Spine Wo Contrast  Result Date: 07/16/2016 CLINICAL DATA:  Pain following fall after episode of dizziness. EXAM: CT HEAD WITHOUT CONTRAST CT MAXILLOFACIAL WITHOUT CONTRAST CT CERVICAL SPINE WITHOUT  CONTRAST TECHNIQUE: Multidetector CT imaging of the head, cervical spine, and maxillofacial structures were performed using the standard protocol without intravenous contrast. Multiplanar CT image reconstructions of the cervical spine and maxillofacial structures were also generated. COMPARISON:  Head CT in knee July 15, 2016 FINDINGS: CT HEAD FINDINGS Mild diffuse atrophy is stable. There is no intracranial mass, hemorrhage, extra-axial fluid collection, or midline shift. There is evidence of an old infarct involving much of the right temporal lobe as well as  portions of posterior right lentiform nucleus and portions of the right internal capsule. Prior infarct also involves a portion of the lateral aspect of the anterior right occipital lobe. No acute infarct is evident. There is calcification in the periphery of the M2 segment of the right middle cerebral artery, likely a chronic thrombus. There are foci of calcification in each distal vertebral artery as well as in the carotid siphon regions bilaterally. No hyperdense vessel is currently evident. The bony calvarium appears intact. There is a large hematoma overlying anterior frontal bone and superior and lateral to the orbit on the left. This hematoma measures 9.6 x 5.6 x 4.0 cm. There is extensive preseptal soft tissue edema over the left orbit as well. Mastoid air cells are clear except for a few opacified mastoid air cells on the left inferiorly. CT MAXILLOFACIAL FINDINGS There is evidence of prior surgical fixation in the mandible on both right and left sides. There is extensive soft tissue edema over the preseptal left orbit as well as over the left frontal bone anteriorly as noted in the head CT report above. There is no acute fracture or dislocation on the current examination. There is mucosal thickening in several ethmoid air cells bilaterally. Other paranasal sinuses are clear. There is no air-fluid level. No bony destruction or expansion. Ostiomeatal  unit complexes are patent bilaterally. There is mild leftward deviation of the nasal septum. There is no nares obstruction on either side. There is no turbinate edema evident. Although not fractured, there is anterior subluxation of the right mandibular condyle with questionable dislocation of the right mandibular condyle. The left mandibular condyle is positioned within the temporal eminence. No other dislocation or subluxation evident. No adenopathy is appreciable. Visualized salivary glands appear symmetric bilaterally. There are multiple foci of carotid artery calcification bilaterally. CT CERVICAL SPINE FINDINGS There is no demonstrable fracture or spondylolisthesis. There is cervical levoscoliosis. Prevertebral soft tissues and predental space regions are normal. There is moderate disc space narrowing C5-6 and C6-7. There is milder disc space narrowing at C4-5. There is multilevel facet hypertrophy. Exit foraminal narrowing due to bony hypertrophy is noted at multiple levels. No high-grade stenosis evident. There are multiple foci of carotid artery calcification bilaterally. IMPRESSION: There are multiple foci of carotid artery calcification. CT head: Extensive soft tissue hematoma over the left frontal bone and preseptal left orbit. No calvarial fracture. Prior infarcts involving much of the right temporal lobe as well as portions of the right lentiform nucleus and right internal capsule. Infarct also extends into the anterior right occipital lobe. These infarcts are stable. No acute infarct is evident. No intracranial mass, hemorrhage, or extra-axial fluid collection. Calcification in the M2 segment of the right middle cerebral artery likely represents prior thrombus. Several inferior mastoid air cells on the left are opacified. Most the mastoids bilaterally are clear. CT maxillofacial: Old trauma involving the mandible with surgical fixation. Subluxation with possible dislocation of the right mandibular  condyles with respect the right temporal eminence. Extensive soft tissue swelling over the preseptal left orbit left frontal bone anteriorly without fracture in this region. No intraorbital lesions are evident. There is mild mucosal thickening in several ethmoid air cells. Other paranasal sinuses are clear. No air-fluid level. No bony destruction or expansion. Ostiomeatal unit complexes patent bilaterally. Mild leftward deviation nasal septum. CT cervical spine: No fracture or spondylolisthesis. Multilevel arthropathy. Foci of carotid artery calcification noted. Electronically Signed   By: Lowella Grip III M.D.   On: 07/16/2016 19:09  Ct Maxillofacial Wo Contrast  Result Date: 07/16/2016 CLINICAL DATA:  Pain following fall after episode of dizziness. EXAM: CT HEAD WITHOUT CONTRAST CT MAXILLOFACIAL WITHOUT CONTRAST CT CERVICAL SPINE WITHOUT CONTRAST TECHNIQUE: Multidetector CT imaging of the head, cervical spine, and maxillofacial structures were performed using the standard protocol without intravenous contrast. Multiplanar CT image reconstructions of the cervical spine and maxillofacial structures were also generated. COMPARISON:  Head CT in knee July 15, 2016 FINDINGS: CT HEAD FINDINGS Mild diffuse atrophy is stable. There is no intracranial mass, hemorrhage, extra-axial fluid collection, or midline shift. There is evidence of an old infarct involving much of the right temporal lobe as well as portions of posterior right lentiform nucleus and portions of the right internal capsule. Prior infarct also involves a portion of the lateral aspect of the anterior right occipital lobe. No acute infarct is evident. There is calcification in the periphery of the M2 segment of the right middle cerebral artery, likely a chronic thrombus. There are foci of calcification in each distal vertebral artery as well as in the carotid siphon regions bilaterally. No hyperdense vessel is currently evident. The bony calvarium  appears intact. There is a large hematoma overlying anterior frontal bone and superior and lateral to the orbit on the left. This hematoma measures 9.6 x 5.6 x 4.0 cm. There is extensive preseptal soft tissue edema over the left orbit as well. Mastoid air cells are clear except for a few opacified mastoid air cells on the left inferiorly. CT MAXILLOFACIAL FINDINGS There is evidence of prior surgical fixation in the mandible on both right and left sides. There is extensive soft tissue edema over the preseptal left orbit as well as over the left frontal bone anteriorly as noted in the head CT report above. There is no acute fracture or dislocation on the current examination. There is mucosal thickening in several ethmoid air cells bilaterally. Other paranasal sinuses are clear. There is no air-fluid level. No bony destruction or expansion. Ostiomeatal unit complexes are patent bilaterally. There is mild leftward deviation of the nasal septum. There is no nares obstruction on either side. There is no turbinate edema evident. Although not fractured, there is anterior subluxation of the right mandibular condyle with questionable dislocation of the right mandibular condyle. The left mandibular condyle is positioned within the temporal eminence. No other dislocation or subluxation evident. No adenopathy is appreciable. Visualized salivary glands appear symmetric bilaterally. There are multiple foci of carotid artery calcification bilaterally. CT CERVICAL SPINE FINDINGS There is no demonstrable fracture or spondylolisthesis. There is cervical levoscoliosis. Prevertebral soft tissues and predental space regions are normal. There is moderate disc space narrowing C5-6 and C6-7. There is milder disc space narrowing at C4-5. There is multilevel facet hypertrophy. Exit foraminal narrowing due to bony hypertrophy is noted at multiple levels. No high-grade stenosis evident. There are multiple foci of carotid artery calcification  bilaterally. IMPRESSION: There are multiple foci of carotid artery calcification. CT head: Extensive soft tissue hematoma over the left frontal bone and preseptal left orbit. No calvarial fracture. Prior infarcts involving much of the right temporal lobe as well as portions of the right lentiform nucleus and right internal capsule. Infarct also extends into the anterior right occipital lobe. These infarcts are stable. No acute infarct is evident. No intracranial mass, hemorrhage, or extra-axial fluid collection. Calcification in the M2 segment of the right middle cerebral artery likely represents prior thrombus. Several inferior mastoid air cells on the left are opacified. Most the mastoids bilaterally  are clear. CT maxillofacial: Old trauma involving the mandible with surgical fixation. Subluxation with possible dislocation of the right mandibular condyles with respect the right temporal eminence. Extensive soft tissue swelling over the preseptal left orbit left frontal bone anteriorly without fracture in this region. No intraorbital lesions are evident. There is mild mucosal thickening in several ethmoid air cells. Other paranasal sinuses are clear. No air-fluid level. No bony destruction or expansion. Ostiomeatal unit complexes patent bilaterally. Mild leftward deviation nasal septum. CT cervical spine: No fracture or spondylolisthesis. Multilevel arthropathy. Foci of carotid artery calcification noted. Electronically Signed   By: Lowella Grip III M.D.   On: 07/16/2016 19:09    Procedures Procedures (including critical care time)  Medications Ordered in ED Medications  sodium chloride 0.9 % bolus 1,000 mL (0 mLs Intravenous Stopped 07/15/16 2017)     Initial Impression / Assessment and Plan / ED Course  I have reviewed the triage vital signs and the nursing notes.  Pertinent labs & imaging results that were available during my care of the patient were reviewed by me and considered in my medical  decision making (see chart for details).  Clinical Course      Final Clinical Impressions(s) / ED Diagnoses   Final diagnoses:  Alcohol intoxication, uncomplicated ([place])  Fall, initial encounter    New Prescriptions Discharge Medication List as of 07/15/2016  8:40 PM       Virgel Manifold, MD 08/01/16 [id number]

## 2016-09-24 DIAGNOSIS — F17210 Nicotine dependence, cigarettes, uncomplicated: Secondary | ICD-10-CM | POA: Diagnosis not present

## 2016-09-24 DIAGNOSIS — Z23 Encounter for immunization: Secondary | ICD-10-CM | POA: Diagnosis not present

## 2016-09-24 DIAGNOSIS — I10 Essential (primary) hypertension: Secondary | ICD-10-CM | POA: Diagnosis not present

## 2016-09-24 DIAGNOSIS — R443 Hallucinations, unspecified: Secondary | ICD-10-CM | POA: Diagnosis not present

## 2016-10-23 ENCOUNTER — Telehealth: Payer: Self-pay | Admitting: Nurse Practitioner

## 2016-10-23 NOTE — Telephone Encounter (Signed)
Pt's son called to r/s her appt. Advised he'd had surgery and is not well enough to bring to appt. Dr Krista Blue is booked into January 2018. An appt was made with Hoyle Sauer on 11/7.  Pt has not seen NP before.  [email or web address]

## 2016-10-24 ENCOUNTER — Ambulatory Visit: Payer: Medicare Other | Admitting: Neurology

## 2016-10-25 ENCOUNTER — Encounter (HOSPITAL_COMMUNITY): Payer: Self-pay | Admitting: *Deleted

## 2016-10-25 ENCOUNTER — Emergency Department (HOSPITAL_COMMUNITY): Payer: Medicare Other

## 2016-10-25 ENCOUNTER — Emergency Department (HOSPITAL_COMMUNITY)
Admission: EM | Admit: 2016-10-25 | Discharge: 2016-10-25 | Disposition: A | Discharge status: Home / Self Care | Payer: Medicare Other | Attending: Emergency Medicine | Admitting: Emergency Medicine

## 2016-10-25 DIAGNOSIS — R1111 Vomiting without nausea: Secondary | ICD-10-CM | POA: Diagnosis not present

## 2016-10-25 DIAGNOSIS — R1084 Generalized abdominal pain: Secondary | ICD-10-CM | POA: Diagnosis not present

## 2016-10-25 DIAGNOSIS — Z7982 Long term (current) use of aspirin: Secondary | ICD-10-CM | POA: Insufficient documentation

## 2016-10-25 DIAGNOSIS — Z79899 Other long term (current) drug therapy: Secondary | ICD-10-CM | POA: Insufficient documentation

## 2016-10-25 DIAGNOSIS — E876 Hypokalemia: Secondary | ICD-10-CM | POA: Insufficient documentation

## 2016-10-25 DIAGNOSIS — R109 Unspecified abdominal pain: Secondary | ICD-10-CM | POA: Diagnosis not present

## 2016-10-25 DIAGNOSIS — R112 Nausea with vomiting, unspecified: Secondary | ICD-10-CM | POA: Insufficient documentation

## 2016-10-25 DIAGNOSIS — R1110 Vomiting, unspecified: Secondary | ICD-10-CM

## 2016-10-25 DIAGNOSIS — J449 Chronic obstructive pulmonary disease, unspecified: Secondary | ICD-10-CM | POA: Insufficient documentation

## 2016-10-25 DIAGNOSIS — R197 Diarrhea, unspecified: Secondary | ICD-10-CM | POA: Insufficient documentation

## 2016-10-25 DIAGNOSIS — I10 Essential (primary) hypertension: Secondary | ICD-10-CM | POA: Insufficient documentation

## 2016-10-25 DIAGNOSIS — K2970 Gastritis, unspecified, without bleeding: Secondary | ICD-10-CM | POA: Diagnosis not present

## 2016-10-25 DIAGNOSIS — E039 Hypothyroidism, unspecified: Secondary | ICD-10-CM | POA: Diagnosis not present

## 2016-10-25 DIAGNOSIS — F17210 Nicotine dependence, cigarettes, uncomplicated: Secondary | ICD-10-CM | POA: Insufficient documentation

## 2016-10-25 DIAGNOSIS — R1011 Right upper quadrant pain: Secondary | ICD-10-CM | POA: Diagnosis present

## 2016-10-25 LAB — URINALYSIS, ROUTINE W REFLEX MICROSCOPIC
Bilirubin Urine: NEGATIVE
GLUCOSE, UA: NEGATIVE mg/dL
HGB URINE DIPSTICK: NEGATIVE
KETONES UR: NEGATIVE mg/dL
Leukocytes, UA: NEGATIVE
Nitrite: NEGATIVE
PROTEIN: NEGATIVE mg/dL
Specific Gravity, Urine: >1.046 — ABNORMAL HIGH (ref 1.005–1.030)
pH: 5.5 (ref 5.0–8.0)

## 2016-10-25 LAB — POC OCCULT BLOOD, ED: FECAL OCCULT BLD: NEGATIVE

## 2016-10-25 LAB — COMPREHENSIVE METABOLIC PANEL
ALBUMIN: 4.1 g/dL (ref 3.5–5.0)
ALT: 15 U/L (ref 14–54)
ANION GAP: 10 (ref 5–15)
AST: 17 U/L (ref 15–41)
Alkaline Phosphatase: 51 U/L (ref 38–126)
BILIRUBIN TOTAL: 0.5 mg/dL (ref 0.3–1.2)
BUN: 15 mg/dL (ref 6–20)
CO2: 23 mmol/L (ref 22–32)
Calcium: 9.3 mg/dL (ref 8.9–10.3)
Chloride: 104 mmol/L (ref 101–111)
Creatinine, Ser: 0.6 mg/dL (ref 0.44–1.00)
GFR calc Af Amer: >60 mL/min (ref >60)
GFR calc non Af Amer: >60 mL/min (ref >60)
GLUCOSE: 127 mg/dL — AB (ref 65–99)
POTASSIUM: 2.8 mmol/L — AB (ref 3.5–5.1)
Sodium: 137 mmol/L (ref 135–145)
TOTAL PROTEIN: 6.9 g/dL (ref 6.5–8.1)

## 2016-10-25 LAB — CBC WITH DIFFERENTIAL/PLATELET
BASOS PCT: 0 %
Basophils Absolute: 0 10*3/uL (ref 0.0–0.1)
EOS ABS: 0 10*3/uL (ref 0.0–0.7)
EOS PCT: 0 %
HEMATOCRIT: 39.6 % (ref 36.0–46.0)
Hemoglobin: 13.5 g/dL (ref 12.0–15.0)
Lymphocytes Relative: 24 %
Lymphs Abs: 1.5 10*3/uL (ref 0.7–4.0)
MCH: 30.8 pg (ref 26.0–34.0)
MCHC: 34.1 g/dL (ref 30.0–36.0)
MCV: 90.4 fL (ref 78.0–100.0)
MONO ABS: 0.5 10*3/uL (ref 0.1–1.0)
MONOS PCT: 7 %
NEUTROS ABS: 4.4 10*3/uL (ref 1.7–7.7)
Neutrophils Relative %: 69 %
PLATELETS: 245 10*3/uL (ref 150–400)
RBC: 4.38 MIL/uL (ref 3.87–5.11)
RDW: 15.8 % — AB (ref 11.5–15.5)
WBC: 6.3 10*3/uL (ref 4.0–10.5)

## 2016-10-25 LAB — LIPASE, BLOOD: LIPASE: 21 U/L (ref 11–51)

## 2016-10-25 LAB — I-STAT TROPONIN, ED: TROPONIN I, POC: 0 ng/mL (ref 0.00–0.08)

## 2016-10-25 MED ORDER — ONDANSETRON HCL 4 MG/2ML IJ SOLN
4.0000 mg | Freq: Once | INTRAMUSCULAR | Status: AC
  Administered 2016-10-25: 4 mg via INTRAVENOUS
  Filled 2016-10-25: qty 2

## 2016-10-25 MED ORDER — SODIUM CHLORIDE 0.9 % IV BOLUS (SEPSIS)
500.0000 mL | Freq: Once | INTRAVENOUS | Status: AC
  Administered 2016-10-25: 500 mL via INTRAVENOUS

## 2016-10-25 MED ORDER — FENTANYL CITRATE (PF) 100 MCG/2ML IJ SOLN
50.0000 ug | Freq: Once | INTRAMUSCULAR | Status: AC
  Administered 2016-10-25: 50 ug via INTRAVENOUS
  Filled 2016-10-25: qty 2

## 2016-10-25 MED ORDER — IOPAMIDOL (ISOVUE-300) INJECTION 61%
100.0000 mL | Freq: Once | INTRAVENOUS | Status: AC | PRN
  Administered 2016-10-25: 100 mL via INTRAVENOUS

## 2016-10-25 MED ORDER — POTASSIUM CHLORIDE CRYS ER 20 MEQ PO TBCR
40.0000 meq | EXTENDED_RELEASE_TABLET | Freq: Once | ORAL | Status: AC
  Administered 2016-10-25: 40 meq via ORAL
  Filled 2016-10-25: qty 2

## 2016-10-25 NOTE — ED Notes (Signed)
Bed: WA11 Expected date:  Expected time:  Means of arrival:  Comments: EMS abdominal pain 

## 2016-10-25 NOTE — ED Notes (Signed)
EKG given to EDP,Wickline,MD., for review. 

## 2016-10-25 NOTE — Discharge Instructions (Signed)

## 2016-10-25 NOTE — ED Triage Notes (Signed)
EMS called to home.  Found patient with complaints of RUQ and Mid upper abdominal pain that started 4 days ago.  Patient has had nausea and vomiting over the last 4 days.  CBG 134 per EMS.  Currently she rates her pain 5 of 10.

## 2016-10-25 NOTE — ED Provider Notes (Signed)
[facility] DEPT Provider Note   CSN: [id number] Arrival date & time: 10/25/16  0330  By signing my name below, I, Higinio Plan, attest that this documentation has been prepared under the direction and in the presence of Ripley Fraise, MD . Electronically Signed: Higinio Plan, Scribe. 10/25/2016. 3:39 AM.  History   Chief Complaint Chief Complaint  Patient presents with  . Abdominal Pain   The history is provided by the patient. No language interpreter was used.   HPI Comments: Kendra Hernandez is a 75 y.o. female with PMHx of GERD, who presents to the Emergency Department complaining of gradually worsening, RUQ abdominal pain that began yesterday. Pt reports multiple episodes of vomiting that also began yesterday with associated diarrhea and intermittent chest pain. She denies hematemesis, blood in her stool, shortness of breath and fever.   Past Medical History:  Diagnosis Date  . Anxiety   . Arthritis   . COPD (chronic obstructive pulmonary disease) ([place])   . Hyperlipidemia   . Hypertension   . Hypothyroid   . Insomnia   . Reflux   . Repeated falls   . Skin cancer   . Stress   . Stroke (cerebrum) [facility])     Patient Active Problem List   Diagnosis Date Noted  . Alcohol withdrawal ([place]) 07/17/2016  . Withdrawal symptoms, alcohol ([place]) 07/17/2016  . Hypothyroidism 07/17/2016  . History of CVA (cerebrovascular accident) 07/17/2016  . Head contusion 07/17/2016  . Fall 07/17/2016  . Insomnia 07/17/2016  . Essential hypertension 07/17/2016  . Abnormality of gait 01/24/2016  . Memory loss 01/24/2016  . Acute right MCA stroke ([place]) 01/09/2016  . Essential hypertension, benign 01/09/2016  . Hyperlipidemia LDL goal <100 01/09/2016  . Other specified hypothyroidism 01/09/2016  . Cervicalgia 01/09/2016  . Esophageal reflux 01/09/2016  . Major depression, chronic 01/09/2016    Past Surgical History:  Procedure Laterality Date  . COLONOSCOPY     With polyp removal   .  MANDIBLE FRACTURE SURGERY     Placement of partial jaw due to abcess  . SKIN CANCER EXCISION     Removal of several skin cancers  . TOE SURGERY Right    Removal of toe     OB History    No data available     Home Medications    Prior to Admission medications   Medication Sig Start Date End Date Taking? Authorizing Provider  acetaminophen (TYLENOL ARTHRITIS PAIN) 650 MG CR tablet Take 650 mg by mouth every 8 (eight) hours as needed for pain.    Historical Provider, MD  aspirin EC 81 MG tablet Take 81 mg by mouth daily. With breakfast    Historical Provider, MD  atorvastatin (LIPITOR) 10 MG tablet TK 1 T PO QD 06/30/16   Historical Provider, MD  baclofen (LIORESAL) 10 MG tablet Take 10 mg by mouth 3 (three) times daily.    Historical Provider, MD  folic acid (FOLVITE) 1 MG tablet Take 1 tablet (1 mg total) by mouth daily. 07/21/16   Reyne Dumas, MD  hydrALAZINE (APRESOLINE) 50 MG tablet Take 50 mg by mouth 2 (two) times daily.    Historical Provider, MD  labetalol (NORMODYNE) 200 MG tablet Take 200 mg by mouth 2 (two) times daily.    Historical Provider, MD  levothyroxine (SYNTHROID, LEVOTHROID) 50 MCG tablet Take 50 mcg by mouth daily before breakfast.    Historical Provider, MD  loratadine (CLARITIN) 10 MG tablet Take 10 mg by mouth daily.    Historical  Provider, MD  nicotine (NICODERM CQ - DOSED IN MG/24 HOURS) 14 mg/24hr patch Place 1 patch (14 mg total) onto the skin daily. 07/21/16   Reyne Dumas, MD  omeprazole (PRILOSEC) 40 MG capsule Take 40 mg by mouth daily.    Historical Provider, MD  potassium chloride (K-DUR) 10 MEQ tablet Take 2 tablets (20 mEq total) by mouth daily. 07/21/16   Reyne Dumas, MD  sucralfate (CARAFATE) 1 g tablet Take 1 g by mouth 4 (four) times daily -  with meals and at bedtime.    Historical Provider, MD  thiamine 100 MG tablet Take 1 tablet (100 mg total) by mouth daily. 07/21/16   Reyne Dumas, MD  traZODone (DESYREL) 100 MG tablet Take 200 mg by mouth at  bedtime. 2 by mouth every evening    Historical Provider, MD  venlafaxine (EFFEXOR) 100 MG tablet Take 100 mg by mouth 2 (two) times daily.    Historical Provider, MD    Family History Family History  Problem Relation Age of Onset  . Emphysema Mother   . Rheumatologic disease Mother   . Heart attack Father   . Emphysema Brother   . Pancreatic cancer Daughter     Social History Social History  Substance Use Topics  . Smoking status: Current Every Day Smoker    Packs/day: 1.00    Years: 30.00    Types: Cigarettes  . Smokeless tobacco: Never Used  . Alcohol use 0.0 oz/week     Allergies   Ceftin [cefuroxime axetil]; Penicillins; and Simvastatin   Review of Systems Review of Systems  Constitutional: Negative for fever.  Respiratory: Negative for shortness of breath.   Cardiovascular: Positive for chest pain.  Gastrointestinal: Positive for abdominal pain, diarrhea, nausea and vomiting. Negative for blood in stool.  All other systems reviewed and are negative.  Physical Exam Updated Vital Signs BP 140/68 (BP Location: Right Arm)   Pulse 88   Temp 97.6 F (36.4 C) (Oral)   Resp 20   Ht 5\' 4"  (1.626 m)   Wt 112 lb (50.8 kg)   SpO2 92%   BMI 19.22 kg/m   Physical Exam CONSTITUTIONAL: elderly and frail HEAD: Normocephalic/atraumatic EYES: EOMI/PERRL, no icterus ENMT: Mucous membranes dry NECK: supple no meningeal signs SPINE/BACK:entire spine nontender CV: S1/S2 noted, no murmurs/rubs/gallops noted LUNGS: Lungs are clear to auscultation bilaterally, no apparent distress ABDOMEN: soft, moderate RUQ tenderness noted, no rebound or guarding, bowel sounds noted throughout abdomen GU:no cva tenderness Rectal: stool color brown, no melena or blood, chaperone present NEURO: Pt is awake/alert/appropriate, moves all extremitiesx4.  No facial droop.   EXTREMITIES: pulses normal/equal, full ROM SKIN: warm, color normal PSYCH: no abnormalities of mood noted, alert and  oriented to situation  ED Treatments / Results  Labs (all labs ordered are listed, but only abnormal results are displayed) Labs Reviewed  CBC WITH DIFFERENTIAL/PLATELET - Abnormal; Notable for the following:       Result Value   RDW 15.8 (*)    All other components within normal limits  COMPREHENSIVE METABOLIC PANEL - Abnormal; Notable for the following:    Potassium 2.8 (*)    Glucose, Bld 127 (*)    All other components within normal limits  URINALYSIS, ROUTINE W REFLEX MICROSCOPIC (NOT AT [facility]) - Abnormal; Notable for the following:    Specific Gravity, Urine >1.046 (*)    All other components within normal limits  LIPASE, BLOOD  I-STAT TROPOININ, ED  POC OCCULT BLOOD, ED  EKG  EKG Interpretation  Date/Time:  Friday October 25 2016 03:44:55 EDT Ventricular Rate:  86 PR Interval:    QRS Duration: 114 QT Interval:  391 QTC Calculation: 468 R Axis:   -51 Text Interpretation:  Sinus rhythm LAD, consider left anterior fascicular block Left ventricular hypertrophy Anterior infarct, old Interpretation limited secondary to artifact Confirmed by Christy Gentles  MD, Zyaire Dumas ([id number]) on 10/25/2016 3:56:19 AM       Radiology Ct Abdomen Pelvis W Contrast  Result Date: 10/25/2016 CLINICAL DATA:  75 year old female with diffuse abdominal pain. EXAM: CT ABDOMEN AND PELVIS WITH CONTRAST TECHNIQUE: Multidetector CT imaging of the abdomen and pelvis was performed using the standard protocol following bolus administration of intravenous contrast. CONTRAST:  125mL ISOVUE-300 IOPAMIDOL (ISOVUE-300) INJECTION 61% COMPARISON:  None for FINDINGS: Lower chest: The visualized lung bases are clear. There is coronary vascular calcification. No intra-abdominal free air or free fluid. Hepatobiliary: Apparent mild diffuse fatty infiltration of the liver. The liver is otherwise unremarkable. No intrahepatic biliary ductal dilatation. The gallbladder appears unremarkable as well. The common bile duct appears  unremarkable. Pancreas: Unremarkable. No pancreatic ductal dilatation or surrounding inflammatory changes. Spleen: Normal in size without focal abnormality. Adrenals/Urinary Tract: The adrenal glands appear unremarkable. There is mild fullness of the right renal collecting system. Correlation with urinalysis recommended to evaluate for UTI. There is no hydronephrosis or nephrolithiasis on either side. The urinary bladder is grossly unremarkable. Stomach/Bowel: There is a moderate size hiatal hernia containing portion of the stomach. No evidence of gastric outlet obstruction. There is a 3.5 cm diverticulum arising from the second portion of the duodenum. There is extensive colonic diverticulosis involving the entire course of the colon. There is diffuse thickened appearance of the colon which may be related to underdistention or represent colitis. Clinical correlation is recommended. Multiple nondilated fluid-filled loops of small bowel throughout the abdomen may be physiologic or represent enteritis. There is no evidence of small-bowel obstruction. Normal appendix. Vascular/Lymphatic: Moderate aortoiliac atherosclerotic disease. There is atherosclerotic calcification and narrowing of the origins of the celiac axis, SMA and IMA. These vessels however appear patent. The SMV, splenic vein, and main portal vein are patent. No portal venous gas identified. There is no adenopathy. Reproductive: The uterus is retroverted and grossly unremarkable. Other: None Musculoskeletal: Osteopenia with degenerative changes of the spine. No acute fracture. Grade 1 L1 and L2 retrolisthesis and L4-L5 anterolisthesis. IMPRESSION: Mild fullness of the right renal collecting system and haziness of the renal sinus fat. Correlation with urinalysis recommended to evaluate for UTI. Nondistended fluid-filled loops of small bowel as well as diffuse thickened appearance of the colon. Clinical correlation is recommended to evaluate for  enterocolitis. No bowel obstruction. Extensive colonic diverticulosis. Electronically Signed   By: Anner Crete M.D.   On: 10/25/2016 05:36    Procedures Procedures (including critical care time)  Medications Ordered in ED Medications  potassium chloride SA (K-DUR,KLOR-CON) CR tablet 40 mEq (not administered)  sodium chloride 0.9 % bolus 500 mL (500 mLs Intravenous New Bag/Given 10/25/16 0401)  ondansetron (ZOFRAN) injection 4 mg (4 mg Intravenous Given 10/25/16 0401)  fentaNYL (SUBLIMAZE) injection 50 mcg (50 mcg Intravenous Given 10/25/16 0401)  iopamidol (ISOVUE-300) 61 % injection 100 mL (100 mLs Intravenous Contrast Given 10/25/16 0503)    DIAGNOSTIC STUDIES:  Oxygen Saturation is 92% on RA, adequate by my interpretation.    COORDINATION OF CARE:  3:37 AM Discussed treatment plan with pt at bedside and pt agreed to plan.  Initial Impression /  Assessment and Plan / ED Course  I have reviewed the triage vital signs and the nursing notes.  Pertinent labs  results that were available during my care of the patient were reviewed by me and considered in my medical decision making (see chart for details).  Clinical Course    Pt with continued diffuse abd pain She is poor historian and also hard of hearing which limits exam CT imaging performed 7:49 AM Pt improved No vomiting CT imaging negative for acute abnormality She is ambulatory She would like to go home She asked me to call her son Gene [phone]) He was updated on plan He reports she is an alcoholic and reports she has been drinking beer this week and feels this is related  Otherwise pt awake/alert, and appropriate for d/c home   I personally performed the services described in this documentation, which was scribed in my presence. The recorded information has been reviewed and is accurate.   Final Clinical Impressions(s) / ED Diagnoses   Final diagnoses:  Generalized abdominal pain  Vomiting and  diarrhea  Hypokalemia    New Prescriptions New Prescriptions   No medications on file     Ripley Fraise, MD 10/25/16 [phone]

## 2016-11-05 ENCOUNTER — Encounter: Payer: Self-pay | Admitting: Nurse Practitioner

## 2016-11-05 ENCOUNTER — Ambulatory Visit (INDEPENDENT_AMBULATORY_CARE_PROVIDER_SITE_OTHER): Payer: Medicare Other | Admitting: Nurse Practitioner

## 2016-11-05 VITALS — BP 142/70 | HR 72 | Ht 64.0 in | Wt 113.0 lb

## 2016-11-05 DIAGNOSIS — E785 Hyperlipidemia, unspecified: Secondary | ICD-10-CM | POA: Diagnosis not present

## 2016-11-05 DIAGNOSIS — I10 Essential (primary) hypertension: Secondary | ICD-10-CM | POA: Diagnosis not present

## 2016-11-05 DIAGNOSIS — R413 Other amnesia: Secondary | ICD-10-CM | POA: Diagnosis not present

## 2016-11-05 DIAGNOSIS — R269 Unspecified abnormalities of gait and mobility: Secondary | ICD-10-CM

## 2016-11-05 DIAGNOSIS — I63511 Cerebral infarction due to unspecified occlusion or stenosis of right middle cerebral artery: Secondary | ICD-10-CM

## 2016-11-05 NOTE — Progress Notes (Signed)
[facility]  PATIENT: Kendra Hernandez DOB: [birth year]   REASON FOR VISIT: Follow-up for CVA, memory loss, gait abnormality HISTORY FROM: Patient and son     HISTORY OF PRESENT ILLNESS:YYSandra Hernandez a 75 years old right-handed female,accompanied by her son Kendra Hernandez, seen in refer by Dr. Delfina Hernandez for evaluation of stroke in Jan 2017.   I reviewed and summarized per hospital record from Kendra Hernandez at Kendra Hernandez,   She had past medical history of hypertension, anxiety, hyperlipidemia, hypothyroidism, COPD, used to be a heavy smoker, and heavy drinker, was admitted to local hospital in January 01 2016 for acute onset of weakness, falling which happened in December 31st 2016. patient is a poor historian, also hard of hearing,,   She lives alone at [facility], prior to the stroke, she smoke at least 1 pack daily, independent of living, still drives, no significant memory trouble, since stroke, she was noted to have increased memory trouble, tends to repeat herself, she continue has mild left-sided weakness, she had a gradual onset gait difficulty for many years, acute worsening since her stroke, she denies bowel and bladder incontinence, she currently lives at [facility], will be discharged to her own apartment soon.   I have reviewed MRI of the brain, showed acute right MCA infarction, per description, involving right parietal, temporal, insular, mild supratentorium small vessel disease   Laboratory evaluation showed normal CBC with hemoglobin of 13 point 6, normal CMP, with creatinine of 0.6 2, GFR more than 90, lipid profile showed elevated cholesterol 279, LDL 201,  Ultrasound of carotid artery showed no evidence of large vessel bilateral internal carotid stenosis  She has a history of chronic neck pain, diffuse body achy pain, fibromyalgia, has a tendency to over medicate herself with pain medications.   UPDATE  June 26th 2017:YY She is better, she walks well but still with left-sided weakness, she can cook, she has difficulty keeping finance, enhanced overspent constantly, she is no longer driving,  She goes over her budget every week,  She does not sleep well, no eating well.  She has no appetite, she still smoke, at least 1ppd, she does drink hard liquor, 40 oz hard liquor a day, bing drink sometimes, she often call taxi to go to liquor store.  UPDATE 11/07/2017CM Kendra Hernandez, 75 year old female returns for follow-up. She has a history of acute right MCA infarction. She is currently on aspirin without recurrent TIA or stroke symptoms. She has not had any bleeding or bruising. She returns with her son today. She claims she has not had any hard liquor in over 2 weeks she still smokes. She has a caregiver 3 hours a day Monday through Friday to assist with errands,  laundry housekeeping and cooking. No recent falls. Son is her power of attorney and does her finances. She continues to live in an apartment setting. She does not drive. She has an appointment with a counselor this week for an initial visit. She returns for reevaluation REVIEW OF SYSTEMS: Full 14 system review of systems performed and notable only for those listed, all others are neg:  Constitutional: neg  Cardiovascular: neg Ear/Nose/Throat: neg  Skin: neg Eyes: Hearing loss Respiratory: neg Gastroitestinal: Urinary incontinence  Hematology/Lymphatic: neg  Endocrine: neg Musculoskeletal: Joint pain neck pain Allergy/Immunology: neg Neurological: neg Psychiatric: Depression and anxiety Sleep : Insomnia ALLERGIES: Allergies  Allergen Reactions  . Ceftin [Cefuroxime Axetil]   . Penicillins Hives    Hives and swelling as a child   .  Simvastatin     Patient can not recall side effect     HOME MEDICATIONS: Outpatient Medications Prior to Visit  Medication Sig Dispense Refill  . aspirin EC 81 MG tablet Take 81 mg by mouth daily. With  breakfast    . atorvastatin (LIPITOR) 10 MG tablet take 1 tablet daily  2  . folic acid (FOLVITE) 1 MG tablet Take 1 tablet (1 mg total) by mouth daily. 30 tablet 1  . hydrALAZINE (APRESOLINE) 50 MG tablet Take 50 mg by mouth 2 (two) times daily.    Marland Kitchen levothyroxine (SYNTHROID, LEVOTHROID) 50 MCG tablet Take 50 mcg by mouth daily before breakfast.    . loratadine (CLARITIN) 10 MG tablet Take 10 mg by mouth daily.    Marland Kitchen omeprazole (PRILOSEC) 40 MG capsule Take 40 mg by mouth daily.    . sucralfate (CARAFATE) 1 g tablet Take 1 g by mouth 4 (four) times daily -  with meals and at bedtime.    . traZODone (DESYREL) 100 MG tablet Take 200 mg by mouth at bedtime. 2 by mouth every evening    . venlafaxine (EFFEXOR) 100 MG tablet Take 100 mg by mouth 2 (two) times daily.    Marland Kitchen labetalol (NORMODYNE) 200 MG tablet Take 200 mg by mouth daily.     Marland Kitchen acetaminophen (TYLENOL ARTHRITIS PAIN) 650 MG CR tablet Take 650 mg by mouth every 8 (eight) hours as needed for pain.    . baclofen (LIORESAL) 10 MG tablet Take 10 mg by mouth 3 (three) times daily.    . nicotine (NICODERM CQ - DOSED IN MG/24 HOURS) 14 mg/24hr patch Place 1 patch (14 mg total) onto the skin daily. (Patient not taking: Reported on 11/05/2016) 28 patch 0  . potassium chloride (K-DUR) 10 MEQ tablet Take 2 tablets (20 mEq total) by mouth daily. (Patient not taking: Reported on 11/05/2016) 60 tablet 1  . thiamine 100 MG tablet Take 1 tablet (100 mg total) by mouth daily. (Patient not taking: Reported on 11/05/2016) 30 tablet 1   No facility-administered medications prior to visit.     PAST MEDICAL HISTORY: Past Medical History:  Diagnosis Date  . Anxiety   . Arthritis   . COPD (chronic obstructive pulmonary disease) ([place])   . Hyperlipidemia   . Hypertension   . Hypothyroid   . Insomnia   . Reflux   . Repeated falls   . Skin cancer   . Stress   . Stroke (cerebrum) ([place])     PAST SURGICAL HISTORY: Past Surgical History:  Procedure  Laterality Date  . COLONOSCOPY     With polyp removal   . MANDIBLE FRACTURE SURGERY     Placement of partial jaw due to abcess  . SKIN CANCER EXCISION     Removal of several skin cancers  . TOE SURGERY Right    Removal of toe     FAMILY HISTORY: Family History  Problem Relation Age of Onset  . Emphysema Mother   . Rheumatologic disease Mother   . Heart attack Father   . Emphysema Brother   . Pancreatic cancer Daughter     SOCIAL HISTORY: Social History   Social History  . Marital status: Divorced    Spouse name: N/A  . Number of Hernandez: 2  . Years of education: 13   Occupational History  . Retired    Social History Main Topics  . Smoking status: Current Every Day Smoker    Packs/day: 1.00    Years:  30.00    Types: Cigarettes  . Smokeless tobacco: Never Used  . Alcohol use 0.0 oz/week  . Drug use: No  . Sexual activity: Not on file   Other Topics Concern  . Not on file   Social History Narrative   Right-handed.   Currently in stroke rehab at [facility].     Occasional caffeine use.     PHYSICAL EXAM  Vitals:   11/05/16 0936  BP: (!) 142/70  Pulse: 72  Weight: 113 lb (51.3 kg)  Height: 5\' 4"  (1.626 m)   Body mass index is 19.4 kg/m.  Generalized: Well developed, in no acute distress , well-groomed Head: normocephalic and atraumatic,. Oropharynx benign  Neck: Supple, no carotid bruits  Cardiac: Regular rate rhythm, no murmur  Musculoskeletal: No deformity   Neurological examination   Mentation: Alert oriented to time, place, history taking.  MMSE - Mini Mental State Exam 11/05/2016 06/24/2016 01/24/2016  Orientation to time 4 5 5   Orientation to Place 4 5 5   Registration 3 3 3   Attention/ Calculation 5 5 5   Recall 3 2 1   Language- name 2 objects 2 2 2   Language- repeat 1 1 1   Language- follow 3 step command 3 3 3   Language- read & follow direction 1 1 1   Write a sentence 1 1 1   Copy design 1 1 1   Total score 28 29 28   Attention span  and concentration appropriate. .  Follows all commands speech and language fluent.   Cranial nerve II-XII: Fundoscopic exam reveals sharp disc margins.Pupils were equal round reactive to light extraocular movements were full, visual field were full on confrontational test. Facial sensation and strength were normal. Hard of hearing . Uvula tongue midline. head turning and shoulder shrug were normal and symmetric.Tongue protrusion into cheek strength was normal. Motor: Mild left upper extremity proximal and distal weakness and mild left lower extremity proximal and distal weakness  Sensory: normal and symmetric to light touch, pinprick, and  Vibration, in the upper and lower extremities Coordination: finger-nose-finger, heel-to-shin bilaterally, no dysmetria Reflexes: Brachioradialis 2/2, biceps 2/2, triceps 2/2, patellar 2/2, Achilles 2/2, plantar responses were flexor bilaterally. Gait and Station: Rising up from seated position with pushoff , mildly unsteady stiff gait no assistive device  DIAGNOSTIC DATA (LABS, IMAGING, TESTING) - I reviewed patient records, labs, notes, testing and imaging myself where available.  Lab Results  Component Value Date   WBC 6.3 10/25/2016   HGB 13.5 10/25/2016   HCT 39.6 10/25/2016   MCV 90.4 10/25/2016   PLT 245 10/25/2016      Component Value Date/Time   NA 137 10/25/2016 0352   NA 139 01/02/2016   K 2.8 (L) 10/25/2016 0352   CL 104 10/25/2016 0352   CO2 23 10/25/2016 0352   GLUCOSE 127 (H) 10/25/2016 0352   BUN 15 10/25/2016 0352   BUN 12 01/02/2016   CREATININE 0.60 10/25/2016 0352   CALCIUM 9.3 10/25/2016 0352   PROT 6.9 10/25/2016 0352   ALBUMIN 4.1 10/25/2016 0352   AST 17 10/25/2016 0352   ALT 15 10/25/2016 0352   ALKPHOS 51 10/25/2016 0352   BILITOT 0.5 10/25/2016 0352   GFRNONAA >60 10/25/2016 0352   GFRAA >60 10/25/2016 0352   Lab Results  Component Value Date   CHOL 279 (A) 01/02/2016   HDL 49 01/02/2016   LDLCALC 201  01/02/2016   TRIG 149 01/02/2016   Lab Results  Component Value Date   HGBA1C 5.1 01/02/2016  No results found for: VITAMINB12 Lab Results  Component Value Date   TSH 1.690 07/19/2016      ASSESSMENT AND PLAN  74 y.o. year old female  has a past medical history Right MCA stroke with mild residual left hemiparesis, memory loss she is to start counseling  PLAN: Continue Aspirin for secondary stroke prevention Exercise by walking HEP per therapy Keep B/P less than [id number] systolic todays reading [id number] Continue Cholesterol medication atorvastin labs followed by PCP. Stop smoking and drinking completely Memory score is stable Given information on sleep hygiene and reviewed with patient and son Follow up in 6 months Dennie Bible, [facility], [facility] [address], [place], [place] [address] [phone]

## 2016-11-05 NOTE — Patient Instructions (Signed)
Continue Aspirin for secondary stroke prevention Exercise by walking HEP per therapy Keep B/P less than [id number] systolic todays reading [id number] Continue Cholesterol medication atorvastin labs followed by PCP. Stop smoking and drinking completely Memory score is stable Given information on sleep hygience Follow up in 6 months

## 2016-11-07 ENCOUNTER — Ambulatory Visit (INDEPENDENT_AMBULATORY_CARE_PROVIDER_SITE_OTHER): Payer: Medicare Other | Admitting: Psychology

## 2016-11-07 DIAGNOSIS — F4323 Adjustment disorder with mixed anxiety and depressed mood: Secondary | ICD-10-CM | POA: Diagnosis not present

## 2016-11-07 NOTE — Progress Notes (Signed)
I have reviewed and agreed above plan. 

## 2016-11-20 DIAGNOSIS — R0602 Shortness of breath: Secondary | ICD-10-CM | POA: Diagnosis not present

## 2016-11-20 DIAGNOSIS — K529 Noninfective gastroenteritis and colitis, unspecified: Secondary | ICD-10-CM | POA: Diagnosis not present

## 2016-11-23 ENCOUNTER — Emergency Department (HOSPITAL_COMMUNITY): Payer: Medicare Other

## 2016-11-23 ENCOUNTER — Emergency Department (HOSPITAL_COMMUNITY)
Admission: EM | Admit: 2016-11-23 | Discharge: 2016-11-24 | Disposition: A | Discharge status: Other Acute Inpatient Hospital | Payer: Medicare Other | Attending: Emergency Medicine | Admitting: Emergency Medicine

## 2016-11-23 DIAGNOSIS — Z8673 Personal history of transient ischemic attack (TIA), and cerebral infarction without residual deficits: Secondary | ICD-10-CM | POA: Insufficient documentation

## 2016-11-23 DIAGNOSIS — R930 Abnormal findings on diagnostic imaging of skull and head, not elsewhere classified: Secondary | ICD-10-CM | POA: Insufficient documentation

## 2016-11-23 DIAGNOSIS — Z9189 Other specified personal risk factors, not elsewhere classified: Secondary | ICD-10-CM

## 2016-11-23 DIAGNOSIS — F332 Major depressive disorder, recurrent severe without psychotic features: Secondary | ICD-10-CM | POA: Diagnosis present

## 2016-11-23 DIAGNOSIS — Y999 Unspecified external cause status: Secondary | ICD-10-CM | POA: Diagnosis not present

## 2016-11-23 DIAGNOSIS — E039 Hypothyroidism, unspecified: Secondary | ICD-10-CM | POA: Insufficient documentation

## 2016-11-23 DIAGNOSIS — Y939 Activity, unspecified: Secondary | ICD-10-CM | POA: Diagnosis not present

## 2016-11-23 DIAGNOSIS — Z85828 Personal history of other malignant neoplasm of skin: Secondary | ICD-10-CM | POA: Insufficient documentation

## 2016-11-23 DIAGNOSIS — F17210 Nicotine dependence, cigarettes, uncomplicated: Secondary | ICD-10-CM | POA: Diagnosis not present

## 2016-11-23 DIAGNOSIS — J449 Chronic obstructive pulmonary disease, unspecified: Secondary | ICD-10-CM | POA: Insufficient documentation

## 2016-11-23 DIAGNOSIS — Z79899 Other long term (current) drug therapy: Secondary | ICD-10-CM | POA: Insufficient documentation

## 2016-11-23 DIAGNOSIS — Y92009 Unspecified place in unspecified non-institutional (private) residence as the place of occurrence of the external cause: Secondary | ICD-10-CM | POA: Diagnosis not present

## 2016-11-23 DIAGNOSIS — I10 Essential (primary) hypertension: Secondary | ICD-10-CM | POA: Insufficient documentation

## 2016-11-23 DIAGNOSIS — R45851 Suicidal ideations: Secondary | ICD-10-CM | POA: Diagnosis not present

## 2016-11-23 DIAGNOSIS — F29 Unspecified psychosis not due to a substance or known physiological condition: Secondary | ICD-10-CM | POA: Diagnosis not present

## 2016-11-23 DIAGNOSIS — Z7982 Long term (current) use of aspirin: Secondary | ICD-10-CM | POA: Insufficient documentation

## 2016-11-23 DIAGNOSIS — X788XXA Intentional self-harm by other sharp object, initial encounter: Secondary | ICD-10-CM | POA: Insufficient documentation

## 2016-11-23 DIAGNOSIS — T1491XA Suicide attempt, initial encounter: Secondary | ICD-10-CM

## 2016-11-23 LAB — COMPREHENSIVE METABOLIC PANEL
ALBUMIN: 4.5 g/dL (ref 3.5–5.0)
ALT: 15 U/L (ref 14–54)
ANION GAP: 10 (ref 5–15)
AST: 17 U/L (ref 15–41)
Alkaline Phosphatase: 59 U/L (ref 38–126)
BILIRUBIN TOTAL: 0.7 mg/dL (ref 0.3–1.2)
BUN: 13 mg/dL (ref 6–20)
CHLORIDE: 107 mmol/L (ref 101–111)
CO2: 23 mmol/L (ref 22–32)
Calcium: 9.6 mg/dL (ref 8.9–10.3)
Creatinine, Ser: 0.49 mg/dL (ref 0.44–1.00)
GFR calc Af Amer: >60 mL/min (ref >60)
Glucose, Bld: 90 mg/dL (ref 65–99)
POTASSIUM: 3.1 mmol/L — AB (ref 3.5–5.1)
Sodium: 140 mmol/L (ref 135–145)
TOTAL PROTEIN: 7.4 g/dL (ref 6.5–8.1)

## 2016-11-23 LAB — CBC WITH DIFFERENTIAL/PLATELET
Basophils Absolute: 0 10*3/uL (ref 0.0–0.1)
Basophils Relative: 1 %
EOS PCT: 2 %
Eosinophils Absolute: 0.1 10*3/uL (ref 0.0–0.7)
HCT: 40.7 % (ref 36.0–46.0)
Hemoglobin: 13.6 g/dL (ref 12.0–15.0)
LYMPHS ABS: 2 10*3/uL (ref 0.7–4.0)
LYMPHS PCT: 41 %
MCH: 31 pg (ref 26.0–34.0)
MCHC: 33.4 g/dL (ref 30.0–36.0)
MCV: 92.7 fL (ref 78.0–100.0)
MONOS PCT: 11 %
Monocytes Absolute: 0.5 10*3/uL (ref 0.1–1.0)
Neutro Abs: 2.2 10*3/uL (ref 1.7–7.7)
Neutrophils Relative %: 45 %
PLATELETS: 187 10*3/uL (ref 150–400)
RBC: 4.39 MIL/uL (ref 3.87–5.11)
RDW: 16 % — ABNORMAL HIGH (ref 11.5–15.5)
WBC: 4.8 10*3/uL (ref 4.0–10.5)

## 2016-11-23 LAB — ETHANOL

## 2016-11-23 LAB — TSH: TSH: 0.707 u[IU]/mL (ref 0.350–4.500)

## 2016-11-23 LAB — BLOOD GAS, VENOUS
Acid-Base Excess: 1.1 mmol/L (ref 0.0–2.0)
BICARBONATE: 24.5 mmol/L (ref 20.0–28.0)
O2 SAT: 61.4 %
PATIENT TEMPERATURE: 98.6
PO2 VEN: 31.3 mmHg — AB (ref 32.0–45.0)
pCO2, Ven: 36.8 mmHg — ABNORMAL LOW (ref 44.0–60.0)
pH, Ven: 7.439 — ABNORMAL HIGH (ref 7.250–7.430)

## 2016-11-23 LAB — SALICYLATE LEVEL: Salicylate Lvl: <7 mg/dL (ref 2.8–30.0)

## 2016-11-23 LAB — ACETAMINOPHEN LEVEL: ACETAMINOPHEN (TYLENOL), SERUM: 36 ug/mL — AB (ref 10–30)

## 2016-11-23 MED ORDER — VENLAFAXINE HCL 50 MG PO TABS
100.0000 mg | ORAL_TABLET | Freq: Two times a day (BID) | ORAL | Status: DC
  Administered 2016-11-23 – 2016-11-24 (×2): 100 mg via ORAL
  Filled 2016-11-23 (×2): qty 2

## 2016-11-23 MED ORDER — AMLODIPINE BESYLATE 5 MG PO TABS
10.0000 mg | ORAL_TABLET | Freq: Every day | ORAL | Status: DC
  Administered 2016-11-24: 10 mg via ORAL
  Filled 2016-11-23: qty 2

## 2016-11-23 MED ORDER — ACETAMINOPHEN 325 MG PO TABS: 650.0000 mg | ORAL_TABLET | Freq: Four times a day (QID) | ORAL | Status: DC | PRN

## 2016-11-23 MED ORDER — LEVOTHYROXINE SODIUM 50 MCG PO TABS
50.0000 ug | ORAL_TABLET | Freq: Every day | ORAL | Status: DC
  Administered 2016-11-24: 50 ug via ORAL
  Filled 2016-11-23 (×2): qty 1

## 2016-11-23 MED ORDER — LABETALOL HCL 200 MG PO TABS
200.0000 mg | ORAL_TABLET | Freq: Every day | ORAL | Status: DC
  Administered 2016-11-24: 200 mg via ORAL
  Filled 2016-11-23: qty 1

## 2016-11-23 MED ORDER — ASPIRIN EC 81 MG PO TBEC
81.0000 mg | DELAYED_RELEASE_TABLET | Freq: Every day | ORAL | Status: DC
  Administered 2016-11-24: 81 mg via ORAL
  Filled 2016-11-23: qty 1

## 2016-11-23 MED ORDER — SUCRALFATE 1 G PO TABS
1.0000 g | ORAL_TABLET | Freq: Three times a day (TID) | ORAL | Status: DC
  Administered 2016-11-23 – 2016-11-24 (×4): 1 g via ORAL
  Filled 2016-11-23 (×6): qty 1

## 2016-11-23 MED ORDER — LORAZEPAM 0.5 MG PO TABS
0.5000 mg | ORAL_TABLET | Freq: Once | ORAL | Status: AC
  Administered 2016-11-23: 0.5 mg via ORAL
  Filled 2016-11-23: qty 1

## 2016-11-23 MED ORDER — HYDRALAZINE HCL 50 MG PO TABS
50.0000 mg | ORAL_TABLET | Freq: Two times a day (BID) | ORAL | Status: DC
  Administered 2016-11-23 – 2016-11-24 (×2): 50 mg via ORAL
  Filled 2016-11-23 (×2): qty 1

## 2016-11-23 MED ORDER — PANTOPRAZOLE SODIUM 40 MG PO TBEC
40.0000 mg | DELAYED_RELEASE_TABLET | Freq: Every day | ORAL | Status: DC
  Administered 2016-11-23 – 2016-11-24 (×2): 40 mg via ORAL
  Filled 2016-11-23 (×2): qty 1

## 2016-11-23 MED ORDER — TRAZODONE HCL 100 MG PO TABS
200.0000 mg | ORAL_TABLET | Freq: Every day | ORAL | Status: DC
  Administered 2016-11-23: 200 mg via ORAL
  Filled 2016-11-23: qty 2

## 2016-11-23 MED ORDER — NICOTINE 21 MG/24HR TD PT24
21.0000 mg | MEDICATED_PATCH | Freq: Every day | TRANSDERMAL | Status: DC
  Administered 2016-11-23 – 2016-11-24 (×2): 21 mg via TRANSDERMAL
  Filled 2016-11-23 (×2): qty 1

## 2016-11-23 MED ORDER — BACITRACIN ZINC 500 UNIT/GM EX OINT
TOPICAL_OINTMENT | Freq: Two times a day (BID) | CUTANEOUS | Status: DC
  Administered 2016-11-23: 22:00:00 via TOPICAL
  Administered 2016-11-24: 15.7778 via TOPICAL
  Filled 2016-11-23: qty 28.35

## 2016-11-23 MED ORDER — FOLIC ACID 1 MG PO TABS
1.0000 mg | ORAL_TABLET | Freq: Every day | ORAL | Status: DC
  Administered 2016-11-23 – 2016-11-24 (×2): 1 mg via ORAL
  Filled 2016-11-23 (×2): qty 1

## 2016-11-23 MED ORDER — POTASSIUM CHLORIDE CRYS ER 20 MEQ PO TBCR
40.0000 meq | EXTENDED_RELEASE_TABLET | Freq: Once | ORAL | Status: AC
  Administered 2016-11-23: 40 meq via ORAL
  Filled 2016-11-23: qty 2

## 2016-11-23 MED ORDER — TETANUS-DIPHTH-ACELL PERTUSSIS 5-2.5-18.5 LF-MCG/0.5 IM SUSP: 0.5000 mL | Freq: Once | INTRAMUSCULAR | Status: DC

## 2016-11-23 MED ORDER — VITAMIN B-1 100 MG PO TABS
100.0000 mg | ORAL_TABLET | Freq: Every day | ORAL | Status: DC
  Administered 2016-11-23 – 2016-11-24 (×2): 100 mg via ORAL
  Filled 2016-11-23 (×2): qty 1

## 2016-11-23 MED ORDER — IBUPROFEN 200 MG PO TABS
400.0000 mg | ORAL_TABLET | Freq: Once | ORAL | Status: AC
  Administered 2016-11-23: 400 mg via ORAL
  Filled 2016-11-23: qty 2

## 2016-11-23 MED ORDER — ATORVASTATIN CALCIUM 20 MG PO TABS
20.0000 mg | ORAL_TABLET | Freq: Every day | ORAL | Status: DC
  Administered 2016-11-23 – 2016-11-24 (×2): 20 mg via ORAL
  Filled 2016-11-23 (×2): qty 1

## 2016-11-23 NOTE — ED Notes (Signed)
Pt currently in room having TTS tele psych assessment done

## 2016-11-23 NOTE — BH Assessment (Signed)
Clinical information faxed to [facility], [place], [place], [place], [place], [place], [place], [place], [place], [place]

## 2016-11-23 NOTE — ED Notes (Signed)
Pt provided heat pack and additional blanket

## 2016-11-23 NOTE — BH Assessment (Addendum)
Assessment Note  Kendra Hernandez is an 75 y.o. female with history of anxiety. She presents to [facility] escorted by [employer]. Patient picked up from her home stating she lives alone. Sts that she cut her wrist (superficial) around noon today. She intentionally tried to harm herself today and admits this was a suicide attempt. Sts, "I guess I really did it for the attention". She sts that her main stressor is lack of support from son. Sts that her son visits her 1x a week to bring her what she needs. Sts, "He refuses to come any other day and I'm left without the things I need for days at a time". She has never tried to harm herself before. No history of self mutilating behaviors. She admits to on-going issues with depression. Sts she often feels fatigue or irritable. Appetite is fair. She sleeps well with medication assistance. She denies HI. No history of aggressive or violent behaviors. No legal issues. No AVH's. Patient denies drug use. She does report drinking a small can of beer occasionally. Last drink was today. She receives outpatient psychiatry with [facility]/Dr. Darleene Cleaver. She is compliant with medications. Patient has no history of INPT mental health treatment. Patient able to currently complete all ADL's. Sts that she however keeps a cane/walker close to her bed. "In case I need it".  Diagnosis: Major Depressive Disorder, Recurrent, Severe without psychotic features  Past Medical History:  Past Medical History:  Diagnosis Date  . Anxiety   . Arthritis   . COPD (chronic obstructive pulmonary disease) ([place])   . Hyperlipidemia   . Hypertension   . Hypothyroid   . Insomnia   . Reflux   . Repeated falls   . Skin cancer   . Stress   . Stroke (cerebrum) [facility])     Past Surgical History:  Procedure Laterality Date  . COLONOSCOPY     With polyp removal   . MANDIBLE FRACTURE SURGERY     Placement of partial jaw due to abcess  . SKIN CANCER EXCISION     Removal of several skin  cancers  . TOE SURGERY Right    Removal of toe     Family History:  Family History  Problem Relation Age of Onset  . Emphysema Mother   . Rheumatologic disease Mother   . Heart attack Father   . Emphysema Brother   . Pancreatic cancer Daughter     Social History:  reports that she has been smoking Cigarettes.  She has a 60.00 pack-year smoking history. She has never used smokeless tobacco. She reports that she drinks alcohol. She reports that she does not use drugs.  Additional Social History:  Substance #1 Name of Substance 1: Alcohol-beer 1 - Age of First Use: 20's 1 - Amount (size/oz): 1 beer 1 - Frequency: "Every now and then" 1 - Duration: on-going  1 - Last Use / Amount: "Late yesterday afternoon"  CIWA: CIWA-Ar BP: 144/81 Pulse Rate: 89 COWS:    Allergies:  Allergies  Allergen Reactions  . Ceftin [Cefuroxime Axetil]   . Penicillins Hives    Hives and swelling as a child Has patient had a PCN reaction causing immediate rash, facial/tongue/throat swelling, SOB or lightheadedness with hypotension: Yes Has patient had a PCN reaction causing severe rash involving mucus membranes or skin necrosis: No Has patient had a PCN reaction that required hospitalization No Has patient had a PCN reaction occurring within the last 10 years: No If all of the above answers are "  NO", then may proceed with Cephalosporin use.   . Simvastatin     Patient can not recall side effect     Home Medications:  (Not in a hospital admission)  OB/GYN Status:  No LMP recorded. Patient is postmenopausal.  General Assessment Data Location of Assessment: [facility] TTS Assessment: In system Is this a Tele or Face-to-Face Assessment?: Face-to-Face Is this an Initial Assessment or a Re-assessment for this encounter?: Initial Assessment Marital status: Divorced [place] [employer] name:  Landscape architect) Is patient pregnant?: No Pregnancy Status: No Living Arrangements: Alone Can pt return to current living  arrangement?: Yes Admission Status: Voluntary Is patient capable of signing voluntary admission?: Yes Referral Source: Other ("The police picked me up") Insurance type:  (MCR and "[employer]")     Crisis Care Plan Living Arrangements: Alone Legal Guardian: Other: Tilford Pillar [phone]) Name of Psychiatrist:  ([facility]) Name of Therapist:  (No therapist )  Education Status Is patient currently in school?: No Highest grade of school patient has completed:  (unk) Name of school:  (n/a) Contact person:  (n/a)  Risk to self with the past 6 months Suicidal Ideation: Yes-Currently Present Has patient been a risk to self within the past 6 months prior to admission? : Yes Suicidal Intent: Yes-Currently Present Has patient had any suicidal intent within the past 6 months prior to admission? : Yes Is patient at risk for suicide?: Yes Suicidal Plan?: Yes-Currently Present Has patient had any suicidal plan within the past 6 months prior to admission? : Yes Specify Current Suicidal Plan:  ("I cut my wrist") Access to Means: Yes Specify Access to Suicidal Means:  (kitchen knife) Previous Attempts/Gestures: No How many times?:  (0) Other Self Harm Risks: denies Triggers for Past Attempts:  (denies previous attempts or gestures) Intentional Self Injurious Behavior: None Family Suicide History: Yes ("My father had a nervous breakdown") Recent stressful life event(s): Other (Comment) (lives alone; son visits 1x per week; lonely; no support) Persecutory voices/beliefs?: No Depression: Yes Depression Symptoms: Fatigue Substance abuse history and/or treatment for substance abuse?: No Suicide prevention information given to non-admitted patients: Not applicable  Risk to Others within the past 6 months Homicidal Ideation: No Does patient have any lifetime risk of violence toward others beyond the six months prior to admission? : No Thoughts of Harm to  Others: No Current Homicidal Intent: No Current Homicidal Plan: No Access to Homicidal Means: No Identified Victim:  (n/a) History of harm to others?: No Assessment of Violence: None Noted Violent Behavior Description:  (patient is calm and cooperative ) Does patient have access to weapons?: No Criminal Charges Pending?: No Does patient have a court date: No Is patient on probation?: No  Psychosis Hallucinations: None noted Delusions: None noted  Mental Status Report Appearance/Hygiene: In scrubs Eye Contact: Good Motor Activity: Freedom of movement Speech: Logical/coherent Level of Consciousness: Alert Mood: Depressed Affect: Appropriate to circumstance Anxiety Level: None Thought Processes: Coherent, Relevant Judgement: Impaired Orientation: Person, Place, Situation, Time Obsessive Compulsive Thoughts/Behaviors: None  Cognitive Functioning Concentration: Decreased Memory: Recent Intact, Remote Intact IQ: Average Insight: Poor Impulse Control: Poor Appetite: Poor Weight Loss:  ("I loose a few pounds.Marland KitchenMarland KitchenI gain a few pounds") Weight Gain:  (none reported) Sleep: No Change Total Hours of Sleep:  ("I sleep ok with my medications") Vegetative Symptoms: None  ADLScreening [facility] Assessment Services) Patient's cognitive ability adequate to safely complete daily activities?: Yes Patient able to express need for assistance with ADLs?: Yes Independently performs ADLs?: Yes (appropriate  for developmental age)  Prior Inpatient Therapy Prior Inpatient Therapy: No Prior Therapy Dates:  (n/a) Prior Therapy Facilty/Provider(s):  (n/a) Reason for Treatment: n/a  Prior Outpatient Therapy Prior Outpatient Therapy: Yes Prior Therapy Dates:  (current) Prior [facility] Facilty/Provider(s):  ("[facility] Dr[facility]aver ) Does patient have an ACCT team?: No Does patient have Intensive In-House Services?  : No Does patient have [facility] services? : No Does patient have  P4CC services?: No  ADL Screening (condition at time of admission) Patient's cognitive ability adequate to safely complete daily activities?: Yes Is the patient deaf or have difficulty hearing?: No Does the patient have difficulty seeing, even when wearing glasses/contacts?: No Does the patient have difficulty concentrating, remembering, or making decisions?: No Patient able to express need for assistance with ADLs?: Yes Does the patient have difficulty dressing or bathing?: No Independently performs ADLs?: Yes (appropriate for developmental age) Does the patient have difficulty walking or climbing stairs?: No Weakness of Legs: None Weakness of Arms/Hands: None  Home Assistive Devices/Equipment Home Assistive Devices/Equipment:  ("I use a cane and walker occasionally.Marland KitchenMarland KitchenI used them both last week because I was so weak")    Abuse/Neglect Assessment (Assessment to be complete while patient is alone) Physical Abuse: Denies Verbal Abuse: Denies Sexual Abuse: Denies Exploitation of patient/patient's resources: Denies Self-Neglect: Denies Values / Beliefs Cultural Requests During Hospitalization: None Spiritual Requests During Hospitalization: None   Advance Directives (For Healthcare) Does Patient Have a Medical Advance Directive?: No Would patient like information on creating a medical advance directive?: No - Patient declined Nutrition Screen- [facility] Patient's home diet: Regular  Additional Information 1:1 In Past 12 Months?: No CIRT Risk: No Elopement Risk: No Does patient have medical clearance?: Yes     Disposition: Per Jinny Blossom, NP, patient meets criteria for INPT treatment. TTS to seek [facility] placement for this patient.  Disposition Initial Assessment Completed for this Encounter: Yes  On Site Evaluation by:   Reviewed with Physician:  Jinny Blossom, NP  Waldon Merl 11/23/2016 5:09 PM

## 2016-11-23 NOTE — ED Provider Notes (Signed)
[facility] DEPT Provider Note   CSN: [id number] Arrival date & time: 11/23/16  1306     History   Chief Complaint Chief Complaint  Patient presents with  . Suicidal  . Suicide Attempt  . Alcohol Intoxication    HPI Kendra Hernandez is a 75 y.o. female.  HPI 75 year old female with past medical history as below who presents with suicide attempt. Patient also has history of chronic alcoholism. Per report from the patient, she lives alone and her son does not visit her frequently. Over the last several days, she has been unable to get out of the house and has been having thoughts of increasing depression. Earlier today, she decided that she would progressively cut her wrist throughout the day and eventually kill her self. She states she had a strong desire to kill her self. She states she has never committed suicide previously. Currently, she endorses dysphoria, poor sleep, generalized anxiety. Denies any fevers or chills. Denies any headache.  Past Medical History:  Diagnosis Date  . Anxiety   . Arthritis   . COPD (chronic obstructive pulmonary disease) ([place])   . Hyperlipidemia   . Hypertension   . Hypothyroid   . Insomnia   . Reflux   . Repeated falls   . Skin cancer   . Stress   . Stroke (cerebrum) [facility])     Patient Active Problem List   Diagnosis Date Noted  . Alcohol withdrawal ([place]) 07/17/2016  . Withdrawal symptoms, alcohol ([place]) 07/17/2016  . Hypothyroidism 07/17/2016  . History of CVA (cerebrovascular accident) 07/17/2016  . Head contusion 07/17/2016  . Fall 07/17/2016  . Insomnia 07/17/2016  . Essential hypertension 07/17/2016  . Abnormality of gait 01/24/2016  . Memory loss 01/24/2016  . Acute right MCA stroke ([place]) 01/09/2016  . Essential hypertension, benign 01/09/2016  . Hyperlipidemia LDL goal <100 01/09/2016  . Other specified hypothyroidism 01/09/2016  . Cervicalgia 01/09/2016  . Esophageal reflux 01/09/2016  . Major depression, chronic  01/09/2016    Past Surgical History:  Procedure Laterality Date  . COLONOSCOPY     With polyp removal   . MANDIBLE FRACTURE SURGERY     Placement of partial jaw due to abcess  . SKIN CANCER EXCISION     Removal of several skin cancers  . TOE SURGERY Right    Removal of toe     OB History    No data available       Home Medications    Prior to Admission medications   Medication Sig Start Date End Date Taking? Authorizing Provider  acetaminophen (TYLENOL) 500 MG tablet Take 1,000 mg by mouth every 6 (six) hours as needed for mild pain.   Yes Historical Provider, MD  amLODipine (NORVASC) 10 MG tablet Take 10 mg by mouth daily.   Yes Historical Provider, MD  aspirin EC 81 MG tablet Take 81 mg by mouth daily. With breakfast   Yes Historical Provider, MD  atorvastatin (LIPITOR) 20 MG tablet Take 20 mg by mouth daily. 11/03/16  Yes Historical Provider, MD  folic acid (FOLVITE) 1 MG tablet Take 1 tablet (1 mg total) by mouth daily. 07/21/16  Yes Reyne Dumas, MD  hydrALAZINE (APRESOLINE) 50 MG tablet Take 50 mg by mouth 2 (two) times daily.   Yes Historical Provider, MD  labetalol (NORMODYNE) 200 MG tablet Take 200 mg by mouth daily.    Yes Historical Provider, MD  levothyroxine (SYNTHROID, LEVOTHROID) 50 MCG tablet Take 50 mcg by mouth daily before breakfast.  Yes Historical Provider, MD  loratadine (CLARITIN) 10 MG tablet Take 10 mg by mouth daily.   Yes Historical Provider, MD  Menthol, Topical Analgesic, (BIOFREEZE EX) Apply 1 application topically 2 (two) times daily as needed (muscle pain). Apply to neck & shoulders twice daily as needed for muscle pain   Yes Historical Provider, MD  omeprazole (PRILOSEC) 40 MG capsule Take 40 mg by mouth daily.   Yes Historical Provider, MD  sucralfate (CARAFATE) 1 g tablet Take 1 g by mouth 4 (four) times daily -  with meals and at bedtime.   Yes Historical Provider, MD  traZODone (DESYREL) 100 MG tablet Take 200 mg by mouth at bedtime. 2 by mouth  every evening   Yes Historical Provider, MD  venlafaxine (EFFEXOR) 100 MG tablet Take 100 mg by mouth 2 (two) times daily.   Yes Historical Provider, MD    Family History Family History  Problem Relation Age of Onset  . Emphysema Mother   . Rheumatologic disease Mother   . Heart attack Father   . Emphysema Brother   . Pancreatic cancer Daughter     Social History Social History  Substance Use Topics  . Smoking status: Current Every Day Smoker    Packs/day: 2.00    Years: 30.00    Types: Cigarettes  . Smokeless tobacco: Never Used  . Alcohol use 0.0 oz/week     Comment: last drink 2 wks ago 11-05-16, but is uncontrollable.     Allergies   Ceftin [cefuroxime axetil]; Penicillins; and Simvastatin   Review of Systems Review of Systems  Constitutional: Negative for chills, fatigue and fever.  HENT: Negative for congestion, rhinorrhea and sore throat.   Eyes: Negative for visual disturbance.  Respiratory: Negative for cough, shortness of breath and wheezing.   Cardiovascular: Negative for chest pain and leg swelling.  Gastrointestinal: Negative for abdominal pain, diarrhea, nausea and vomiting.  Genitourinary: Negative for dysuria, flank pain, vaginal bleeding and vaginal discharge.  Musculoskeletal: Negative for neck pain.  Skin: Negative for rash.  Allergic/Immunologic: Negative for immunocompromised state.  Neurological: Negative for syncope and headaches.  Hematological: Does not bruise/bleed easily.  Psychiatric/Behavioral: Positive for dysphoric mood and suicidal ideas.  All other systems reviewed and are negative.    Physical Exam Updated Vital Signs BP 144/81 (BP Location: Left Arm)   Pulse 89   Resp 22   SpO2 97%   Physical Exam  Constitutional: She is oriented to person, place, and time. She appears well-developed and well-nourished. No distress.  HENT:  Head: Normocephalic and atraumatic.  Mouth/Throat: Oropharynx is clear and moist.  Eyes:  Conjunctivae are normal.  Neck: Neck supple.  Cardiovascular: Normal rate, regular rhythm and normal heart sounds.  Exam reveals no friction rub.   No murmur heard. Pulmonary/Chest: Effort normal and breath sounds normal. No respiratory distress. She has no wheezes. She has no rales.  Tachypneic when MD in room, normal WOB when asleep  Abdominal: Soft. Bowel sounds are normal. She exhibits no distension. There is no tenderness. There is no rebound and no guarding.  Musculoskeletal: She exhibits no edema.  Neurological: She is alert and oriented to person, place, and time. She exhibits normal muscle tone.  Skin: Skin is warm. Capillary refill takes less than 2 seconds.  Superficial excoriations to bilateral wrists. No deep lacerations.  Psychiatric: She has a normal mood and affect.  Nursing note and vitals reviewed.    ED Treatments / Results  Labs (all labs ordered are listed,  but only abnormal results are displayed) Labs Reviewed  COMPREHENSIVE METABOLIC PANEL - Abnormal; Notable for the following:       Result Value   Potassium 3.1 (*)    All other components within normal limits  ACETAMINOPHEN LEVEL - Abnormal; Notable for the following:    Acetaminophen (Tylenol), Serum 36 (*)    All other components within normal limits  CBC WITH DIFFERENTIAL/PLATELET - Abnormal; Notable for the following:    RDW 16.0 (*)    All other components within normal limits  BLOOD GAS, VENOUS - Abnormal; Notable for the following:    pH, Ven 7.439 (*)    pCO2, Ven 36.8 (*)    pO2, Ven 31.3 (*)    All other components within normal limits  ETHANOL  SALICYLATE LEVEL  RAPID URINE DRUG SCREEN, HOSP PERFORMED  URINALYSIS, ROUTINE W REFLEX MICROSCOPIC (NOT AT [employer])    EKG  EKG Interpretation  Date/Time:  Saturday November 23 2016 15:38:34 EST Ventricular Rate:  75 PR Interval:  166 QRS Duration: 104 QT Interval:  398 QTC Calculation: 444 R Axis:   7 Text Interpretation:  Normal sinus rhythm  Anterior infarct , age undetermined Abnormal ECG Baseline wander, otherwise no significant change from prior Confirmed by Keyleigh Manninen MD, Jearldine Cassady [phone]) on 11/23/2016 5:35:37 PM       Radiology Dg Chest 2 View  Result Date: 11/23/2016 CLINICAL DATA:  Hypertension, chronic obstructive pulmonary disease. EXAM: CHEST  2 VIEW COMPARISON:  Radiographs of July 28, 2016. FINDINGS: The heart size and mediastinal contours are within normal limits. Both lungs are clear. The visualized skeletal structures are unremarkable. Stable large hiatal hernia is noted. Stable eventration of the posterior portion of right hemidiaphragm is noted as well. No pneumothorax or pleural effusion is noted. Atherosclerosis of thoracic aorta is noted. IMPRESSION: No active cardiopulmonary disease.  Aortic atherosclerosis. Electronically Signed   By: Marijo Conception, M.D.   On: 11/23/2016 14:20   Ct Head Wo Contrast  Result Date: 11/23/2016 CLINICAL DATA:  Recent suicide attempt EXAM: CT HEAD WITHOUT CONTRAST TECHNIQUE: Contiguous axial images were obtained from the base of the skull through the vertex without intravenous contrast. COMPARISON:  07/16/2016 FINDINGS: Brain: Mild atrophic changes are noted. Areas of lacunar infarcts are noted in the right basal ganglia stable from the prior exam. There is also some volume loss in the right temporal lobe likely related to prior infarct. No acute hemorrhage or acute infarct is noted. Vascular: No hyperdense vessel or unexpected calcification. Skull: Normal. Negative for fracture or focal lesion. Sinuses/Orbits: No acute finding. Other: Previously seen soft tissue hematoma has resolved in the interval. IMPRESSION: Chronic changes without acute abnormality. Electronically Signed   By: Inez Catalina M.D.   On: 11/23/2016 14:14    Procedures Procedures (including critical care time)  Medications Ordered in ED Medications  Tdap (BOOSTRIX) injection 0.5 mL (0.5 mLs Intramuscular Not Given  11/23/16 1439)  amLODipine (NORVASC) tablet 10 mg (not administered)  aspirin EC tablet 81 mg (not administered)  atorvastatin (LIPITOR) tablet 20 mg (20 mg Oral Given 11/23/16 1716)  hydrALAZINE (APRESOLINE) tablet 50 mg (not administered)  labetalol (NORMODYNE) tablet 200 mg (not administered)  levothyroxine (SYNTHROID, LEVOTHROID) tablet 50 mcg (not administered)  pantoprazole (PROTONIX) EC tablet 40 mg (40 mg Oral Given 11/23/16 1644)  sucralfate (CARAFATE) tablet 1 g (1 g Oral Given 11/23/16 1716)  traZODone (DESYREL) tablet 200 mg (not administered)  venlafaxine (EFFEXOR) tablet 100 mg (not administered)  nicotine (NICODERM CQ -  dosed in mg/24 hours) patch 21 mg (21 mg Transdermal Patch Applied 11/23/16 1644)  thiamine (VITAMIN B-1) tablet 100 mg (not administered)  folic acid (FOLVITE) tablet 1 mg (not administered)  potassium chloride SA (K-DUR,KLOR-CON) CR tablet 40 mEq (not administered)  bacitracin ointment (not administered)  LORazepam (ATIVAN) tablet 0.5 mg (0.5 mg Oral Given 11/23/16 1429)     Initial Impression / Assessment and Plan / ED Course  I have reviewed the triage vital signs and the nursing notes.  Pertinent labs & imaging results that were available during my care of the patient were reviewed by me and considered in my medical decision making (see chart for details).   75 year old female with past medical history as above who presents with intentional suicide attempt via slitting her wrists. On arrival, vital signs are stable. She has superficial excoriations to her bilateral wrists with no evidence of deep lacerations. She is neurovascularly intact. Otherwise, she is chronically ill appearing but nontoxic. Screening lab work obtained and is unremarkable. Of note, Tylenol level obtained and is positive, but patient takes Tylenol regularly for chronic pain. I discussed with poison control and no further intervention is indicated. LFTs are normal. Patient is otherwise  medically stable and cleared for psychiatric disposition.  Final Clinical Impressions(s) / ED Diagnoses   Final diagnoses:  Suicide attempt  At high risk for self harm    New Prescriptions New Prescriptions   No medications on file     Duffy Bruce, MD 11/23/16 1739

## 2016-11-23 NOTE — ED Notes (Signed)
Pt dressed out in paper scrubs, belongings placed in [facility] [address], pt has been wanded by security.

## 2016-11-23 NOTE — ED Notes (Signed)
Gave patient a urine specimen cup and patient "missed the cup" when she voided, placed a collection hat in toilet and instructed patient to urinate in it next time she has to go, pt verbalized understanding.

## 2016-11-23 NOTE — ED Notes (Signed)
Pt is alert and oriented, able to complete ADL's independently, gait is steady, has been ambulating to bathroom and to nurses station without assistance.

## 2016-11-23 NOTE — ED Triage Notes (Signed)
Son Engineer, maintenance called stating he did not want to talk to his mother, "you can call Social Services or who ever, I'm not taking her back" "She has a drinking problem and probably drugs," Waunita Schooner TTS made aware of call

## 2016-11-23 NOTE — ED Notes (Signed)
Bed: [facility] Expected date:  Expected time:  Means of arrival:  Comments: Tcu 30

## 2016-11-23 NOTE — ED Notes (Signed)
Report given to [facility] RN 

## 2016-11-23 NOTE — ED Notes (Signed)
[facility] not able to take report at this time, will call back in a few minutes to give report.

## 2016-11-23 NOTE — ED Notes (Signed)
[facility]AR Report received from previous nurse. Pt received calm and visible on unit. Pt denies current SI/ HI, A/V H, depression, anxiety, RATES PAIN 6/10 , and is otherwise stable. Pt reminded of camera surveillance, q 15 min rounds, and rules of the milieu. Pt screened for contraband by Probation officer, will continue to assess.

## 2016-11-23 NOTE — ED Triage Notes (Signed)
Pt bib [employer] and EMS after cutting bilateral wrists in a suicide attempt, pt has been drinking alcohol, pt is being IVC'd, pt has superficial cuts to bilateral wrists, BP 152/90, HR 94 RA, RR 20

## 2016-11-24 DIAGNOSIS — Z85828 Personal history of other malignant neoplasm of skin: Secondary | ICD-10-CM | POA: Diagnosis not present

## 2016-11-24 DIAGNOSIS — Z8673 Personal history of transient ischemic attack (TIA), and cerebral infarction without residual deficits: Secondary | ICD-10-CM | POA: Diagnosis not present

## 2016-11-24 DIAGNOSIS — E039 Hypothyroidism, unspecified: Secondary | ICD-10-CM | POA: Diagnosis not present

## 2016-11-24 DIAGNOSIS — S61511A Laceration without foreign body of right wrist, initial encounter: Secondary | ICD-10-CM | POA: Diagnosis present

## 2016-11-24 DIAGNOSIS — F332 Major depressive disorder, recurrent severe without psychotic features: Secondary | ICD-10-CM | POA: Diagnosis present

## 2016-11-24 DIAGNOSIS — T1491XA Suicide attempt, initial encounter: Secondary | ICD-10-CM | POA: Diagnosis not present

## 2016-11-24 DIAGNOSIS — S61512A Laceration without foreign body of left wrist, initial encounter: Secondary | ICD-10-CM | POA: Diagnosis present

## 2016-11-24 DIAGNOSIS — Z880 Allergy status to penicillin: Secondary | ICD-10-CM | POA: Diagnosis not present

## 2016-11-24 DIAGNOSIS — F410 Panic disorder [episodic paroxysmal anxiety] without agoraphobia: Secondary | ICD-10-CM | POA: Diagnosis present

## 2016-11-24 DIAGNOSIS — J449 Chronic obstructive pulmonary disease, unspecified: Secondary | ICD-10-CM | POA: Diagnosis not present

## 2016-11-24 DIAGNOSIS — E785 Hyperlipidemia, unspecified: Secondary | ICD-10-CM | POA: Diagnosis present

## 2016-11-24 DIAGNOSIS — I10 Essential (primary) hypertension: Secondary | ICD-10-CM | POA: Diagnosis not present

## 2016-11-24 DIAGNOSIS — Z888 Allergy status to other drugs, medicaments and biological substances status: Secondary | ICD-10-CM | POA: Diagnosis not present

## 2016-11-24 DIAGNOSIS — F333 Major depressive disorder, recurrent, severe with psychotic symptoms: Secondary | ICD-10-CM | POA: Diagnosis not present

## 2016-11-24 DIAGNOSIS — G4700 Insomnia, unspecified: Secondary | ICD-10-CM | POA: Diagnosis present

## 2016-11-24 DIAGNOSIS — F17210 Nicotine dependence, cigarettes, uncomplicated: Secondary | ICD-10-CM | POA: Diagnosis not present

## 2016-11-24 DIAGNOSIS — M1990 Unspecified osteoarthritis, unspecified site: Secondary | ICD-10-CM | POA: Diagnosis present

## 2016-11-24 DIAGNOSIS — K219 Gastro-esophageal reflux disease without esophagitis: Secondary | ICD-10-CM | POA: Diagnosis present

## 2016-11-24 LAB — RAPID URINE DRUG SCREEN, HOSP PERFORMED
AMPHETAMINES: NOT DETECTED
BARBITURATES: NOT DETECTED
BENZODIAZEPINES: NOT DETECTED
COCAINE: NOT DETECTED
Opiates: NOT DETECTED
Tetrahydrocannabinol: NOT DETECTED

## 2016-11-24 LAB — URINALYSIS, ROUTINE W REFLEX MICROSCOPIC
BILIRUBIN URINE: NEGATIVE
GLUCOSE, UA: NEGATIVE mg/dL
Hgb urine dipstick: NEGATIVE
KETONES UR: NEGATIVE mg/dL
LEUKOCYTES UA: NEGATIVE
Nitrite: NEGATIVE
PH: 6.5 (ref 5.0–8.0)
PROTEIN: NEGATIVE mg/dL
Specific Gravity, Urine: 1.02 (ref 1.005–1.030)

## 2016-11-24 MED ORDER — IBUPROFEN 200 MG PO TABS
400.0000 mg | ORAL_TABLET | Freq: Once | ORAL | Status: AC
  Administered 2016-11-24: 400 mg via ORAL
  Filled 2016-11-24: qty 2

## 2016-11-24 NOTE — ED Notes (Signed)
Pt provided very small amount of urin, sent to lab.

## 2016-11-24 NOTE — ED Notes (Signed)
Sheriff on unit to transfer pt to [facility]  per MD order. This nurse gave sheriff two bags of pt belongings for transfer. Sheriff also given transfer form. No s/s of distress noted. Pt ambulatory off unit with sheriff.

## 2016-11-24 NOTE — ED Notes (Signed)
Sheriff called for transport  

## 2016-11-24 NOTE — Progress Notes (Signed)
[facility] called stating pt's referral is being reviewed- had questions patient medications. Directed call to [facility].   Sharren Bridge, MSW, LCSW Clinical Social Work, Disposition  11/24/2016 [phone]

## 2016-11-24 NOTE — ED Notes (Signed)
Attempted to call report to [facility]- informed that nurse will not take report until transportation is here for pt. This nurse will call transportation.

## 2016-11-24 NOTE — ED Notes (Signed)
[name] at [facility] called-pt has been accepted for there geri psych unit by Dr Arlyn Leak Challa--call report to [phone] IVC to [phone]

## 2016-12-20 ENCOUNTER — Emergency Department (HOSPITAL_COMMUNITY)
Admission: EM | Admit: 2016-12-20 | Discharge: 2016-12-21 | Disposition: A | Discharge status: Other Acute Inpatient Hospital | Payer: Medicare Other | Attending: Emergency Medicine | Admitting: Emergency Medicine

## 2016-12-20 DIAGNOSIS — J449 Chronic obstructive pulmonary disease, unspecified: Secondary | ICD-10-CM | POA: Insufficient documentation

## 2016-12-20 DIAGNOSIS — R45851 Suicidal ideations: Secondary | ICD-10-CM

## 2016-12-20 DIAGNOSIS — Z7982 Long term (current) use of aspirin: Secondary | ICD-10-CM | POA: Insufficient documentation

## 2016-12-20 DIAGNOSIS — Z9889 Other specified postprocedural states: Secondary | ICD-10-CM | POA: Diagnosis not present

## 2016-12-20 DIAGNOSIS — I10 Essential (primary) hypertension: Secondary | ICD-10-CM | POA: Insufficient documentation

## 2016-12-20 DIAGNOSIS — Z79899 Other long term (current) drug therapy: Secondary | ICD-10-CM | POA: Insufficient documentation

## 2016-12-20 DIAGNOSIS — E039 Hypothyroidism, unspecified: Secondary | ICD-10-CM | POA: Insufficient documentation

## 2016-12-20 DIAGNOSIS — Z85828 Personal history of other malignant neoplasm of skin: Secondary | ICD-10-CM | POA: Diagnosis not present

## 2016-12-20 DIAGNOSIS — Z8249 Family history of ischemic heart disease and other diseases of the circulatory system: Secondary | ICD-10-CM | POA: Diagnosis not present

## 2016-12-20 DIAGNOSIS — F17210 Nicotine dependence, cigarettes, uncomplicated: Secondary | ICD-10-CM | POA: Diagnosis not present

## 2016-12-20 DIAGNOSIS — F339 Major depressive disorder, recurrent, unspecified: Secondary | ICD-10-CM | POA: Diagnosis present

## 2016-12-20 DIAGNOSIS — Z8673 Personal history of transient ischemic attack (TIA), and cerebral infarction without residual deficits: Secondary | ICD-10-CM | POA: Diagnosis not present

## 2016-12-20 DIAGNOSIS — F332 Major depressive disorder, recurrent severe without psychotic features: Secondary | ICD-10-CM | POA: Diagnosis not present

## 2016-12-20 DIAGNOSIS — Z8489 Family history of other specified conditions: Secondary | ICD-10-CM | POA: Diagnosis not present

## 2016-12-20 LAB — ACETAMINOPHEN LEVEL: Acetaminophen (Tylenol), Serum: 42 ug/mL — ABNORMAL HIGH (ref 10–30)

## 2016-12-20 LAB — COMPREHENSIVE METABOLIC PANEL
ALBUMIN: 4.5 g/dL (ref 3.5–5.0)
ALT: 13 U/L — ABNORMAL LOW (ref 14–54)
AST: 15 U/L (ref 15–41)
Alkaline Phosphatase: 74 U/L (ref 38–126)
Anion gap: 13 (ref 5–15)
BILIRUBIN TOTAL: 0.6 mg/dL (ref 0.3–1.2)
BUN: 13 mg/dL (ref 6–20)
CO2: 20 mmol/L — AB (ref 22–32)
Calcium: 9.2 mg/dL (ref 8.9–10.3)
Chloride: 105 mmol/L (ref 101–111)
Creatinine, Ser: 0.48 mg/dL (ref 0.44–1.00)
GFR calc Af Amer: >60 mL/min (ref >60)
GFR calc non Af Amer: >60 mL/min (ref >60)
GLUCOSE: 161 mg/dL — AB (ref 65–99)
POTASSIUM: 3.1 mmol/L — AB (ref 3.5–5.1)
Sodium: 138 mmol/L (ref 135–145)
TOTAL PROTEIN: 7.4 g/dL (ref 6.5–8.1)

## 2016-12-20 LAB — RAPID URINE DRUG SCREEN, HOSP PERFORMED
AMPHETAMINES: NOT DETECTED
BARBITURATES: NOT DETECTED
BENZODIAZEPINES: NOT DETECTED
Cocaine: NOT DETECTED
Opiates: NOT DETECTED
TETRAHYDROCANNABINOL: NOT DETECTED

## 2016-12-20 LAB — ETHANOL: Alcohol, Ethyl (B): <5 mg/dL (ref <5)

## 2016-12-20 LAB — CBC
HEMATOCRIT: 39.2 % (ref 36.0–46.0)
HEMOGLOBIN: 13.7 g/dL (ref 12.0–15.0)
MCH: 32.2 pg (ref 26.0–34.0)
MCHC: 34.9 g/dL (ref 30.0–36.0)
MCV: 92.2 fL (ref 78.0–100.0)
Platelets: 219 10*3/uL (ref 150–400)
RBC: 4.25 MIL/uL (ref 3.87–5.11)
RDW: 13.7 % (ref 11.5–15.5)
WBC: 6.3 10*3/uL (ref 4.0–10.5)

## 2016-12-20 LAB — SALICYLATE LEVEL: Salicylate Lvl: 29 mg/dL (ref 2.8–30.0)

## 2016-12-20 MED ORDER — VENLAFAXINE HCL 75 MG PO TABS
75.0000 mg | ORAL_TABLET | Freq: Two times a day (BID) | ORAL | Status: DC
  Administered 2016-12-20 – 2016-12-21 (×2): 75 mg via ORAL
  Filled 2016-12-20 (×2): qty 1

## 2016-12-20 MED ORDER — HYDRALAZINE HCL 50 MG PO TABS
50.0000 mg | ORAL_TABLET | Freq: Two times a day (BID) | ORAL | Status: DC
  Administered 2016-12-21: 50 mg via ORAL
  Filled 2016-12-20 (×2): qty 1

## 2016-12-20 MED ORDER — VENLAFAXINE HCL 50 MG PO TABS
100.0000 mg | ORAL_TABLET | Freq: Two times a day (BID) | ORAL | Status: DC
  Administered 2016-12-20: 100 mg via ORAL
  Filled 2016-12-20 (×2): qty 2

## 2016-12-20 MED ORDER — ASPIRIN EC 81 MG PO TBEC
81.0000 mg | DELAYED_RELEASE_TABLET | Freq: Every day | ORAL | Status: DC
Start: 2016-12-21 — End: 2016-12-21
  Administered 2016-12-21: 81 mg via ORAL
  Filled 2016-12-20: qty 1

## 2016-12-20 MED ORDER — ONDANSETRON 4 MG PO TBDP
ORAL_TABLET | ORAL | Status: AC
  Filled 2016-12-20: qty 1

## 2016-12-20 MED ORDER — QUETIAPINE FUMARATE 25 MG PO TABS
25.0000 mg | ORAL_TABLET | Freq: Every day | ORAL | Status: DC
  Administered 2016-12-20: 25 mg via ORAL
  Filled 2016-12-20: qty 1

## 2016-12-20 MED ORDER — ONDANSETRON 4 MG PO TBDP
4.0000 mg | ORAL_TABLET | Freq: Once | ORAL | Status: AC
  Administered 2016-12-20: 4 mg via ORAL

## 2016-12-20 MED ORDER — IBUPROFEN 200 MG PO TABS: 600.0000 mg | ORAL_TABLET | Freq: Four times a day (QID) | ORAL | Status: DC | PRN

## 2016-12-20 MED ORDER — POTASSIUM CHLORIDE CRYS ER 20 MEQ PO TBCR
60.0000 meq | EXTENDED_RELEASE_TABLET | Freq: Once | ORAL | Status: AC
  Administered 2016-12-20: 60 meq via ORAL
  Filled 2016-12-20: qty 3

## 2016-12-20 MED ORDER — IBUPROFEN 200 MG PO TABS
600.0000 mg | ORAL_TABLET | Freq: Four times a day (QID) | ORAL | Status: DC | PRN
  Administered 2016-12-21 (×2): 600 mg via ORAL
  Filled 2016-12-20 (×2): qty 3

## 2016-12-20 MED ORDER — ATORVASTATIN CALCIUM 20 MG PO TABS
20.0000 mg | ORAL_TABLET | Freq: Every day | ORAL | Status: DC
Start: 2016-12-21 — End: 2016-12-21
  Administered 2016-12-21: 20 mg via ORAL
  Filled 2016-12-20 (×2): qty 1

## 2016-12-20 MED ORDER — LORATADINE 10 MG PO TABS
10.0000 mg | ORAL_TABLET | Freq: Every day | ORAL | Status: DC
  Administered 2016-12-21: 10 mg via ORAL
  Filled 2016-12-20: qty 1

## 2016-12-20 MED ORDER — FOLIC ACID 1 MG PO TABS
1.0000 mg | ORAL_TABLET | Freq: Every day | ORAL | Status: DC
  Administered 2016-12-20 – 2016-12-21 (×2): 1 mg via ORAL
  Filled 2016-12-20 (×2): qty 1

## 2016-12-20 MED ORDER — SUCRALFATE 1 G PO TABS
1.0000 g | ORAL_TABLET | Freq: Three times a day (TID) | ORAL | Status: DC
  Administered 2016-12-20 – 2016-12-21 (×3): 1 g via ORAL
  Filled 2016-12-20 (×4): qty 1

## 2016-12-20 MED ORDER — LEVOTHYROXINE SODIUM 50 MCG PO TABS
50.0000 ug | ORAL_TABLET | Freq: Every day | ORAL | Status: DC
  Administered 2016-12-21: 50 ug via ORAL
  Filled 2016-12-20: qty 1

## 2016-12-20 MED ORDER — LABETALOL HCL 200 MG PO TABS
200.0000 mg | ORAL_TABLET | Freq: Two times a day (BID) | ORAL | Status: DC
  Administered 2016-12-21: 200 mg via ORAL
  Filled 2016-12-20 (×2): qty 1

## 2016-12-20 MED ORDER — PANTOPRAZOLE SODIUM 40 MG PO TBEC
40.0000 mg | DELAYED_RELEASE_TABLET | Freq: Every day | ORAL | Status: DC
  Administered 2016-12-20 – 2016-12-21 (×2): 40 mg via ORAL
  Filled 2016-12-20 (×2): qty 1

## 2016-12-20 MED ORDER — AMLODIPINE BESYLATE 5 MG PO TABS
10.0000 mg | ORAL_TABLET | Freq: Every day | ORAL | Status: DC
  Administered 2016-12-20 – 2016-12-21 (×2): 10 mg via ORAL
  Filled 2016-12-20 (×2): qty 2

## 2016-12-20 NOTE — ED Provider Notes (Signed)
[facility] DEPT Provider Note   CSN: [id number] Arrival date & time: 12/20/16  1730  By signing my name below, I, Soijett Blue, attest that this documentation has been prepared under the direction and in the presence of Eliezer Mccoy, PA-C Electronically Signed: Soijett Blue, ED Scribe. 12/20/16. 5:56 PM.  History   Chief Complaint Chief Complaint  Patient presents with  . Suicidal    HPI Kendra Hernandez is a 75 y.o. female with a PMHx of anxiety, HTN, COPD, who presents to the Emergency Department via [employer] due to IVC paperwork being drawn out by her son onset today. Pt notes that she is depressed and threatened to commit suicide with a plan to cut her bilateral wrists. Pt states that she has been taking her daily medications. Pt notes that she was hospitalized several months ago due to a suicide attempt of cutting her wrists. Pt is having associated symptoms of nausea and anxiety.  She denies HI, auditory or visual hallucinations, abdominal pain, and any other symptoms. Denies ETOH or illegal drug use. Pt notes that she smokes cigarettes.    The history is provided by the patient. No language interpreter was used.    Past Medical History:  Diagnosis Date  . Anxiety   . Arthritis   . COPD (chronic obstructive pulmonary disease) ([place])   . Hyperlipidemia   . Hypertension   . Hypothyroid   . Insomnia   . Reflux   . Repeated falls   . Skin cancer   . Stress   . Stroke (cerebrum) [facility])     Patient Active Problem List   Diagnosis Date Noted  . Major depressive disorder, recurrent severe without psychotic features ([place]) 11/24/2016  . Alcohol withdrawal ([place]) 07/17/2016  . Withdrawal symptoms, alcohol ([place]) 07/17/2016  . Hypothyroidism 07/17/2016  . History of CVA (cerebrovascular accident) 07/17/2016  . Head contusion 07/17/2016  . Fall 07/17/2016  . Insomnia 07/17/2016  . Essential hypertension 07/17/2016  . Abnormality of gait 01/24/2016  . Memory loss 01/24/2016  .  Acute right MCA stroke ([place]) 01/09/2016  . Essential hypertension, benign 01/09/2016  . Hyperlipidemia LDL goal <100 01/09/2016  . Other specified hypothyroidism 01/09/2016  . Cervicalgia 01/09/2016  . Esophageal reflux 01/09/2016  . Major depression, chronic 01/09/2016    Past Surgical History:  Procedure Laterality Date  . COLONOSCOPY     With polyp removal   . MANDIBLE FRACTURE SURGERY     Placement of partial jaw due to abcess  . SKIN CANCER EXCISION     Removal of several skin cancers  . TOE SURGERY Right    Removal of toe     OB History    No data available       Home Medications    Prior to Admission medications   Medication Sig Start Date End Date Taking? Authorizing Provider  acetaminophen (TYLENOL) 500 MG tablet Take 1,000 mg by mouth every 6 (six) hours as needed for mild pain, moderate pain, fever or headache.    Yes Historical Provider, MD  amLODipine (NORVASC) 10 MG tablet Take 10 mg by mouth daily.   Yes Historical Provider, MD  aspirin EC 81 MG tablet Take 81 mg by mouth daily.    Yes Historical Provider, MD  atorvastatin (LIPITOR) 20 MG tablet Take 20 mg by mouth daily.   Yes Historical Provider, MD  folic acid (FOLVITE) 1 MG tablet Take 1 tablet (1 mg total) by mouth daily. 07/21/16  Yes Reyne Dumas, MD  hydrALAZINE (APRESOLINE)  50 MG tablet Take 50 mg by mouth 2 (two) times daily.   Yes Historical Provider, MD  labetalol (NORMODYNE) 200 MG tablet Take 200 mg by mouth 2 (two) times daily.    Yes Historical Provider, MD  levothyroxine (SYNTHROID, LEVOTHROID) 50 MCG tablet Take 50 mcg by mouth daily before breakfast.   Yes Historical Provider, MD  loratadine (CLARITIN) 10 MG tablet Take 10 mg by mouth daily.   Yes Historical Provider, MD  Menthol, Topical Analgesic, (BIOFREEZE EX) Apply 1 application topically as needed (for muscle pain). Pt applies to neck and shoulders.   Yes Historical Provider, MD  omeprazole (PRILOSEC) 40 MG capsule Take 40 mg by mouth  daily.   Yes Historical Provider, MD  QUEtiapine (SEROQUEL) 25 MG tablet Take 25 mg by mouth at bedtime.   Yes Historical Provider, MD  sucralfate (CARAFATE) 1 g tablet Take 1 g by mouth 4 (four) times daily -  with meals and at bedtime.   Yes Historical Provider, MD  traZODone (DESYREL) 100 MG tablet Take 200 mg by mouth at bedtime.    Yes Historical Provider, MD  venlafaxine (EFFEXOR) 100 MG tablet Take 100 mg by mouth 2 (two) times daily. Pt takes with a 75mg  tablet.   Yes Historical Provider, MD  venlafaxine (EFFEXOR) 75 MG tablet Take 75 mg by mouth 2 (two) times daily. Pt takes with a 100mg  tablet.   Yes Historical Provider, MD    Family History Family History  Problem Relation Age of Onset  . Emphysema Mother   . Rheumatologic disease Mother   . Heart attack Father   . Emphysema Brother   . Pancreatic cancer Daughter     Social History Social History  Substance Use Topics  . Smoking status: Current Every Day Smoker    Packs/day: 2.00    Years: 30.00    Types: Cigarettes  . Smokeless tobacco: Never Used  . Alcohol use 0.0 oz/week     Comment: last drink 2 wks ago 11-05-16, but is uncontrollable.     Allergies   Ceftin [cefuroxime axetil]; Penicillins; and Simvastatin   Review of Systems Review of Systems  Gastrointestinal: Positive for nausea. Negative for abdominal pain.  Psychiatric/Behavioral: Positive for suicidal ideas. Negative for hallucinations and self-injury. The patient is nervous/anxious.        No HI.     Physical Exam Updated Vital Signs BP 148/65 (BP Location: Right Arm)   Pulse 90   Temp 97.7 F (36.5 C) (Oral)   Resp 18   SpO2 94%   Physical Exam  Constitutional: She appears well-developed and well-nourished. No distress.  HENT:  Head: Normocephalic and atraumatic.  Mouth/Throat: Oropharynx is clear and moist. No oropharyngeal exudate.  Eyes: Conjunctivae are normal. Pupils are equal, round, and reactive to light. Right eye exhibits no  discharge. Left eye exhibits no discharge. No scleral icterus.  Neck: Normal range of motion. Neck supple. No thyromegaly present.  Cardiovascular: Normal rate, regular rhythm and normal heart sounds.  Exam reveals no gallop and no friction rub.   No murmur heard. Pulmonary/Chest: Effort normal and breath sounds normal. No stridor. No respiratory distress. She has no wheezes. She has no rales.  Abdominal: Soft. Bowel sounds are normal. She exhibits no distension. There is no tenderness. There is no rebound and no guarding.  Nausea worsened on palpation of epigastrium, but without pain.  Musculoskeletal: She exhibits no edema.  Lymphadenopathy:    She has no cervical adenopathy.  Neurological: She is  alert. Coordination normal.  Skin: Skin is warm and dry. No rash noted. She is not diaphoretic. No pallor.  Psychiatric: Her speech is normal and behavior is normal. Her mood appears anxious. She is not actively hallucinating. She exhibits a depressed mood. She expresses suicidal ideation. She expresses no homicidal ideation. She expresses suicidal plans. She expresses no homicidal plans.  Nursing note and vitals reviewed.    ED Treatments / Results  DIAGNOSTIC STUDIES: Oxygen Saturation is 94% on RA, nl by my interpretation.    COORDINATION OF CARE: 5:53 PM Discussed treatment plan with pt at bedside which includes UA, labs, consult with attending, consult with TTS, and pt agreed to plan.   Labs (all labs ordered are listed, but only abnormal results are displayed) Labs Reviewed  COMPREHENSIVE METABOLIC PANEL - Abnormal; Notable for the following:       Result Value   Potassium 3.1 (*)    CO2 20 (*)    Glucose, Bld 161 (*)    ALT 13 (*)    All other components within normal limits  ACETAMINOPHEN LEVEL - Abnormal; Notable for the following:    Acetaminophen (Tylenol), Serum 42 (*)    All other components within normal limits  ETHANOL  SALICYLATE LEVEL  CBC  RAPID URINE DRUG  SCREEN, HOSP PERFORMED    EKG  EKG Interpretation None       Radiology No results found.  Procedures Procedures (including critical care time)  Medications Ordered in ED Medications  potassium chloride SA (K-DUR,KLOR-CON) CR tablet 60 mEq (not administered)  ondansetron (ZOFRAN-ODT) disintegrating tablet 4 mg (4 mg Oral Given 12/20/16 1903)     Initial Impression / Assessment and Plan / ED Course  I have reviewed the triage vital signs and the nursing notes.  Pertinent labs & imaging results that were available during my care of the patient were reviewed by me and considered in my medical decision making (see chart for details).  Clinical Course     CBC unremarkable. CMP shows potassium 3.1, replaced in the ED; CO2 20, glucose 161, AST 13. Ethanol and salicylate negative. Acetaminophen level 42, most likely chronic, as patient takes 4000 mg daily. Patient is medically cleared. TTS consult pending. TTS reported the patient meets criteria for inpatient treatment and will search for placement. Transfer care to Dr. Leonette [facility] at shift change.  Final Clinical Impressions(s) / ED Diagnoses   Final diagnoses:  Suicidal ideation    New Prescriptions New Prescriptions   No medications on file   I personally performed the services described in this documentation, which was scribed in my presence. The recorded information has been reviewed and is accurate.        Frederica Kuster, PA-C 12/20/16 2026    Fatima Blank, MD 12/21/16 1515

## 2016-12-20 NOTE — ED Triage Notes (Signed)
Pt BIB police today after her son took out IVC paperwork saying that she was depressed and threatened to cut her wrists. Pt did admit to these accusations to [employer]. Alert and oriented.

## 2016-12-20 NOTE — ED Notes (Signed)
Pt began vomiting in sink and c/o nausea. PA notified. Verbal order placed.

## 2016-12-20 NOTE — ED Notes (Signed)
Pt changed into paper scrubs and was wanded by security. Two patient belongings bags were labeled and placed in the belongings cabinet in triage.

## 2016-12-20 NOTE — BH Assessment (Signed)
Tele Assessment Note   Kendra Hernandez is an 75 y.o. female presenting to [facility] due to suicidal ideation with a plan to cut her wrist. Pt stated "I am depressed and I thought about committing suicide". Pt shared that her recent stressors are the holiday season and feeling lonesome. Pt reported that she lives alone and has a caregiver that comes by 5 days a week. Pt reported that she attempted suicide several weeks ago and was sent to [facility] for psychiatric hospitalization. Pt is endorsing multiple depressive symptoms and shared that her appetite has been poor. No issues with her sleep or hygiene reported. No drug use reported. Pt denies HI and AVH at this time. No physical, sexual or emotional abuse reported.   Collateral information gathered from pt's son, Gene Gay Filler who reported that pt called him several times threatening to kill herself. He shared that 3 weeks ago pt attempted to cut her wrist and was hospitalized. He denied being pt's healthcare power of attorney but shared that he has the paperwork from the clerk of court to be filed.   Diagnosis: Major Depressive Disorder, Recurrent episode, Severe  Past Medical History:  Past Medical History:  Diagnosis Date  . Anxiety   . Arthritis   . COPD (chronic obstructive pulmonary disease) ([place])   . Hyperlipidemia   . Hypertension   . Hypothyroid   . Insomnia   . Reflux   . Repeated falls   . Skin cancer   . Stress   . Stroke (cerebrum) [facility])     Past Surgical History:  Procedure Laterality Date  . COLONOSCOPY     With polyp removal   . MANDIBLE FRACTURE SURGERY     Placement of partial jaw due to abcess  . SKIN CANCER EXCISION     Removal of several skin cancers  . TOE SURGERY Right    Removal of toe     Family History:  Family History  Problem Relation Age of Onset  . Emphysema Mother   . Rheumatologic disease Mother   . Heart attack Father   . Emphysema Brother   . Pancreatic cancer Daughter     Social History:   reports that she has been smoking Cigarettes.  She has a 60.00 pack-year smoking history. She has never used smokeless tobacco. She reports that she drinks alcohol. She reports that she does not use drugs.  Additional Social History:  Substance #1 Name of Substance 1: Alcohol-beer 1 - Age of First Use: 20's 1 - Amount (size/oz): 1 beer 1 - Frequency: "Every now and then" 1 - Duration: on-going  1 - Last Use / Amount: 11-22-16  CIWA: CIWA-Ar BP: 148/65 Pulse Rate: 90 COWS:    PATIENT STRENGTHS: (choose at least two) Average or above average intelligence Motivation for treatment/growth  Allergies:  Allergies  Allergen Reactions  . Ceftin [Cefuroxime Axetil] Other (See Comments)    Reaction:  Unknown   . Penicillins Hives and Other (See Comments)    Hives and swelling as a child Has patient had a PCN reaction causing immediate rash, facial/tongue/throat swelling, SOB or lightheadedness with hypotension: Yes Has patient had a PCN reaction causing severe rash involving mucus membranes or skin necrosis: No Has patient had a PCN reaction that required hospitalization No Has patient had a PCN reaction occurring within the last 10 years: No If all of the above answers are "NO", then may proceed with Cephalosporin use.   . Simvastatin Other (See Comments)    Reaction:  Unknown     Home Medications:  (Not in a hospital admission)  OB/GYN Status:  No LMP recorded. Patient is postmenopausal.  General Assessment Data Location of Assessment: [facility] TTS Assessment: In system Is this a Tele or Face-to-Face Assessment?: Face-to-Face Is this an Initial Assessment or a Re-assessment for this encounter?: Initial Assessment Marital status: Divorced Is patient pregnant?: No Pregnancy Status: No Living Arrangements: Alone Can pt return to current living arrangement?: Yes Admission Status: Involuntary Is patient capable of signing voluntary admission?: Yes Referral Source:  Self/Family/Friend Insurance type: Medicare     Crisis Care Plan Living Arrangements: Alone Name of Psychiatrist:  ([facility] Name of Therapist:  (No therapist )  Education Status Is patient currently in school?: No Highest grade of school patient has completed:  (unk) Name of school:  (n/a) Contact person:  (n/a)  Risk to self with the past 6 months Suicidal Ideation: Yes-Currently Present Has patient been a risk to self within the past 6 months prior to admission? : Yes Suicidal Intent: Yes-Currently Present Has patient had any suicidal intent within the past 6 months prior to admission? : Yes Is patient at risk for suicide?: Yes Suicidal Plan?: Yes-Currently Present Has patient had any suicidal plan within the past 6 months prior to admission? : Yes Specify Current Suicidal Plan: "cut my wrist".  Access to Means: Yes Specify Access to Suicidal Means: Access to knives  What has been your use of drugs/alcohol within the last 12 months?: Alcohol use reported previously.  Previous Attempts/Gestures: Yes How many times?: 1 Other Self Harm Risks: Denies  Triggers for Past Attempts: Other (Comment) ("feeling lonesome" ) Intentional Self Injurious Behavior: None Family Suicide History: Yes ("My father had a nervous breakdown") Recent stressful life event(s): Financial Problems Persecutory voices/beliefs?: No Depression: Yes Depression Symptoms: Despondent, Tearfulness, Isolating, Fatigue, Guilt, Loss of interest in usual pleasures, Feeling worthless/self pity, Feeling angry/irritable Substance abuse history and/or treatment for substance abuse?: Yes Suicide prevention information given to non-admitted patients: Not applicable  Risk to Others within the past 6 months Homicidal Ideation: No Does patient have any lifetime risk of violence toward others beyond the six months prior to admission? : No Thoughts of Harm to Others: No Current Homicidal  Intent: No Current Homicidal Plan: No Access to Homicidal Means: No Identified Victim: N/A History of harm to others?: No Assessment of Violence: None Noted Violent Behavior Description: No violent behaviors observed at this time. Pt is calm and cooperative.  Does patient have access to weapons?: No Criminal Charges Pending?: No Does patient have a court date: No Is patient on probation?: No  Psychosis Hallucinations: None noted Delusions: None noted  Mental Status Report Appearance/Hygiene: In scrubs Eye Contact: Good Motor Activity: Freedom of movement Speech: Logical/coherent Level of Consciousness: Alert Mood: Depressed Affect: Appropriate to circumstance Anxiety Level: None Thought Processes: Coherent, Relevant Judgement: Impaired Orientation: Person, Place, Situation, Time  Cognitive Functioning Concentration: Decreased Memory: Recent Intact IQ: Average Insight: Poor Impulse Control: Poor Appetite: Poor Weight Loss: 10 ("over past 6 months". ) Weight Gain: 0 Sleep: No Change Total Hours of Sleep: 8 (with medication ) Vegetative Symptoms: Staying in bed  ADLScreening [facility] Assessment Services) Patient's cognitive ability adequate to safely complete daily activities?: Yes Patient able to express need for assistance with ADLs?: Yes Independently performs ADLs?: Yes (appropriate for developmental age)  Prior Inpatient Therapy Prior Inpatient Therapy: Yes Prior Therapy Dates: 11/23/16 Prior Therapy Facilty/Provider(s): [facility] Reason for Treatment: Suicide attempt/Depression  Prior Outpatient Therapy Prior Outpatient Therapy: Yes Prior Therapy Dates: Current  Prior Therapy Facilty/Provider(s): [facility]  Reason for Treatment: Depression  Does patient have an ACCT team?: No Does patient have Intensive In-House Services?  : No Does patient have [facility] services? : No Does patient have P4CC services?: No  ADL Screening  (condition at time of admission) Patient's cognitive ability adequate to safely complete daily activities?: Yes Is the patient deaf or have difficulty hearing?: No Does the patient have difficulty seeing, even when wearing glasses/contacts?: No Does the patient have difficulty concentrating, remembering, or making decisions?: No Patient able to express need for assistance with ADLs?: Yes Does the patient have difficulty dressing or bathing?: No Independently performs ADLs?: Yes (appropriate for developmental age) Does the patient have difficulty walking or climbing stairs?: No       Abuse/Neglect Assessment (Assessment to be complete while patient is alone) Physical Abuse: Denies Verbal Abuse: Denies Sexual Abuse: Denies Exploitation of patient/patient's resources: Denies Self-Neglect: Denies Values / Beliefs Cultural Requests During Hospitalization: None Spiritual Requests During Hospitalization: None   Advance Directives (For Healthcare) Does Patient Have a Medical Advance Directive?: No Would patient like information on creating a medical advance directive?: No - Patient declined    Additional Information 1:1 In Past 12 Months?: No CIRT Risk: No Elopement Risk: No Does patient have medical clearance?: Yes     Disposition:  Disposition Initial Assessment Completed for this Encounter: Yes Disposition of Patient: Inpatient treatment program Type of inpatient treatment program: Adult  Kyren Vaux S 12/20/2016 8:00 PM

## 2016-12-20 NOTE — BH Assessment (Signed)
Assessment completed. Consulted Lindon Romp, FNP who agrees that pt meets inpatient criteria. TTS to seek placement. Eliezer Mccoy, PA-C has been informed of the recommendation.

## 2016-12-21 DIAGNOSIS — J449 Chronic obstructive pulmonary disease, unspecified: Secondary | ICD-10-CM | POA: Diagnosis not present

## 2016-12-21 DIAGNOSIS — Z808 Family history of malignant neoplasm of other organs or systems: Secondary | ICD-10-CM

## 2016-12-21 DIAGNOSIS — F332 Major depressive disorder, recurrent severe without psychotic features: Secondary | ICD-10-CM | POA: Diagnosis not present

## 2016-12-21 DIAGNOSIS — Z800 Family history of malignant neoplasm of digestive organs: Secondary | ICD-10-CM | POA: Diagnosis not present

## 2016-12-21 DIAGNOSIS — F17210 Nicotine dependence, cigarettes, uncomplicated: Secondary | ICD-10-CM | POA: Diagnosis not present

## 2016-12-21 DIAGNOSIS — I10 Essential (primary) hypertension: Secondary | ICD-10-CM | POA: Diagnosis not present

## 2016-12-21 DIAGNOSIS — R45851 Suicidal ideations: Secondary | ICD-10-CM | POA: Diagnosis present

## 2016-12-21 DIAGNOSIS — Z825 Family history of asthma and other chronic lower respiratory diseases: Secondary | ICD-10-CM | POA: Diagnosis not present

## 2016-12-21 DIAGNOSIS — Z9889 Other specified postprocedural states: Secondary | ICD-10-CM | POA: Diagnosis not present

## 2016-12-21 DIAGNOSIS — Z8489 Family history of other specified conditions: Secondary | ICD-10-CM | POA: Diagnosis not present

## 2016-12-21 DIAGNOSIS — F329 Major depressive disorder, single episode, unspecified: Secondary | ICD-10-CM | POA: Diagnosis present

## 2016-12-21 DIAGNOSIS — E039 Hypothyroidism, unspecified: Secondary | ICD-10-CM | POA: Diagnosis not present

## 2016-12-21 DIAGNOSIS — K219 Gastro-esophageal reflux disease without esophagitis: Secondary | ICD-10-CM | POA: Diagnosis present

## 2016-12-21 DIAGNOSIS — Z7982 Long term (current) use of aspirin: Secondary | ICD-10-CM

## 2016-12-21 DIAGNOSIS — Z8249 Family history of ischemic heart disease and other diseases of the circulatory system: Secondary | ICD-10-CM

## 2016-12-21 DIAGNOSIS — Z79899 Other long term (current) drug therapy: Secondary | ICD-10-CM

## 2016-12-21 DIAGNOSIS — F333 Major depressive disorder, recurrent, severe with psychotic symptoms: Secondary | ICD-10-CM | POA: Diagnosis not present

## 2016-12-21 DIAGNOSIS — F339 Major depressive disorder, recurrent, unspecified: Secondary | ICD-10-CM | POA: Diagnosis present

## 2016-12-21 DIAGNOSIS — Z880 Allergy status to penicillin: Secondary | ICD-10-CM

## 2016-12-21 MED ORDER — VENLAFAXINE HCL ER 75 MG PO CP24: 150.0000 mg | ORAL_CAPSULE | Freq: Every day | ORAL | Status: DC

## 2016-12-21 MED ORDER — TRAZODONE HCL 100 MG PO TABS
100.0000 mg | ORAL_TABLET | Freq: Once | ORAL | Status: AC
Start: 2016-12-21 — End: 2016-12-21
  Administered 2016-12-21: 100 mg via ORAL
  Filled 2016-12-21: qty 1

## 2016-12-21 MED ORDER — QUETIAPINE FUMARATE 50 MG PO TABS: 50.0000 mg | ORAL_TABLET | Freq: Every day | ORAL | Status: DC

## 2016-12-21 MED ORDER — TRAZODONE HCL 100 MG PO TABS: 100.0000 mg | ORAL_TABLET | Freq: Every evening | ORAL | Status: DC | PRN

## 2016-12-21 NOTE — Progress Notes (Signed)
CSW filed patient's first examination paperwork in IVC log book.

## 2016-12-21 NOTE — BHH Counselor (Signed)
Pt accepted to [facility].  Accepting is Dr. Brantley Fling.  Please call report to [phone].

## 2016-12-21 NOTE — ED Notes (Signed)
Attempted to call report to [facility]

## 2016-12-21 NOTE — Progress Notes (Signed)
Patient has been referred to the following geriatric inpatient facilities (accepting referrals today): [facility], [facility], [facility], [facility], [facility], [facility], [place].  [facility], [employer] Disposition staff 12/21/2016 11:10 AM

## 2016-12-21 NOTE — Consult Note (Signed)
[place] Psychiatry Consult   Reason for Consult:  Psychiatric evaluation Referring Physician:  EDP Patient Identification: Kendra Hernandez MRN:  [id number] Principal Diagnosis: Major depressive disorder, recurrent episode [facility]) Diagnosis:   Patient Active Problem List   Diagnosis Date Noted  . Major depressive disorder, recurrent episode ([place]) [F33.9] 12/21/2016    Priority: High  . Major depression, chronic [F32.9] 01/09/2016    Priority: Medium  . Major depressive disorder, recurrent episode, severe ([place]) [F33.2] 11/24/2016  . Alcohol withdrawal ([place]) [F10.239] 07/17/2016  . Withdrawal symptoms, alcohol ([place]) [F10.239] 07/17/2016  . Hypothyroidism [E03.9] 07/17/2016  . History of CVA (cerebrovascular accident) [Z86.73] 07/17/2016  . Head contusion [S00.93XA] 07/17/2016  . Fall [W19.XXXA] 07/17/2016  . Insomnia [G47.00] 07/17/2016  . Essential hypertension [I10] 07/17/2016  . Abnormality of gait [R26.9] 01/24/2016  . Memory loss [R41.3] 01/24/2016  . Acute right MCA stroke ([place]) [I63.511] 01/09/2016  . Essential hypertension, benign [I10] 01/09/2016  . Hyperlipidemia LDL goal <100 [E78.5] 01/09/2016  . Other specified hypothyroidism [E03.8] 01/09/2016  . Cervicalgia [M54.2] 01/09/2016  . Esophageal reflux [K21.9] 01/09/2016    Total Time spent with patient: 45 minutes  Subjective:   Kendra Hernandez is a 75 y.o. female patient admitted with suicidal thoughts with plan.  HPI:  Kendra Hernandez is an 75 y.o. female  with history of Major depressive disorder who was brought to  [facility] for evaluation. Patient was IVC'd by her son after she threatened to commit suicide by cutting her wrist like she did in November 26 th, 2017. Patient reports worsening depressive symptoms characterized by hopelessness, low energy level, lack of motivation and trouble sleeping. Patient reports her recent stressors are the holiday season and feeling lonely at home. Collateral information gathered  from pt's son, Gene Gay Filler revealed that patient had called him several times threatening to kill herself.   Past Psychiatric History: as above  Risk to Self: Suicidal Ideation: Yes-Currently Present Suicidal Intent: Yes-Currently Present Is patient at risk for suicide?: Yes Suicidal Plan?: Yes-Currently Present Specify Current Suicidal Plan: "cut my wrist".  Access to Means: Yes Specify Access to Suicidal Means: Access to knives  What has been your use of drugs/alcohol within the last 12 months?: Alcohol use reported previously.  How many times?: 1 Other Self Harm Risks: Denies  Triggers for Past Attempts: Other (Comment) ("feeling lonesome" ) Intentional Self Injurious Behavior: None Risk to Others: Homicidal Ideation: No Thoughts of Harm to Others: No Current Homicidal Intent: No Current Homicidal Plan: No Access to Homicidal Means: No Identified Victim: N/A History of harm to others?: No Assessment of Violence: None Noted Violent Behavior Description: No violent behaviors observed at this time. Pt is calm and cooperative.  Does patient have access to weapons?: No Criminal Charges Pending?: No Does patient have a court date: No Prior Inpatient Therapy: Prior Inpatient Therapy: Yes Prior Therapy Dates: 11/23/16 Prior Therapy Facilty/Provider(s): [facility] Reason for Treatment: Suicide attempt/Depression  Prior Outpatient Therapy: Prior Outpatient Therapy: Yes Prior Therapy Dates: Current  Prior Therapy Facilty/Provider(s): Dr. Darleene Cleaver- [facility]  Reason for Treatment: Depression  Does patient have an ACCT team?: No Does patient have Intensive In-House Services?  : No Does patient have [facility] services? : No Does patient have P4CC services?: No  Past Medical History:  Past Medical History:  Diagnosis Date  . Anxiety   . Arthritis   . COPD (chronic obstructive pulmonary disease) ([place])   . Hyperlipidemia   . Hypertension   . Hypothyroid   .  Insomnia    . Reflux   . Repeated falls   . Skin cancer   . Stress   . Stroke (cerebrum) [facility])     Past Surgical History:  Procedure Laterality Date  . COLONOSCOPY     With polyp removal   . MANDIBLE FRACTURE SURGERY     Placement of partial jaw due to abcess  . SKIN CANCER EXCISION     Removal of several skin cancers  . TOE SURGERY Right    Removal of toe    Family History:  Family History  Problem Relation Age of Onset  . Emphysema Mother   . Rheumatologic disease Mother   . Heart attack Father   . Emphysema Brother   . Pancreatic cancer Daughter    Family Psychiatric  History:  Social History:  History  Alcohol Use  . 0.0 oz/week    Comment: last drink 2 wks ago 11-05-16, but is uncontrollable.     History  Drug Use No    Social History   Social History  . Marital status: Divorced    Spouse name: N/A  . Number of children: 2  . Years of education: 13   Occupational History  . Retired    Social History Main Topics  . Smoking status: Current Every Day Smoker    Packs/day: 2.00    Years: 30.00    Types: Cigarettes  . Smokeless tobacco: Never Used  . Alcohol use 0.0 oz/week     Comment: last drink 2 wks ago 11-05-16, but is uncontrollable.  . Drug use: No  . Sexual activity: Not on file   Other Topics Concern  . Not on file   Social History Narrative   Right-handed.   Currently in stroke rehab at [facility].     Occasional caffeine use.   Additional Social History:    Allergies:   Allergies  Allergen Reactions  . Ceftin [Cefuroxime Axetil] Other (See Comments)    Reaction:  Unknown   . Penicillins Hives and Other (See Comments)    Hives and swelling as a child Has patient had a PCN reaction causing immediate rash, facial/tongue/throat swelling, SOB or lightheadedness with hypotension: Yes Has patient had a PCN reaction causing severe rash involving mucus membranes or skin necrosis: No Has patient had a PCN reaction that required hospitalization  No Has patient had a PCN reaction occurring within the last 10 years: No If all of the above answers are "NO", then may proceed with Cephalosporin use.   . Simvastatin Other (See Comments)    Reaction:  Unknown     Labs:  Results for orders placed or performed during the hospital encounter of 12/20/16 (from the past 48 hour(s))  Comprehensive metabolic panel     Status: Abnormal   Collection Time: 12/20/16  5:40 PM  Result Value Ref Range   Sodium 138 135 - 145 mmol/L   Potassium 3.1 (L) 3.5 - 5.1 mmol/L   Chloride 105 101 - 111 mmol/L   CO2 20 (L) 22 - 32 mmol/L   Glucose, Bld 161 (H) 65 - 99 mg/dL   BUN 13 6 - 20 mg/dL   Creatinine, Ser 0.48 0.44 - 1.00 mg/dL   Calcium 9.2 8.9 - 10.3 mg/dL   Total Protein 7.4 6.5 - 8.1 g/dL   Albumin 4.5 3.5 - 5.0 g/dL   AST 15 15 - 41 U/L   ALT 13 (L) 14 - 54 U/L   Alkaline Phosphatase 74 38 - 126  U/L   Total Bilirubin 0.6 0.3 - 1.2 mg/dL   GFR calc non Af Amer >60 >60 mL/min   GFR calc Af Amer >60 >60 mL/min    Comment: (NOTE) The eGFR has been calculated using the CKD EPI equation. This calculation has not been validated in all clinical situations. eGFR's persistently <60 mL/min signify possible Chronic Kidney Disease.    Anion gap 13 5 - 15  Ethanol     Status: None   Collection Time: 12/20/16  5:40 PM  Result Value Ref Range   Alcohol, Ethyl (B) <5 <5 mg/dL    Comment:        LOWEST DETECTABLE LIMIT FOR SERUM ALCOHOL IS 5 mg/dL FOR MEDICAL PURPOSES ONLY   Salicylate level     Status: None   Collection Time: 12/20/16  5:40 PM  Result Value Ref Range   Salicylate Lvl 48.1 2.8 - 30.0 mg/dL  Acetaminophen level     Status: Abnormal   Collection Time: 12/20/16  5:40 PM  Result Value Ref Range   Acetaminophen (Tylenol), Serum 42 (H) 10 - 30 ug/mL    Comment:        THERAPEUTIC CONCENTRATIONS VARY SIGNIFICANTLY. A RANGE OF 10-30 ug/mL MAY BE AN EFFECTIVE CONCENTRATION FOR MANY PATIENTS. HOWEVER, SOME ARE BEST TREATED AT  CONCENTRATIONS OUTSIDE THIS RANGE. ACETAMINOPHEN CONCENTRATIONS >150 ug/mL AT 4 HOURS AFTER INGESTION AND >50 ug/mL AT 12 HOURS AFTER INGESTION ARE OFTEN ASSOCIATED WITH TOXIC REACTIONS.   cbc     Status: None   Collection Time: 12/20/16  5:40 PM  Result Value Ref Range   WBC 6.3 4.0 - 10.5 K/uL   RBC 4.25 3.87 - 5.11 MIL/uL   Hemoglobin 13.7 12.0 - 15.0 g/dL   HCT 39.2 36.0 - 46.0 %   MCV 92.2 78.0 - 100.0 fL   MCH 32.2 26.0 - 34.0 pg   MCHC 34.9 30.0 - 36.0 g/dL   RDW 13.7 11.5 - 15.5 %   Platelets 219 150 - 400 K/uL  Rapid urine drug screen (hospital performed)     Status: None   Collection Time: 12/20/16  8:30 PM  Result Value Ref Range   Opiates NONE DETECTED NONE DETECTED   Cocaine NONE DETECTED NONE DETECTED   Benzodiazepines NONE DETECTED NONE DETECTED   Amphetamines NONE DETECTED NONE DETECTED   Tetrahydrocannabinol NONE DETECTED NONE DETECTED   Barbiturates NONE DETECTED NONE DETECTED    Comment:        DRUG SCREEN FOR MEDICAL PURPOSES ONLY.  IF CONFIRMATION IS NEEDED FOR ANY PURPOSE, NOTIFY LAB WITHIN 5 DAYS.        LOWEST DETECTABLE LIMITS FOR URINE DRUG SCREEN Drug Class       Cutoff (ng/mL) Amphetamine      1000 Barbiturate      200 Benzodiazepine   859 Tricyclics       093 Opiates          300 Cocaine          300 THC              50     Current Facility-Administered Medications  Medication Dose Route Frequency Provider Last Rate Last Dose  . amLODipine (NORVASC) tablet 10 mg  10 mg Oral Daily Alexandra M Law, PA-C   10 mg at 12/21/16 0940  . aspirin EC tablet 81 mg  81 mg Oral Daily Alexandra M Law, PA-C   81 mg at 12/21/16 0941  . atorvastatin (LIPITOR) tablet 20  mg  20 mg Oral Daily Alexandra M Law, PA-C      . folic acid (FOLVITE) tablet 1 mg  1 mg Oral Daily Alexandra M Law, PA-C   1 mg at 12/21/16 0940  . hydrALAZINE (APRESOLINE) tablet 50 mg  50 mg Oral BID Alexandra M Law, PA-C   50 mg at 12/21/16 0940  . ibuprofen (ADVIL,MOTRIN) tablet  600 mg  600 mg Oral Q6H PRN Fatima Blank, MD   600 mg at 12/21/16 [phone]  . labetalol (NORMODYNE) tablet 200 mg  200 mg Oral BID Alexandra M Law, PA-C   200 mg at 12/21/16 0940  . levothyroxine (SYNTHROID, LEVOTHROID) tablet 50 mcg  50 mcg Oral QAC breakfast Alexandra M Law, PA-C   50 mcg at 12/21/16 0827  . loratadine (CLARITIN) tablet 10 mg  10 mg Oral Daily Alexandra M Law, PA-C   10 mg at 12/21/16 0941  . pantoprazole (PROTONIX) EC tablet 40 mg  40 mg Oral Daily Alexandra M Law, PA-C   40 mg at 12/21/16 3893  . QUEtiapine (SEROQUEL) tablet 50 mg  50 mg Oral QHS Deyonna Fitzsimmons, MD      . sucralfate (CARAFATE) tablet 1 g  1 g Oral TID WC & HS Alexandra M Law, PA-C   1 g at 12/21/16 1131  . traZODone (DESYREL) tablet 100 mg  100 mg Oral QHS PRN Corena Pilgrim, MD      . Derrill Memo ON 12/22/2016] venlafaxine XR (EFFEXOR-XR) 24 hr capsule 150 mg  150 mg Oral Q breakfast Corena Pilgrim, MD       Current Outpatient Prescriptions  Medication Sig Dispense Refill  . acetaminophen (TYLENOL) 500 MG tablet Take 1,000 mg by mouth every 6 (six) hours as needed for mild pain, moderate pain, fever or headache.     Marland Kitchen amLODipine (NORVASC) 10 MG tablet Take 10 mg by mouth daily.    Marland Kitchen aspirin EC 81 MG tablet Take 81 mg by mouth daily.     Marland Kitchen atorvastatin (LIPITOR) 20 MG tablet Take 20 mg by mouth daily.    . folic acid (FOLVITE) 1 MG tablet Take 1 tablet (1 mg total) by mouth daily. 30 tablet 1  . hydrALAZINE (APRESOLINE) 50 MG tablet Take 50 mg by mouth 2 (two) times daily.    Marland Kitchen labetalol (NORMODYNE) 200 MG tablet Take 200 mg by mouth 2 (two) times daily.     Marland Kitchen levothyroxine (SYNTHROID, LEVOTHROID) 50 MCG tablet Take 50 mcg by mouth daily before breakfast.    . loratadine (CLARITIN) 10 MG tablet Take 10 mg by mouth daily.    . Menthol, Topical Analgesic, (BIOFREEZE EX) Apply 1 application topically as needed (for muscle pain). Pt applies to neck and shoulders.    Marland Kitchen omeprazole (PRILOSEC) 40 MG capsule Take  40 mg by mouth daily.    . QUEtiapine (SEROQUEL) 25 MG tablet Take 25 mg by mouth at bedtime.    . sucralfate (CARAFATE) 1 g tablet Take 1 g by mouth 4 (four) times daily -  with meals and at bedtime.    . traZODone (DESYREL) 100 MG tablet Take 200 mg by mouth at bedtime.     Marland Kitchen venlafaxine (EFFEXOR) 100 MG tablet Take 100 mg by mouth 2 (two) times daily. Pt takes with a 66m tablet.    . venlafaxine (EFFEXOR) 75 MG tablet Take 75 mg by mouth 2 (two) times daily. Pt takes with a 101mtablet.      Musculoskeletal: Strength & Muscle  Tone: within normal limits Gait & Station: normal Patient leans: N/A  Psychiatric Specialty Exam: Physical Exam  Psychiatric: Her speech is normal. She is withdrawn. She expresses impulsivity. She exhibits a depressed mood. She expresses suicidal ideation. She expresses suicidal plans. She exhibits abnormal recent memory.    Review of Systems  Constitutional: Negative.   HENT: Negative.   Eyes: Negative.   Respiratory: Negative.   Cardiovascular: Negative.   Gastrointestinal: Negative.   Genitourinary: Negative.   Musculoskeletal: Negative.   Skin: Negative.   Neurological: Negative.   Endo/Heme/Allergies: Negative.   Psychiatric/Behavioral: Positive for depression and suicidal ideas. The patient is nervous/anxious.     Blood pressure 133/68, pulse 80, temperature 97.8 F (36.6 C), temperature source Oral, resp. rate 16, SpO2 94 %.There is no height or weight on file to calculate BMI.  General Appearance: Casual  Eye Contact:  Good  Speech:  Clear and Coherent  Volume:  Decreased  Mood:  Depressed and Hopeless  Affect:  Constricted  Thought Process:  Coherent and Descriptions of Associations: Intact  Orientation:  Full (Time, Place, and Person)  Thought Content:  Logical  Suicidal Thoughts:  Yes.  with intent/plan  Homicidal Thoughts:  No  Memory:  Immediate;   Good Recent;   Fair Remote;   Good  Judgement:  Poor  Insight:  Shallow   Psychomotor Activity:  Decreased and Psychomotor Retardation  Concentration:  Concentration: Fair and Attention Span: Fair  Recall:  [employer] of Knowledge:  Fair  Language:  Good  Akathisia:  No  Handed:  Right  AIMS (if indicated):     Assets:  Communication Skills Social Support Others:  family  ADL's:  Impaired  Cognition:  Impaired,  Mild  Sleep:   poor     Treatment Plan Summary: Daily contact with patient to assess and evaluate symptoms and progress in treatment and Medication management  Change Effexor to Effexor XR 150 mg daily for depression Increase Seroquel to 50 mg Qhs for sleep/depression.  Disposition: Recommend psychiatric Inpatient admission when medically cleared.  Will benefit from Geropsychiatric inpatient admission for stabilization.  Corena Pilgrim, MD 12/21/2016 12:22 PM

## 2017-01-02 DIAGNOSIS — M79672 Pain in left foot: Secondary | ICD-10-CM | POA: Diagnosis not present

## 2017-01-02 DIAGNOSIS — M2042 Other hammer toe(s) (acquired), left foot: Secondary | ICD-10-CM | POA: Diagnosis not present

## 2017-01-03 DIAGNOSIS — F322 Major depressive disorder, single episode, severe without psychotic features: Secondary | ICD-10-CM | POA: Diagnosis not present

## 2017-02-11 DIAGNOSIS — H35342 Macular cyst, hole, or pseudohole, left eye: Secondary | ICD-10-CM | POA: Diagnosis not present

## 2017-02-11 DIAGNOSIS — H26493 Other secondary cataract, bilateral: Secondary | ICD-10-CM | POA: Diagnosis not present

## 2017-02-11 DIAGNOSIS — H04123 Dry eye syndrome of bilateral lacrimal glands: Secondary | ICD-10-CM | POA: Diagnosis not present

## 2017-02-11 DIAGNOSIS — H52203 Unspecified astigmatism, bilateral: Secondary | ICD-10-CM | POA: Diagnosis not present

## 2017-03-06 DIAGNOSIS — H26492 Other secondary cataract, left eye: Secondary | ICD-10-CM | POA: Diagnosis not present

## 2017-03-10 DIAGNOSIS — W010XXA Fall on same level from slipping, tripping and stumbling without subsequent striking against object, initial encounter: Secondary | ICD-10-CM | POA: Diagnosis not present

## 2017-03-10 DIAGNOSIS — H26491 Other secondary cataract, right eye: Secondary | ICD-10-CM | POA: Diagnosis not present

## 2017-03-10 DIAGNOSIS — H35342 Macular cyst, hole, or pseudohole, left eye: Secondary | ICD-10-CM | POA: Diagnosis not present

## 2017-03-10 DIAGNOSIS — H43812 Vitreous degeneration, left eye: Secondary | ICD-10-CM | POA: Diagnosis not present

## 2017-03-13 DIAGNOSIS — E7800 Pure hypercholesterolemia, unspecified: Secondary | ICD-10-CM | POA: Diagnosis not present

## 2017-03-13 DIAGNOSIS — E039 Hypothyroidism, unspecified: Secondary | ICD-10-CM | POA: Diagnosis not present

## 2017-03-13 DIAGNOSIS — F322 Major depressive disorder, single episode, severe without psychotic features: Secondary | ICD-10-CM | POA: Diagnosis not present

## 2017-03-13 DIAGNOSIS — F17200 Nicotine dependence, unspecified, uncomplicated: Secondary | ICD-10-CM | POA: Diagnosis not present

## 2017-03-13 DIAGNOSIS — I639 Cerebral infarction, unspecified: Secondary | ICD-10-CM | POA: Diagnosis not present

## 2017-03-13 DIAGNOSIS — I10 Essential (primary) hypertension: Secondary | ICD-10-CM | POA: Diagnosis not present

## 2017-03-19 DIAGNOSIS — H35342 Macular cyst, hole, or pseudohole, left eye: Secondary | ICD-10-CM | POA: Diagnosis not present

## 2017-04-10 DIAGNOSIS — E039 Hypothyroidism, unspecified: Secondary | ICD-10-CM | POA: Diagnosis not present

## 2017-04-11 IMAGING — DX DG CHEST 2V
2 series · 2 of 2 positions shown · non-contrast
Comparison: 11/15/2010

CLINICAL DATA: Chest pain, bruising.  Fell a few weeks ago.

EXAM:
CHEST  2 VIEW

[chest lat]
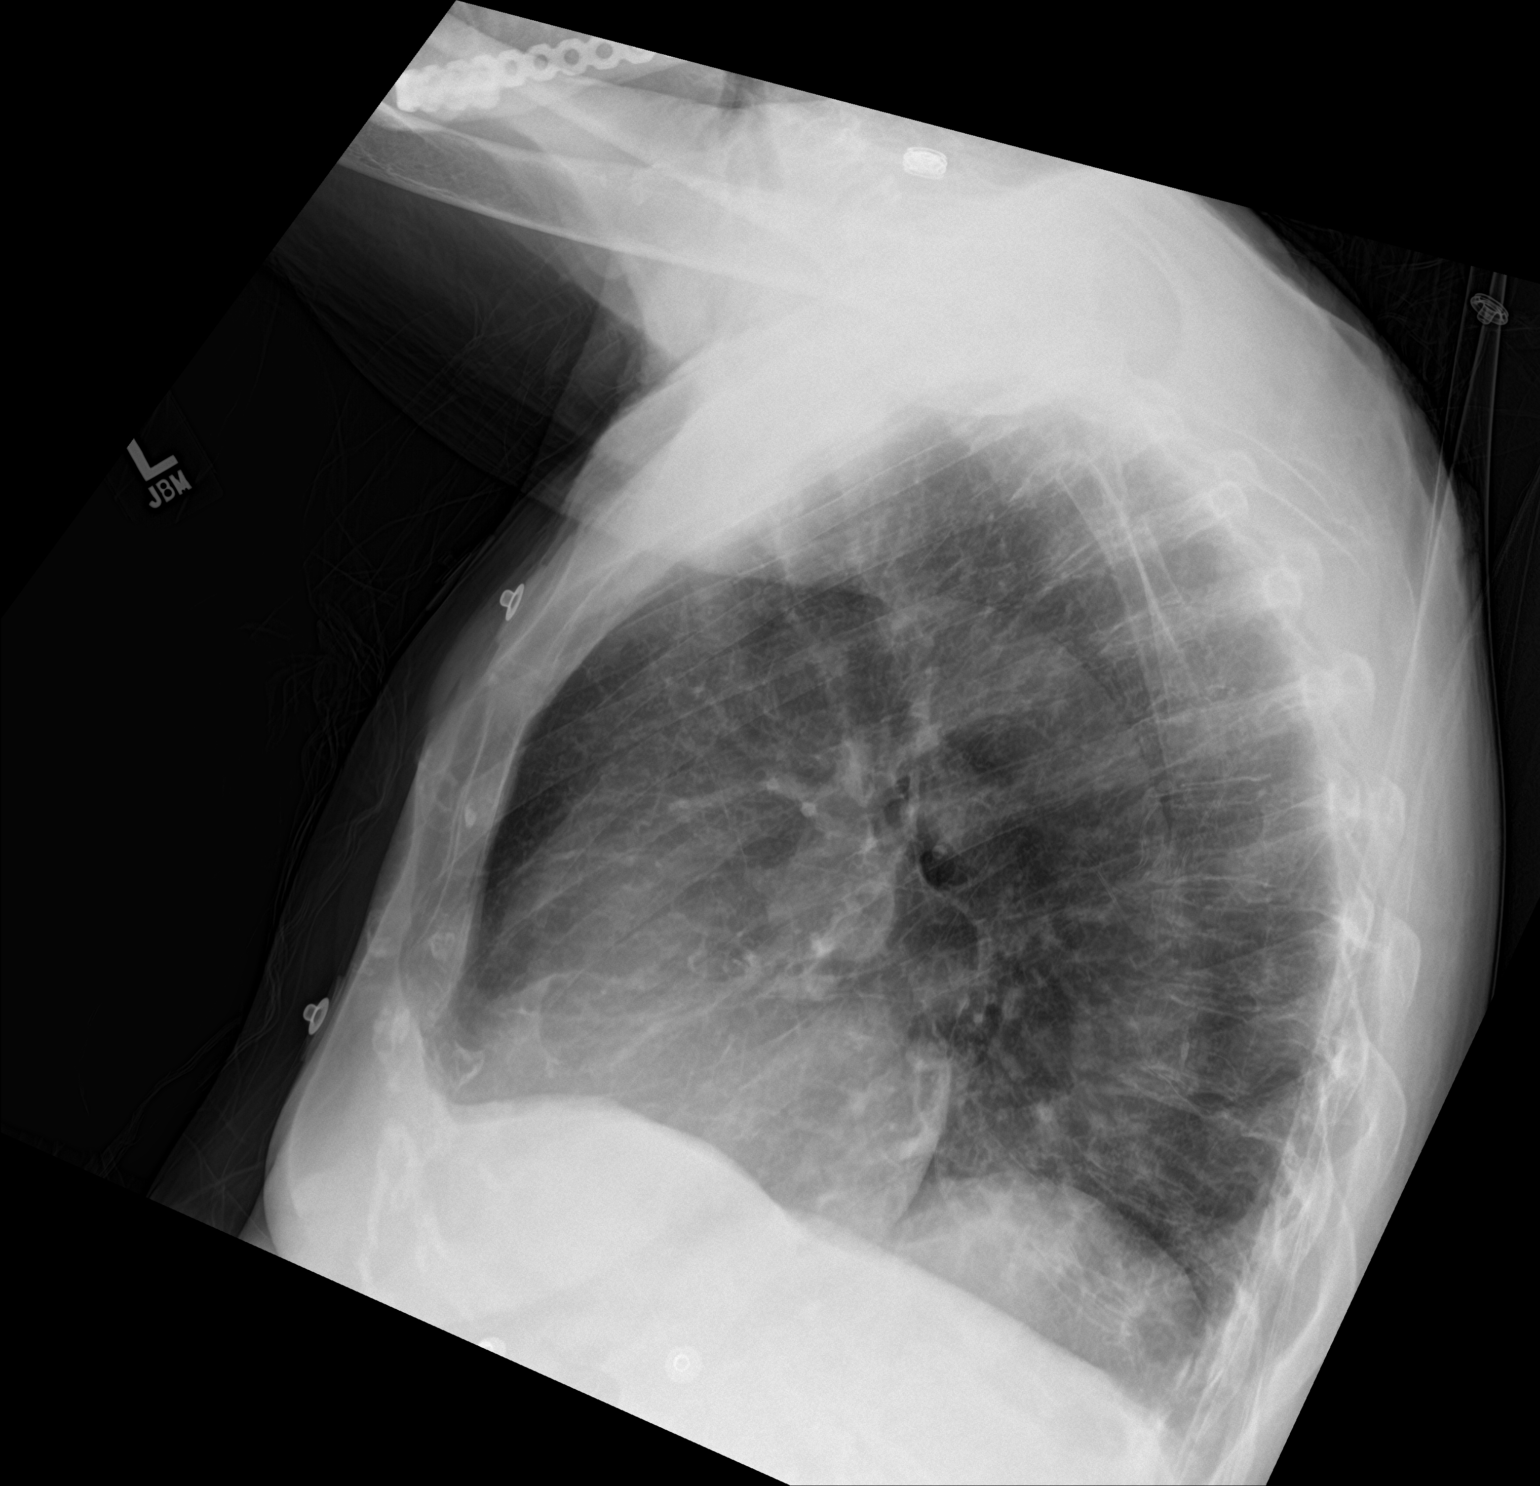

[chest ap]
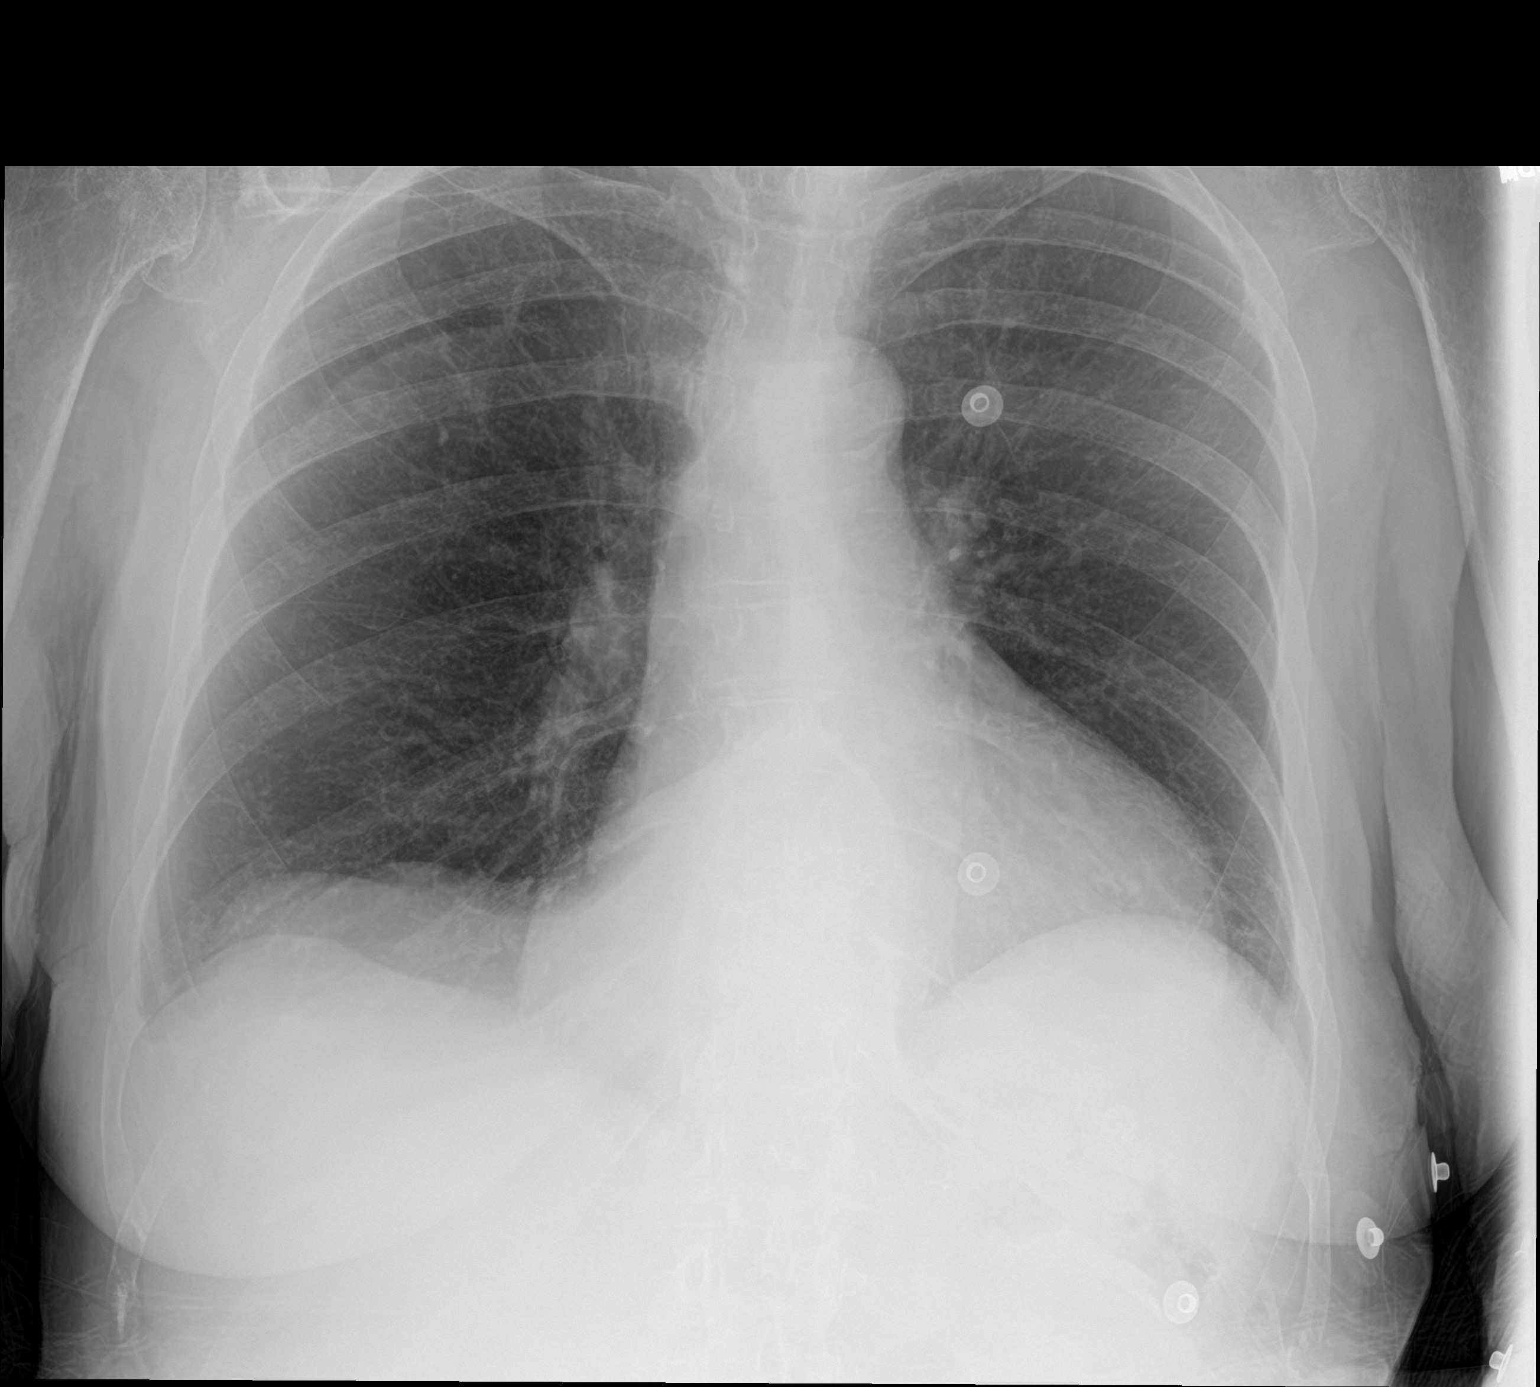

[2 of 2 positions shown; findings below may reference images not displayed]

FINDINGS: Mild cardiomegaly. Moderate-sized hiatal hernia. No confluent
airspace opacities or effusions. No visible rib fracture. No
pneumothorax.
IMPRESSION: Cardiomegaly.  Hiatal hernia.  No active disease.

## 2017-04-25 DIAGNOSIS — J069 Acute upper respiratory infection, unspecified: Secondary | ICD-10-CM | POA: Diagnosis not present

## 2017-05-08 ENCOUNTER — Ambulatory Visit (INDEPENDENT_AMBULATORY_CARE_PROVIDER_SITE_OTHER): Payer: Medicare Other | Admitting: Nurse Practitioner

## 2017-05-08 ENCOUNTER — Encounter: Payer: Self-pay | Admitting: Nurse Practitioner

## 2017-05-08 ENCOUNTER — Encounter (INDEPENDENT_AMBULATORY_CARE_PROVIDER_SITE_OTHER): Payer: Self-pay

## 2017-05-08 VITALS — BP 152/86 | HR 72 | Ht 64.0 in | Wt 111.8 lb

## 2017-05-08 DIAGNOSIS — I63511 Cerebral infarction due to unspecified occlusion or stenosis of right middle cerebral artery: Secondary | ICD-10-CM

## 2017-05-08 DIAGNOSIS — I10 Essential (primary) hypertension: Secondary | ICD-10-CM | POA: Diagnosis not present

## 2017-05-08 DIAGNOSIS — R269 Unspecified abnormalities of gait and mobility: Secondary | ICD-10-CM

## 2017-05-08 DIAGNOSIS — E785 Hyperlipidemia, unspecified: Secondary | ICD-10-CM

## 2017-05-08 NOTE — Progress Notes (Signed)
[facility]  PATIENT: Kendra Hernandez DOB: 1941-12-10   REASON FOR VISIT: Follow-up for CVA, memory loss, gait abnormality HISTORY FROM: Patient and caregiver    HISTORY OF PRESENT ILLNESS:YYSandra BoyetteIs a 76 years old right-handed female,accompanied by her son Kendra Hernandez, seen in refer by Dr. Delfina Redwood for evaluation of stroke in Jan 2017.   I reviewed and summarized per hospital record from [facility] at [facility],   She had past medical history of hypertension, anxiety, hyperlipidemia, hypothyroidism, COPD, used to be a heavy smoker, and heavy drinker, was admitted to local hospital in January 01 2016 for acute onset of weakness, falling which happened in December 31st 2016. patient is a poor historian, also hard of hearing,,   She lives alone at [facility], prior to the stroke, she smoke at least 1 pack daily, independent of living, still drives, no significant memory trouble, since stroke, she was noted to have increased memory trouble, tends to repeat herself, she continue has mild left-sided weakness, she had a gradual onset gait difficulty for many years, acute worsening since her stroke, she denies bowel and bladder incontinence, she currently lives at [facility], will be discharged to her own apartment soon.   I have reviewed MRI of the brain, showed acute right MCA infarction, per description, involving right parietal, temporal, insular, mild supratentorium small vessel disease   Laboratory evaluation showed normal CBC with hemoglobin of 13 point 6, normal CMP, with creatinine of 0.6 2, GFR more than 90, lipid profile showed elevated cholesterol 279, LDL 201,  Ultrasound of carotid artery showed no evidence of large vessel bilateral internal carotid stenosis  She has a history of chronic neck pain, diffuse body achy pain, fibromyalgia, has a tendency to over medicate herself with pain medications.    UPDATE June 26th 2017:YY She is better, she walks well but still with left-sided weakness, she can cook, she has difficulty keeping finance, enhanced overspent constantly, she is no longer driving,  She goes over her budget every week,  She does not sleep well, no eating well.  She has no appetite, she still smoke, at least 1ppd, she does drink hard liquor, 40 oz hard liquor a day, bing drink sometimes, she often call taxi to go to liquor store.  UPDATE 11/07/2017CM Hernandez, 76 year old female returns for follow-up. She has a history of acute right MCA infarction. She is currently on aspirin without recurrent TIA or stroke symptoms. She has not had any bleeding or bruising. She returns with her son today. She claims she has not had any hard liquor in over 2 weeks she still smokes. She has a caregiver 3 hours a day Monday through Friday to assist with errands,  laundry housekeeping and cooking. No recent falls. Son is her power of attorney and does her finances. She continues to live in an apartment setting. She does not drive. She has an appointment with a counselor this week for an initial visit. She returns for reevaluation UPDATE 5/10/ 2018CM Kendra Hernandez, 76 year old female returns for follow-up with history of right MCA infarction. She is currently on aspirin for secondary stroke prevention. She has not had further stroke or TIA symptoms She returns with her caregiver who is with her 3 hours 5 days a week. She supervises her medications takes her an walks and does some cooking and transports her to appointments . She continues to live in her own apartment she does not drive she continues to smoke a pack a  day. She was seen in the emergency room in November for major depression and in December for suicidal ideations. She was seeing a counselor but says she has stopped she remains on Lipitor without complaints of myalgias.She claims she has stopped alcohol . She returns for reevaluation  REVIEW OF  SYSTEMS: Full 14 system review of systems performed and notable only for those listed, all others are neg:  Constitutional: neg  Cardiovascular: neg Ear/Nose/Throat: neg  Skin: neg Eyes: Hearing loss Respiratory: neg Gastroitestinal: neg Hematology/Lymphatic: neg  Endocrine: neg Musculoskeletal: Joint pain neck pain fibromyalgia  Allergy/Immunology: neg Neurological: neg Psychiatric: Depression and anxiety Sleep : Insomnia ALLERGIES: Allergies  Allergen Reactions  . Ceftin [Cefuroxime Axetil] Other (See Comments)    Reaction:  Unknown   . Penicillins Hives and Other (See Comments)    Hives and swelling as a child Has patient had a PCN reaction causing immediate rash, facial/tongue/throat swelling, SOB or lightheadedness with hypotension: Yes Has patient had a PCN reaction causing severe rash involving mucus membranes or skin necrosis: No Has patient had a PCN reaction that required hospitalization No Has patient had a PCN reaction occurring within the last 10 years: No If all of the above answers are "NO", then may proceed with Cephalosporin use.   . Simvastatin Other (See Comments)    Reaction:  Unknown     HOME MEDICATIONS: Outpatient Medications Prior to Visit  Medication Sig Dispense Refill  . acetaminophen (TYLENOL) 500 MG tablet Take 1,000 mg by mouth every 6 (six) hours as needed for mild pain, moderate pain, fever or headache.     Marland Kitchen amLODipine (NORVASC) 10 MG tablet Take 10 mg by mouth daily.    Marland Kitchen aspirin EC 81 MG tablet Take 81 mg by mouth daily.     Marland Kitchen atorvastatin (LIPITOR) 20 MG tablet Take 20 mg by mouth daily.    . folic acid (FOLVITE) 1 MG tablet Take 1 tablet (1 mg total) by mouth daily. 30 tablet 1  . hydrALAZINE (APRESOLINE) 50 MG tablet Take 50 mg by mouth 2 (two) times daily.    Marland Kitchen labetalol (NORMODYNE) 200 MG tablet Take 200 mg by mouth 2 (two) times daily.     Marland Kitchen levothyroxine (SYNTHROID, LEVOTHROID) 50 MCG tablet Take 50 mcg by mouth daily before  breakfast.    . loratadine (CLARITIN) 10 MG tablet Take 10 mg by mouth daily.    . Menthol, Topical Analgesic, (BIOFREEZE EX) Apply 1 application topically as needed (for muscle pain). Pt applies to neck and shoulders.    Marland Kitchen omeprazole (PRILOSEC) 40 MG capsule Take 40 mg by mouth daily.    . QUEtiapine (SEROQUEL) 25 MG tablet Take 25 mg by mouth at bedtime.    . sucralfate (CARAFATE) 1 g tablet Take 1 g by mouth 4 (four) times daily -  with meals and at bedtime.    . traZODone (DESYREL) 100 MG tablet Take 200 mg by mouth at bedtime.     Marland Kitchen venlafaxine (EFFEXOR) 100 MG tablet Take 100 mg by mouth 2 (two) times daily. Pt takes with a 75mg  tablet.    . venlafaxine (EFFEXOR) 75 MG tablet Take 75 mg by mouth 2 (two) times daily. Pt takes with a 100mg  tablet.     No facility-administered medications prior to visit.     PAST MEDICAL HISTORY: Past Medical History:  Diagnosis Date  . Anxiety   . Arthritis   . COPD (chronic obstructive pulmonary disease) ([place])   . Hyperlipidemia   .  Hypertension   . Hypothyroid   . Insomnia   . Reflux   . Repeated falls   . Skin cancer   . Stress   . Stroke (cerebrum) ([place])     PAST SURGICAL HISTORY: Past Surgical History:  Procedure Laterality Date  . COLONOSCOPY     With polyp removal   . MANDIBLE FRACTURE SURGERY     Placement of partial jaw due to abcess  . SKIN CANCER EXCISION     Removal of several skin cancers  . TOE SURGERY Right    Removal of toe     FAMILY HISTORY: Family History  Problem Relation Age of Onset  . Emphysema Mother   . Rheumatologic disease Mother   . Heart attack Father   . Emphysema Brother   . Pancreatic cancer Daughter     SOCIAL HISTORY: Social History   Social History  . Marital status: Divorced    Spouse name: N/A  . Number of children: 2  . Years of education: 13   Occupational History  . Retired    Social History Main Topics  . Smoking status: Current Every Day Smoker    Packs/day: 2.00     Years: 30.00    Types: Cigarettes  . Smokeless tobacco: Never Used  . Alcohol use 0.0 oz/week     Comment: last drink 2 wks ago 11-05-16, but is uncontrollable.  . Drug use: No  . Sexual activity: Not on file   Other Topics Concern  . Not on file   Social History Narrative   Right-handed.   Currently in stroke rehab at [facility].     Occasional caffeine use.     PHYSICAL EXAM  Vitals:   05/08/17 0954  BP: (!) 152/86  Pulse: 72  Weight: 111 lb 12.8 oz (50.7 kg)  Height: 5\' 4"  (1.626 m)   Body mass index is 19.19 kg/m.  Generalized: Well developed, in no acute distress , well-groomed Head: normocephalic and atraumatic,. Oropharynx benign  Neck: Supple, no carotid bruits  Cardiac: Regular rate rhythm, no murmur  Musculoskeletal: No deformity   Neurological examination   Mentation: Alert oriented to time, place, history taking.  MMSE - Mini Mental State Exam 05/08/2017 11/05/2016 06/24/2016  Orientation to time 5 4 5   Orientation to Place 5 4 5   Registration 3 3 3   Attention/ Calculation 5 5 5   Recall 2 3 2   Language- name 2 objects 2 2 2   Language- repeat 1 1 1   Language- follow 3 step command 3 3 3   Language- read & follow direction 1 1 1   Write a sentence 1 1 1   Copy design 1 1 1   Total score 29 28 29    Attention span and concentration appropriate. .  Follows all commands speech and language fluent.   Cranial nerve II-XII: .Pupils were equal round reactive to light extraocular movements were full, visual field were full on confrontational test. Facial sensation and strength were normal. Hard of hearing . Uvula tongue midline. head turning and shoulder shrug were normal and symmetric.Tongue protrusion into cheek strength was normal. Motor: Mild left upper extremity proximal and distal weakness and mild left lower extremity proximal and distal weakness  Sensory: normal and symmetric to light touch, pinprick, and  Vibration, in the upper and lower  extremities Coordination: finger-nose-finger, heel-to-shin bilaterally, no dysmetria Reflexes:  Symmetric upper and lower , plantar responses were flexor bilaterally. Gait and Station: Rising up from seated position with pushoff , mildly unsteady stiff  gait no assistive device. Unable to tandem   DIAGNOSTIC DATA (LABS, IMAGING, TESTING) - I reviewed patient records, labs, notes, testing and imaging myself where available.  Lab Results  Component Value Date   WBC 6.3 12/20/2016   HGB 13.7 12/20/2016   HCT 39.2 12/20/2016   MCV 92.2 12/20/2016   PLT 219 12/20/2016      Component Value Date/Time   NA 138 12/20/2016 1740   NA 139 01/02/2016   K 3.1 (L) 12/20/2016 1740   CL 105 12/20/2016 1740   CO2 20 (L) 12/20/2016 1740   GLUCOSE 161 (H) 12/20/2016 1740   BUN 13 12/20/2016 1740   BUN 12 01/02/2016   CREATININE 0.48 12/20/2016 1740   CALCIUM 9.2 12/20/2016 1740   PROT 7.4 12/20/2016 1740   ALBUMIN 4.5 12/20/2016 1740   AST 15 12/20/2016 1740   ALT 13 (L) 12/20/2016 1740   ALKPHOS 74 12/20/2016 1740   BILITOT 0.6 12/20/2016 1740   GFRNONAA >60 12/20/2016 1740   GFRAA >60 12/20/2016 1740   Lab Results  Component Value Date   CHOL 279 (A) 01/02/2016   HDL 49 01/02/2016   LDLCALC 201 01/02/2016   TRIG 149 01/02/2016   Lab Results  Component Value Date   HGBA1C 5.1 01/02/2016   No results found for: VITAMINB12 Lab Results  Component Value Date   TSH 0.707 11/23/2016      ASSESSMENT AND PLAN  76 y.o. year old female  has a past medical history Right MCA stroke with mild residual left hemiparesis, memory loss ,  2 recent admissions for depression and suicidal ideations. She has stopped seeing her counselor . She denies any suicidal ideations today  PLAN: Continue Aspirin for secondary stroke prevention Exercise by walking HEP per therapy Keep B/P less than 671 systolic todays reading 245/80, continue B/P meds Continue Cholesterol medication atorvastin labs  followed by PCP. Stop smoking  Memory  score is stable Patient receives her pain medications from Dr. Delfina Redwood  Follow up in 6 months, next with Krista Blue I spent 25 minutes in total face to face time with the patient more than 50% of which was spent counseling and coordination of care, reviewing test results reviewing medications and discussing and reviewing the diagnosis of stroke, management of risk factors, her memory loss which is stable and importance of following up with her counselor for long history of depression  Dennie Bible, [facility], [facility], [facility] [address] [place], [place] [address] [phone]

## 2017-05-08 NOTE — Patient Instructions (Signed)
Continue Aspirin for secondary stroke prevention Exercise by walking HEP per therapy Keep B/P less than 878 systolic todays reading 676/72, continue B/P meds Continue Cholesterol medication atorvastin labs followed by PCP. Stop smoking  Memory score is stable Follow up in 6 months, next with Krista Blue

## 2017-05-12 NOTE — Progress Notes (Signed)
I have reviewed and agreed above plan. 

## 2017-05-29 DIAGNOSIS — E039 Hypothyroidism, unspecified: Secondary | ICD-10-CM | POA: Diagnosis not present

## 2017-06-18 ENCOUNTER — Other Ambulatory Visit: Payer: Self-pay | Admitting: Internal Medicine

## 2017-06-18 ENCOUNTER — Ambulatory Visit
Admission: RE | Admit: 2017-06-18 | Discharge: 2017-06-18 | Disposition: A | Discharge status: Home / Self Care | Payer: Medicare Other | Source: Ambulatory Visit | Attending: Internal Medicine | Admitting: Internal Medicine

## 2017-06-18 DIAGNOSIS — R0789 Other chest pain: Secondary | ICD-10-CM

## 2017-06-18 DIAGNOSIS — M542 Cervicalgia: Secondary | ICD-10-CM

## 2017-06-18 DIAGNOSIS — S2232XA Fracture of one rib, left side, initial encounter for closed fracture: Secondary | ICD-10-CM | POA: Diagnosis not present

## 2017-06-26 ENCOUNTER — Encounter (HOSPITAL_COMMUNITY): Payer: Self-pay | Admitting: Emergency Medicine

## 2017-06-26 ENCOUNTER — Emergency Department (HOSPITAL_COMMUNITY)
Admission: EM | Admit: 2017-06-26 | Discharge: 2017-06-27 | Disposition: A | Discharge status: Home / Self Care | Payer: Medicare Other | Attending: Emergency Medicine | Admitting: Emergency Medicine

## 2017-06-26 DIAGNOSIS — Z79899 Other long term (current) drug therapy: Secondary | ICD-10-CM | POA: Diagnosis not present

## 2017-06-26 DIAGNOSIS — T512X1A Toxic effect of 2-Propanol, accidental (unintentional), initial encounter: Secondary | ICD-10-CM | POA: Diagnosis not present

## 2017-06-26 DIAGNOSIS — I10 Essential (primary) hypertension: Secondary | ICD-10-CM | POA: Diagnosis not present

## 2017-06-26 DIAGNOSIS — Z7982 Long term (current) use of aspirin: Secondary | ICD-10-CM | POA: Insufficient documentation

## 2017-06-26 DIAGNOSIS — R45851 Suicidal ideations: Secondary | ICD-10-CM | POA: Insufficient documentation

## 2017-06-26 DIAGNOSIS — J449 Chronic obstructive pulmonary disease, unspecified: Secondary | ICD-10-CM | POA: Insufficient documentation

## 2017-06-26 DIAGNOSIS — F1910 Other psychoactive substance abuse, uncomplicated: Secondary | ICD-10-CM | POA: Diagnosis not present

## 2017-06-26 DIAGNOSIS — F1020 Alcohol dependence, uncomplicated: Secondary | ICD-10-CM | POA: Insufficient documentation

## 2017-06-26 DIAGNOSIS — Z85828 Personal history of other malignant neoplasm of skin: Secondary | ICD-10-CM | POA: Diagnosis not present

## 2017-06-26 DIAGNOSIS — E039 Hypothyroidism, unspecified: Secondary | ICD-10-CM | POA: Diagnosis not present

## 2017-06-26 DIAGNOSIS — F17210 Nicotine dependence, cigarettes, uncomplicated: Secondary | ICD-10-CM | POA: Insufficient documentation

## 2017-06-26 DIAGNOSIS — F10929 Alcohol use, unspecified with intoxication, unspecified: Secondary | ICD-10-CM | POA: Diagnosis present

## 2017-06-26 DIAGNOSIS — Z046 Encounter for general psychiatric examination, requested by authority: Secondary | ICD-10-CM | POA: Diagnosis not present

## 2017-06-26 LAB — RAPID URINE DRUG SCREEN, HOSP PERFORMED
Amphetamines: NOT DETECTED
BARBITURATES: NOT DETECTED
BENZODIAZEPINES: NOT DETECTED
Cocaine: NOT DETECTED
Opiates: NOT DETECTED
Tetrahydrocannabinol: NOT DETECTED

## 2017-06-26 LAB — COMPREHENSIVE METABOLIC PANEL
ALBUMIN: 3.9 g/dL (ref 3.5–5.0)
ALK PHOS: 73 U/L (ref 38–126)
ALT: 11 U/L — ABNORMAL LOW (ref 14–54)
AST: 15 U/L (ref 15–41)
Anion gap: 9 (ref 5–15)
BILIRUBIN TOTAL: 0.4 mg/dL (ref 0.3–1.2)
BUN: 9 mg/dL (ref 6–20)
CO2: 23 mmol/L (ref 22–32)
Calcium: 9 mg/dL (ref 8.9–10.3)
Chloride: 109 mmol/L (ref 101–111)
Creatinine, Ser: 1.02 mg/dL — ABNORMAL HIGH (ref 0.44–1.00)
GFR calc Af Amer: >60 mL/min (ref >60)
GFR, EST NON AFRICAN AMERICAN: 53 mL/min — AB (ref >60)
GLUCOSE: 118 mg/dL — AB (ref 65–99)
Potassium: 3.3 mmol/L — ABNORMAL LOW (ref 3.5–5.1)
Sodium: 141 mmol/L (ref 135–145)
TOTAL PROTEIN: 7 g/dL (ref 6.5–8.1)

## 2017-06-26 LAB — URINALYSIS, ROUTINE W REFLEX MICROSCOPIC
BILIRUBIN URINE: NEGATIVE
Glucose, UA: NEGATIVE mg/dL
HGB URINE DIPSTICK: NEGATIVE
Ketones, ur: 20 mg/dL — AB
Leukocytes, UA: NEGATIVE
NITRITE: NEGATIVE
PROTEIN: NEGATIVE mg/dL
SPECIFIC GRAVITY, URINE: 1.011 (ref 1.005–1.030)
pH: 7 (ref 5.0–8.0)

## 2017-06-26 LAB — CBC
HEMATOCRIT: 37.3 % (ref 36.0–46.0)
HEMOGLOBIN: 13 g/dL (ref 12.0–15.0)
MCH: 31.3 pg (ref 26.0–34.0)
MCHC: 34.9 g/dL (ref 30.0–36.0)
MCV: 89.7 fL (ref 78.0–100.0)
Platelets: 233 10*3/uL (ref 150–400)
RBC: 4.16 MIL/uL (ref 3.87–5.11)
RDW: 15.5 % (ref 11.5–15.5)
WBC: 6.8 10*3/uL (ref 4.0–10.5)

## 2017-06-26 LAB — ETHANOL

## 2017-06-26 LAB — SALICYLATE LEVEL: Salicylate Lvl: <7 mg/dL (ref 2.8–30.0)

## 2017-06-26 LAB — CBG MONITORING, ED: GLUCOSE-CAPILLARY: 119 mg/dL — AB (ref 65–99)

## 2017-06-26 LAB — ACETAMINOPHEN LEVEL

## 2017-06-26 MED ORDER — LORAZEPAM 2 MG/ML IJ SOLN: 0.0000 mg | Freq: Two times a day (BID) | INTRAMUSCULAR | Status: DC

## 2017-06-26 MED ORDER — PANTOPRAZOLE SODIUM 40 MG PO TBEC
80.0000 mg | DELAYED_RELEASE_TABLET | Freq: Every day | ORAL | Status: DC
  Administered 2017-06-27: 80 mg via ORAL
  Filled 2017-06-26: qty 2

## 2017-06-26 MED ORDER — POTASSIUM CHLORIDE CRYS ER 20 MEQ PO TBCR
40.0000 meq | EXTENDED_RELEASE_TABLET | Freq: Once | ORAL | Status: AC
  Administered 2017-06-27: 40 meq via ORAL
  Filled 2017-06-26: qty 2

## 2017-06-26 MED ORDER — HYDROXYZINE HCL 25 MG PO TABS
50.0000 mg | ORAL_TABLET | Freq: Every day | ORAL | Status: DC
  Administered 2017-06-27: 50 mg via ORAL
  Filled 2017-06-26: qty 2

## 2017-06-26 MED ORDER — LORATADINE 10 MG PO TABS
10.0000 mg | ORAL_TABLET | Freq: Every day | ORAL | Status: DC
  Administered 2017-06-27: 10 mg via ORAL
  Filled 2017-06-26: qty 1

## 2017-06-26 MED ORDER — LABETALOL HCL 200 MG PO TABS
200.0000 mg | ORAL_TABLET | Freq: Two times a day (BID) | ORAL | Status: DC
  Administered 2017-06-27 (×2): 200 mg via ORAL
  Filled 2017-06-26 (×4): qty 1

## 2017-06-26 MED ORDER — HYDRALAZINE HCL 50 MG PO TABS
50.0000 mg | ORAL_TABLET | Freq: Two times a day (BID) | ORAL | Status: DC
  Administered 2017-06-27 (×2): 50 mg via ORAL
  Filled 2017-06-26 (×3): qty 1

## 2017-06-26 MED ORDER — AMLODIPINE BESYLATE 5 MG PO TABS
10.0000 mg | ORAL_TABLET | Freq: Every day | ORAL | Status: DC
  Administered 2017-06-27: 10 mg via ORAL
  Filled 2017-06-26: qty 2

## 2017-06-26 MED ORDER — VENLAFAXINE HCL 75 MG PO TABS
75.0000 mg | ORAL_TABLET | Freq: Three times a day (TID) | ORAL | Status: DC
  Administered 2017-06-27: 75 mg via ORAL
  Filled 2017-06-26 (×3): qty 1

## 2017-06-26 MED ORDER — THIAMINE HCL 100 MG/ML IJ SOLN: 100.0000 mg | Freq: Every day | INTRAMUSCULAR | Status: DC

## 2017-06-26 MED ORDER — LORAZEPAM 1 MG PO TABS: 0.0000 mg | ORAL_TABLET | Freq: Two times a day (BID) | ORAL | Status: DC

## 2017-06-26 MED ORDER — QUETIAPINE FUMARATE 25 MG PO TABS
25.0000 mg | ORAL_TABLET | Freq: Every day | ORAL | Status: DC
  Administered 2017-06-27: 25 mg via ORAL
  Filled 2017-06-26: qty 1

## 2017-06-26 MED ORDER — VITAMIN B-1 100 MG PO TABS
100.0000 mg | ORAL_TABLET | Freq: Every day | ORAL | Status: DC
  Administered 2017-06-27: 100 mg via ORAL
  Filled 2017-06-26: qty 1

## 2017-06-26 MED ORDER — TRAZODONE HCL 100 MG PO TABS
200.0000 mg | ORAL_TABLET | Freq: Every day | ORAL | Status: DC
  Administered 2017-06-27: 200 mg via ORAL
  Filled 2017-06-26: qty 2

## 2017-06-26 MED ORDER — LORAZEPAM 2 MG/ML IJ SOLN: 0.0000 mg | Freq: Four times a day (QID) | INTRAMUSCULAR | Status: DC

## 2017-06-26 MED ORDER — ONDANSETRON HCL 4 MG PO TABS: 4.0000 mg | ORAL_TABLET | Freq: Three times a day (TID) | ORAL | Status: DC | PRN

## 2017-06-26 MED ORDER — ATORVASTATIN CALCIUM 20 MG PO TABS
20.0000 mg | ORAL_TABLET | Freq: Every day | ORAL | Status: DC
  Filled 2017-06-26: qty 1

## 2017-06-26 MED ORDER — LORAZEPAM 1 MG PO TABS
0.0000 mg | ORAL_TABLET | Freq: Four times a day (QID) | ORAL | Status: DC
  Administered 2017-06-27: 2 mg via ORAL
  Filled 2017-06-26: qty 2

## 2017-06-26 MED ORDER — IBUPROFEN 200 MG PO TABS: 600.0000 mg | ORAL_TABLET | Freq: Three times a day (TID) | ORAL | Status: DC | PRN

## 2017-06-26 NOTE — ED Provider Notes (Signed)
[facility] DEPT Provider Note   CSN: [id number] Arrival date & time: 06/26/17  1849     History   Chief Complaint Chief Complaint  Patient presents with  . intoxicated    HPI Kendra Hernandez is a 76 y.o. female.  Patient with history of alcoholism presents with intoxication. Patient was reportedly found with bottles of rubbing alcohol. She admits to drinking this today. Level V caveat due to alcohol intoxication.      Past Medical History:  Diagnosis Date  . Anxiety   . Arthritis   . COPD (chronic obstructive pulmonary disease) ([place])   . Hyperlipidemia   . Hypertension   . Hypothyroid   . Insomnia   . Reflux   . Repeated falls   . Skin cancer   . Stress   . Stroke (cerebrum) [facility])     Patient Active Problem List   Diagnosis Date Noted  . Major depressive disorder, recurrent episode ([place]) 12/21/2016  . Major depressive disorder, recurrent episode, severe ([place]) 11/24/2016  . Alcohol withdrawal ([place]) 07/17/2016  . Withdrawal symptoms, alcohol ([place]) 07/17/2016  . Hypothyroidism 07/17/2016  . History of CVA (cerebrovascular accident) 07/17/2016  . Head contusion 07/17/2016  . Fall 07/17/2016  . Insomnia 07/17/2016  . Essential hypertension 07/17/2016  . Abnormality of gait 01/24/2016  . Memory loss 01/24/2016  . Acute right MCA stroke ([place]) 01/09/2016  . Essential hypertension, benign 01/09/2016  . Hyperlipidemia LDL goal <100 01/09/2016  . Other specified hypothyroidism 01/09/2016  . Cervicalgia 01/09/2016  . Esophageal reflux 01/09/2016  . Major depression, chronic 01/09/2016    Past Surgical History:  Procedure Laterality Date  . COLONOSCOPY     With polyp removal   . MANDIBLE FRACTURE SURGERY     Placement of partial jaw due to abcess  . SKIN CANCER EXCISION     Removal of several skin cancers  . TOE SURGERY Right    Removal of toe     OB History    No data available       Home Medications    Prior to Admission medications     Medication Sig Start Date End Date Taking? Authorizing Provider  amLODipine (NORVASC) 10 MG tablet Take 10 mg by mouth daily.    [provider]  aspirin EC 81 MG tablet Take 81 mg by mouth daily.     [provider]  atorvastatin (LIPITOR) 20 MG tablet Take 20 mg by mouth daily.    [provider]  clindamycin (CLEOCIN) 150 MG capsule TK 2 CS PO Q 6 H TAT 03/17/17   [provider]  folic acid (FOLVITE) 1 MG tablet Take 1 tablet (1 mg total) by mouth daily. 07/21/16   Reyne Dumas, MD  hydrALAZINE (APRESOLINE) 50 MG tablet Take 50 mg by mouth 2 (two) times daily.    [provider]  ibuprofen (ADVIL,MOTRIN) 800 MG tablet TK 1 T PO Q 12 H PRN P 03/17/17   [provider]  labetalol (NORMODYNE) 200 MG tablet Take 200 mg by mouth 2 (two) times daily.     [provider]  levothyroxine (SYNTHROID, LEVOTHROID) 25 MCG tablet TK 1 T PO QD ON AN EMPTY STOMACH 04/13/17   [provider]  loratadine (CLARITIN) 10 MG tablet Take 10 mg by mouth daily.    [provider]  Menthol, Topical Analgesic, (BIOFREEZE EX) Apply 1 application topically as needed (for muscle pain). Pt applies to neck and shoulders.    [provider]  omeprazole (PRILOSEC) 40 MG capsule Take 40 mg by mouth daily.    [provider]  QUEtiapine (SEROQUEL) 25 MG tablet Take 25 mg by mouth at bedtime.    [provider]  sucralfate (CARAFATE) 1 g tablet Take 1 g by mouth 4 (four) times daily -  with meals and at bedtime.    [provider]  traZODone (DESYREL) 100 MG tablet Take 200 mg by mouth at bedtime.     [provider]  venlafaxine (EFFEXOR) 75 MG tablet Take 75 mg by mouth 2 (two) times daily. Pt takes with a 100mg  tablet.    [provider]    Family History Family History  Problem Relation Age of Onset  . Emphysema Mother   . Rheumatologic disease Mother   . Heart attack Father   . Emphysema  Brother   . Pancreatic cancer Daughter     Social History Social History  Substance Use Topics  . Smoking status: Current Every Day Smoker    Packs/day: 2.00    Years: 30.00    Types: Cigarettes  . Smokeless tobacco: Never Used  . Alcohol use 0.0 oz/week     Comment: last drink 2 wks ago 11-05-16, but is uncontrollable.     Allergies   Ceftin [cefuroxime axetil]; Penicillins; and Simvastatin   Review of Systems Review of Systems  Unable to perform ROS: Mental status change     Physical Exam Updated Vital Signs BP 129/68 (BP Location: Right Arm)   Pulse 82   Temp 98.2 F (36.8 C) (Oral)   Resp 16   SpO2 92%   Physical Exam  Constitutional: She appears well-developed and well-nourished.  HENT:  Head: Normocephalic and atraumatic.  Mouth/Throat: Oropharynx is clear and moist.  Eyes: Conjunctivae are normal. Pupils are equal, round, and reactive to light. Right eye exhibits no discharge. Left eye exhibits no discharge.  Neck: Normal range of motion. Neck supple.  Cardiovascular: Normal rate, regular rhythm and normal heart sounds.   Pulmonary/Chest: Effort normal and breath sounds normal. No respiratory distress. She has no wheezes. She has no rales.  Mild tachypnea. Breaths are shallow. No Kussmaul.   Abdominal: Soft. There is no tenderness.  Neurological: She is alert.  Patient with slurred speech. Not oriented to time.  Skin: Skin is warm and dry.  Psychiatric: She has a normal mood and affect.  Nursing note and vitals reviewed.    ED Treatments / Results  Labs (all labs ordered are listed, but only abnormal results are displayed) Labs Reviewed  COMPREHENSIVE METABOLIC PANEL - Abnormal; Notable for the following:       Result Value   Potassium 3.3 (*)    Glucose, Bld 118 (*)    Creatinine, Ser 1.02 (*)    ALT 11 (*)    GFR calc non Af Amer 53 (*)    All other components within normal limits  URINALYSIS, ROUTINE W REFLEX MICROSCOPIC - Abnormal; Notable  for the following:    APPearance HAZY (*)    Ketones, ur 20 (*)    All other components within normal limits  ACETAMINOPHEN LEVEL - Abnormal; Notable for the following:    Acetaminophen (Tylenol), Serum <10 (*)    All other components within normal limits  CBG MONITORING, ED - Abnormal; Notable for the following:    Glucose-Capillary 119 (*)    All other components within normal limits  CBC  ETHANOL  RAPID URINE DRUG SCREEN, HOSP PERFORMED  SALICYLATE LEVEL  EKG  EKG Interpretation None       Radiology No results found.  Procedures Procedures (including critical care time)  Medications Ordered in ED Medications - No data to display   Initial Impression / Assessment and Plan / ED Course  I have reviewed the triage vital signs and the nursing notes.  Pertinent labs & imaging results that were available during my care of the patient were reviewed by me and considered in my medical decision making (see chart for details).     Patient seen and examined. Work-up initiated.   Vital signs reviewed and are as follows: BP 129/68 (BP Location: Right Arm)   Pulse 82   Temp 98.2 F (36.8 C) (Oral)   Resp 16   SpO2 92%   10:29 PM Patient is medically cleared but does need time to metabolize alcohol. She is under IVC. She will need social work consult.   IVC states that patient declared suicidal ideation. Will need TTS consult as well.    Final Clinical Impressions(s) / ED Diagnoses   Final diagnoses:  Isopropyl alcohol poisoning  Suicidal ideation   Pt under IVC, metabolism of isopropyl alcohol.    New Prescriptions New Prescriptions   No medications on file     Carlisle Cater, Hershal Coria 06/26/17 2244    Carlisle Cater, PA-C 06/26/17 2311    Duffy Bruce, MD 06/27/17 1230

## 2017-06-26 NOTE — ED Notes (Signed)
Bed: WA16 Expected date:  Expected time:  Means of arrival:  Comments: EMS/ETOH 

## 2017-06-26 NOTE — BHH Counselor (Addendum)
Clinician attempted to contact pt's son Micheline Rough, [phone]) who initiated her IVC paperwork, to gather collateral information. Clinician was unable to leave a HIPPA compliant voice message.   Vertell Novak, MS, [facility], [facility] Triage Specialist [phone]

## 2017-06-26 NOTE — Progress Notes (Addendum)
Consult request has been received. CSW attempting to follow up at present time. Per consult note: "Patient lives alone-unable to care for self-son placed under IVC".    Per note, pt's son is Surveyor, mining at ph: [phone].  Per CN, CN has placed a TTS consult. Per CN pt "HAS BEEN IVC'D BY HER SON".   11:24 PM CSW spoke to TTS and TTS is responding to consult shortly.  Alphonse Guild. Danella Philson, Latanya Presser, LCAS Clinical Social Worker Ph: [phone]

## 2017-06-26 NOTE — ED Triage Notes (Signed)
Per EMS-states [facility] came to care for patient this am-noticed she was not acting like herself-son was called and came to patient's home-states they found rubbing alcohol-states patient has a history of alcoholism-states son is going to Magistrate to take out IVC papers

## 2017-06-26 NOTE — BH Assessment (Addendum)
Tele Assessment Note   Kendra Hernandez is an 76 y.o. female, who presents involuntary and unaccompanied to [facility]. Pt was a poor historian during the assessment. Pt reported, drinking a half a cup of rubbing alcohol. Pt reported, a previous suicide attempt, six months ago but did not specify. Pt reported, hallucinating yesterday however, she did not specify. During assessment, pt continues to ask for her caregiver. Pt denied SI, HI.   Pt was IVC'd by her son. Per pt's IVC: "Respondent abuses alcohol, has previous mental health commitments, drank rubbing alcohol and advised  Her family and responding emergency staff she wanted to die. She is a danger to herself at this time."   Clinician was unable to assess: abuse, education status, marital status, legal involvement, access to weapons, self-injurious behaviors, contract to safety, orientation, history of violence, memory, sleep, appetite. Pt reported, having a counselor however she could recall their name. Pt reported inpatient treatment six months ago at [facility] for her suicidal attempt.   Pt presents quiet/awake in scrubs with soft/slow speech. Pt's eye contact was fair. Pt's mood was helpless. Pt's affect was flat. Pt's thought process was circumstantial. Pt's judgement was impaired. Pt's insight and impulse control are poor.   Diagnosis: Deferred  Past Medical History:  Past Medical History:  Diagnosis Date  . Anxiety   . Arthritis   . COPD (chronic obstructive pulmonary disease) ([place])   . Hyperlipidemia   . Hypertension   . Hypothyroid   . Insomnia   . Reflux   . Repeated falls   . Skin cancer   . Stress   . Stroke (cerebrum) [facility])     Past Surgical History:  Procedure Laterality Date  . COLONOSCOPY     With polyp removal   . MANDIBLE FRACTURE SURGERY     Placement of partial jaw due to abcess  . SKIN CANCER EXCISION     Removal of several skin cancers  . TOE SURGERY Right    Removal of toe     Family History:   Family History  Problem Relation Age of Onset  . Emphysema Mother   . Rheumatologic disease Mother   . Heart attack Father   . Emphysema Brother   . Pancreatic cancer Daughter     Social History:  reports that she has been smoking Cigarettes.  She has a 60.00 pack-year smoking history. She has never used smokeless tobacco. She reports that she drinks alcohol. She reports that she does not use drugs.  Additional Social History:  Alcohol / Drug Use Pain Medications: See MAR Prescriptions: See MAR Over the Counter: See MAR History of alcohol / drug use?: Yes Substance #1 Name of Substance 1: Alcohol  1 - Age of First Use: UTA 1 - Amount (size/oz): Pt reported, drinking a half a cup of rubbing alcohol. Pt's UDS is <5.  1 - Frequency: UTA 1 - Duration: UTA 1 - Last Use / Amount: UTA  CIWA: CIWA-Ar BP: 139/68 Pulse Rate: 82 COWS:    PATIENT STRENGTHS: (choose at least two) Average or above average intelligence General fund of knowledge  Allergies:  Allergies  Allergen Reactions  . Ceftin [Cefuroxime Axetil] Other (See Comments)    Reaction:  Unknown   . Penicillins Hives and Other (See Comments)    Hives and swelling as a child Has patient had a PCN reaction causing immediate rash, facial/tongue/throat swelling, SOB or lightheadedness with hypotension: Yes Has patient had a PCN reaction causing severe rash involving mucus membranes  or skin necrosis: No Has patient had a PCN reaction that required hospitalization No Has patient had a PCN reaction occurring within the last 10 years: No If all of the above answers are "NO", then may proceed with Cephalosporin use.   . Simvastatin Other (See Comments)    Reaction:  Unknown     Home Medications:  (Not in a hospital admission)  OB/GYN Status:  No LMP recorded. Patient is postmenopausal.  General Assessment Data Location of Assessment: [facility] TTS Assessment: In system Is this a Tele or Face-to-Face Assessment?:  Face-to-Face Is this an Initial Assessment or a Re-assessment for this encounter?: Initial Assessment Marital status: Other (comment) (UTA) Living Arrangements: Alone Can pt return to current living arrangement?: Yes Admission Status: Involuntary Referral Source: Self/Family/Friend Insurance type: Medicare     Crisis Care Plan Living Arrangements: Alone Legal Guardian: Other: (Self) Name of Psychiatrist: [place] Name of Therapist: Pt couldn't recall.   Education Status Is patient currently in school?: No Current Grade: UTA Highest grade of school patient has completed: [employer] Name of school: UTA Contact person: UTA  Risk to self with the past 6 months Suicidal Ideation: Yes-Currently Present (Per IVC however pt denies. ) Has patient been a risk to self within the past 6 months prior to admission? : Yes Suicidal Intent:  (UTA) Has patient had any suicidal intent within the past 6 months prior to admission? : Yes Is patient at risk for suicide?: Yes Suicidal Plan?:  (UTA) Has patient had any suicidal plan within the past 6 months prior to admission? : Other (comment) (UTA) Access to Means:  (UTA) What has been your use of drugs/alcohol within the last 12 months?: Rubbing alcohol  Previous Attempts/Gestures: Yes How many times?: 1 Other Self Harm Risks: UTA Triggers for Past Attempts: Unknown Intentional Self Injurious Behavior:  (UTA) Family Suicide History: Unable to assess Recent stressful life event(s): Other (Comment) (Pt reported, not getting along with her son. ) Persecutory voices/beliefs?: No Depression:  (UTA) Depression Symptoms:  (UTA) Substance abuse history and/or treatment for substance abuse?: Yes Suicide prevention information given to non-admitted patients: Not applicable  Risk to Others within the past 6 months Homicidal Ideation: No (Pt denies. ) Does patient have any lifetime risk of violence toward others beyond the six months prior to admission? :  No Thoughts of Harm to Others: No Current Homicidal Intent: No Current Homicidal Plan: No Access to Homicidal Means: No Identified Victim: NA History of harm to others?:  (UTA) Assessment of Violence:  (UTA) Violent Behavior Description: UTA Does patient have access to weapons?:  (UTA) Criminal Charges Pending?:  (UTA) Does patient have a court date:  (UTA) Is patient on probation?:  (UTA)  Psychosis Hallucinations:  (UTA) Delusions: Unspecified  Mental Status Report Appearance/Hygiene: In scrubs Eye Contact: Fair Motor Activity: Unremarkable Speech: Slow, Soft Level of Consciousness: Quiet/awake Mood: Helpless Affect: Flat Anxiety Level: Minimal Thought Processes: Circumstantial Judgement: Impaired Orientation: Unable to assess Obsessive Compulsive Thoughts/Behaviors: Unable to Assess  Cognitive Functioning Concentration: Decreased Memory: Unable to Assess IQ: Average Insight: Poor Impulse Control: Poor Appetite:  (UTA) Weight Loss:  (UTA) Weight Gain:  (UTA) Sleep: Unable to Assess Total Hours of Sleep:  (UTA) Vegetative Symptoms: Unable to Assess  ADLScreening [facility] Assessment Services) Patient's cognitive ability adequate to safely complete daily activities?: Yes Patient able to express need for assistance with ADLs?: Yes Independently performs ADLs?: Yes (appropriate for developmental age)  Prior Inpatient Therapy Prior Inpatient Therapy: Yes Prior Therapy Dates:  Pt reported, six months ago. Prior Therapy Facilty/Provider(s): [facility] Reason for Treatment: Suicide attempt.  Prior Outpatient Therapy Prior Outpatient Therapy: Yes Prior Therapy Dates: Current Prior Therapy Facilty/Provider(s): UTA Reason for Treatment: Counseling. Does patient have an ACCT team?: No Does patient have Intensive In-House Services?  : No Does patient have [facility] services? : No Does patient have P4CC services?: No  ADL Screening (condition at time of  admission) Patient's cognitive ability adequate to safely complete daily activities?: Yes Is the patient deaf or have difficulty hearing?: Yes Does the patient have difficulty seeing, even when wearing glasses/contacts?: Yes Does the patient have difficulty concentrating, remembering, or making decisions?: Yes Patient able to express need for assistance with ADLs?: Yes Does the patient have difficulty dressing or bathing?: No Independently performs ADLs?: Yes (appropriate for developmental age) Does the patient have difficulty walking or climbing stairs?: No Weakness of Legs: None Weakness of Arms/Hands: None       Abuse/Neglect Assessment (Assessment to be complete while patient is alone) Physical Abuse:  (UTA) Verbal Abuse:  (UTA) Sexual Abuse:  (UTA) Exploitation of patient/patient's resources:  (UTA) Self-Neglect:  (UTA)     Advance Directives (For Healthcare) Does Patient Have a Medical Advance Directive?: No Would patient like information on creating a medical advance directive?: No - Patient declined    Additional Information 1:1 In Past 12 Months?: No CIRT Risk: No Elopement Risk: No Does patient have medical clearance?: Yes     Disposition: Lindon Romp, NP recommends overnight observation pending am evaluation. Disposition discussed with Dr. Ellender Hose and Karna Christmas, [place] Nurse.  Disposition Initial Assessment Completed for this Encounter: Yes Disposition of Patient: Other dispositions (AM Psychiatric Evaluation. ) Other disposition(s): Other (Comment) (AM Psychiatric Evaluation. )  Vertell Novak 06/27/2017 12:03 AM   Vertell Novak, MS, [facility], [place] Triage Specialist [phone]

## 2017-06-27 DIAGNOSIS — R4182 Altered mental status, unspecified: Secondary | ICD-10-CM | POA: Diagnosis not present

## 2017-06-27 DIAGNOSIS — F17210 Nicotine dependence, cigarettes, uncomplicated: Secondary | ICD-10-CM | POA: Diagnosis not present

## 2017-06-27 DIAGNOSIS — F1020 Alcohol dependence, uncomplicated: Secondary | ICD-10-CM | POA: Diagnosis present

## 2017-06-27 DIAGNOSIS — F1910 Other psychoactive substance abuse, uncomplicated: Secondary | ICD-10-CM

## 2017-06-27 DIAGNOSIS — T512X1A Toxic effect of 2-Propanol, accidental (unintentional), initial encounter: Secondary | ICD-10-CM | POA: Diagnosis not present

## 2017-06-27 NOTE — BH Assessment (Addendum)
[place] Assessment Progress Note  Per Ambrose Finland, MD, this pt does not require psychiatric hospitalization at this time.  Pt presents under IVC initiated by her son, which Dr Louretta Shorten has rescinded.  Pt is to be discharged from [facility] with recommendation to continue treatment at the [facility].  This has been included in pt's discharge instructions.  Pt's nurse has been notified.  Jalene Mullet, MA Triage Specialist [phone]    Addendum:  This Probation officer called the [facility] to ascertain when her next appointment is scheduled.  They report that they have no record of this patient.  Referral information has been changed to the [facility].  Pt's nurse has been notified.  Jalene Mullet, [place] Triage Specialist [phone]

## 2017-06-27 NOTE — ED Notes (Signed)
Psyche team at the bedside.

## 2017-06-27 NOTE — Care Management (Addendum)
ED CM received consult for [facility] recommendation CM reviewed patient record, patient does not have an identifiable HH need at this time.  Patient was IVC'd by son and BH assessment cleared patient for OP BH follow up.  Patient should follow up with PCP follow up appointment Monday July 9th at 2pm. Patient updated and verbalized understanding teach back done.

## 2017-06-27 NOTE — Discharge Instructions (Addendum)
For your ongoing behavioral health needs, you are advised to follow up with the [facility].  Call them at your earliest opportunity to ask about scheduling an intake appointment:       [facility]      [address] [place], [place] [address]      [phone]

## 2017-06-27 NOTE — ED Notes (Addendum)
Patient's son came and brought a suitcase with clothes and a set of dentures in a baggie. Dentures placed in a patient belongings bag and tied to the suitcase. Both items were labeled. And placed under the [id number] nurses area because it would not fit in the designated cabinet.

## 2017-06-27 NOTE — Progress Notes (Addendum)
CSW spoke to the pt's son who stated pt has had, since November, multiple attempts at suicide, as well as manywhen the pt was younger.  Per son, pt at this time has no access to money, but begs money from neighbors and/or panhandles to get money for alcohol and other needs.  Per son, pt's son pays for a caregiver 5 times a week, as well as pays the pt's bills, rent and food.  Per pt's son, pt has a HX of stroke from one and a half years ago (Jan 2017).  CSW provided pt's son information on [facility] on [address] as well as the walk-in hours Mon-Friday from 7:30-9am.   Pt's son asked CSW to counsel pt on treatment options.  Alphonse Guild. Athalia Setterlund, Reed Pandy, CSI Clinical Social Worker Ph: [phone]

## 2017-06-27 NOTE — ED Notes (Signed)
Patient told EMS that she could not find her house keys. Patient's son called to see if he could meet EMS at the patient's home to let her in. Patient's son/Gene stated, "I guess they will have to crawl through a window." and then laughed. Patient's son then said, "I guess I am responsible for her now, Huh? Patient's son then stated he could meet them at the patient's house in 15 minutes.

## 2017-06-27 NOTE — Consult Note (Signed)
[facility] Consult   Reason for Consult: Suicidal ideation Referring Physician:  EDP Patient Identification: Kendra Hernandez MRN:  [id number] Principal Diagnosis: Alcoholism, chronic ([place]) Diagnosis:   Patient Active Problem List   Diagnosis Date Noted  . Alcoholism, chronic ([place]) [F10.20] 06/27/2017    Priority: High  . Isopropyl alcohol poisoning [T51.2X1A]   . Major depressive disorder, recurrent episode ([place]) [F33.9] 12/21/2016  . Major depressive disorder, recurrent episode, severe ([place]) [F33.2] 11/24/2016  . Alcohol withdrawal ([place]) [F10.239] 07/17/2016  . Withdrawal symptoms, alcohol ([place]) [F10.239] 07/17/2016  . Hypothyroidism [E03.9] 07/17/2016  . History of CVA (cerebrovascular accident) [Z86.73] 07/17/2016  . Head contusion [S00.93XA] 07/17/2016  . Fall [W19.XXXA] 07/17/2016  . Insomnia [G47.00] 07/17/2016  . Essential hypertension [I10] 07/17/2016  . Abnormality of gait [R26.9] 01/24/2016  . Memory loss [R41.3] 01/24/2016  . Acute right MCA stroke ([place]) [I63.511] 01/09/2016  . Essential hypertension, benign [I10] 01/09/2016  . Hyperlipidemia LDL goal <100 [E78.5] 01/09/2016  . Other specified hypothyroidism [E03.8] 01/09/2016  . Cervicalgia [M54.2] 01/09/2016  . Esophageal reflux [K21.9] 01/09/2016  . Major depression, chronic [F34.1] 01/09/2016    Total Time spent with patient: 30 minutes  Subjective:   Kendra Hernandez is a 76 y.o. female patient admitted with suicidal ideation and chronic alcoholism.  HPI:  Kendra Hernandez is a 76 year old female who presented to the [facility], under IVC placed by her family, for apparently making a statement about wanting to die. Pt was calm and cooperative, alert and oriented to place and self, hard of hearing, and appropriates for situation. Pt stated it was time to take her medications when asked if she knew why she was at the hospital.  Pt did not express any suicidal or homicidal ideations and does not appear to be  responding to internal stimuli. Pt is considered to be psychiatrically cleared and does not meet inpatient criteria.   Past Psychiatric History: Chronic alcoholism, Suicidal Ideation  Risk to Self: None Risk to Others: None Prior Inpatient Therapy: Prior Inpatient Therapy: Yes Prior Therapy Dates: Pt reported, six months ago. Prior Therapy Facilty/Provider(s): [facility] Reason for Treatment: Suicide attempt. Prior Outpatient Therapy: Prior Outpatient Therapy: Yes Prior Therapy Dates: Current Prior Therapy Facilty/Provider(s): [facility] Reason for Treatment: Counseling. Does patient have an ACCT team?: No Does patient have Intensive In-House Services?  : No Does patient have [facility] services? : No Does patient have P4CC services?: No  Past Medical History:  Past Medical History:  Diagnosis Date  . Anxiety   . Arthritis   . COPD (chronic obstructive pulmonary disease) ([place])   . Hyperlipidemia   . Hypertension   . Hypothyroid   . Insomnia   . Reflux   . Repeated falls   . Skin cancer   . Stress   . Stroke (cerebrum) [facility])     Past Surgical History:  Procedure Laterality Date  . COLONOSCOPY     With polyp removal   . MANDIBLE FRACTURE SURGERY     Placement of partial jaw due to abcess  . SKIN CANCER EXCISION     Removal of several skin cancers  . TOE SURGERY Right    Removal of toe    Family History:  Family History  Problem Relation Age of Onset  . Emphysema Mother   . Rheumatologic disease Mother   . Heart attack Father   . Emphysema Brother   . Pancreatic cancer Daughter    Family Psychiatric  History: Unknown Social History:  History  Alcohol Use  . 0.0 oz/week    Comment: last drink 2 wks ago 11-05-16, but is uncontrollable.     History  Drug Use No    Social History   Social History  . Marital status: Divorced    Spouse name: N/A  . Number of children: 2  . Years of education: 13   Occupational History  . Retired    Social History Main  Topics  . Smoking status: Current Every Day Smoker    Packs/day: 2.00    Years: 30.00    Types: Cigarettes  . Smokeless tobacco: Never Used  . Alcohol use 0.0 oz/week     Comment: last drink 2 wks ago 11-05-16, but is uncontrollable.  . Drug use: No  . Sexual activity: Not Asked   Other Topics Concern  . None   Social History Narrative   Right-handed.   Currently in stroke rehab at [facility].     Occasional caffeine use.   Additional Social History:    Allergies:   Allergies  Allergen Reactions  . Ceftin [Cefuroxime Axetil] Other (See Comments)    Reaction:  Unknown   . Penicillins Hives and Other (See Comments)    Hives and swelling as a child Has patient had a PCN reaction causing immediate rash, facial/tongue/throat swelling, SOB or lightheadedness with hypotension: Yes Has patient had a PCN reaction causing severe rash involving mucus membranes or skin necrosis: No Has patient had a PCN reaction that required hospitalization No Has patient had a PCN reaction occurring within the last 10 years: No If all of the above answers are "NO", then may proceed with Cephalosporin use.   . Simvastatin Other (See Comments)    Reaction:  Unknown     Labs:  Results for orders placed or performed during the hospital encounter of 06/26/17 (from the past 48 hour(s))  CBG monitoring, ED     Status: Abnormal   Collection Time: 06/26/17  7:29 PM  Result Value Ref Range   Glucose-Capillary 119 (H) 65 - 99 mg/dL  CBC     Status: None   Collection Time: 06/26/17  7:35 PM  Result Value Ref Range   WBC 6.8 4.0 - 10.5 K/uL   RBC 4.16 3.87 - 5.11 MIL/uL   Hemoglobin 13.0 12.0 - 15.0 g/dL   HCT 37.3 36.0 - 46.0 %   MCV 89.7 78.0 - 100.0 fL   MCH 31.3 26.0 - 34.0 pg   MCHC 34.9 30.0 - 36.0 g/dL   RDW 15.5 11.5 - 15.5 %   Platelets 233 150 - 400 K/uL  Comprehensive metabolic panel     Status: Abnormal   Collection Time: 06/26/17  7:35 PM  Result Value Ref Range   Sodium 141 135 -  145 mmol/L   Potassium 3.3 (L) 3.5 - 5.1 mmol/L   Chloride 109 101 - 111 mmol/L   CO2 23 22 - 32 mmol/L   Glucose, Bld 118 (H) 65 - 99 mg/dL   BUN 9 6 - 20 mg/dL   Creatinine, Ser 1.02 (H) 0.44 - 1.00 mg/dL   Calcium 9.0 8.9 - 10.3 mg/dL   Total Protein 7.0 6.5 - 8.1 g/dL   Albumin 3.9 3.5 - 5.0 g/dL   AST 15 15 - 41 U/L   ALT 11 (L) 14 - 54 U/L   Alkaline Phosphatase 73 38 - 126 U/L   Total Bilirubin 0.4 0.3 - 1.2 mg/dL   GFR calc non Af Amer 53 (L) >60  mL/min   GFR calc Af Amer >60 >60 mL/min    Comment: (NOTE) The eGFR has been calculated using the CKD EPI equation. This calculation has not been validated in all clinical situations. eGFR's persistently <60 mL/min signify possible Chronic Kidney Disease.    Anion gap 9 5 - 15  Ethanol     Status: None   Collection Time: 06/26/17  7:35 PM  Result Value Ref Range   Alcohol, Ethyl (B) <5 <5 mg/dL    Comment:        LOWEST DETECTABLE LIMIT FOR SERUM ALCOHOL IS 5 mg/dL FOR MEDICAL PURPOSES ONLY   Acetaminophen level     Status: Abnormal   Collection Time: 06/26/17  7:35 PM  Result Value Ref Range   Acetaminophen (Tylenol), Serum <10 (L) 10 - 30 ug/mL    Comment:        THERAPEUTIC CONCENTRATIONS VARY SIGNIFICANTLY. A RANGE OF 10-30 ug/mL MAY BE AN EFFECTIVE CONCENTRATION FOR MANY PATIENTS. HOWEVER, SOME ARE BEST TREATED AT CONCENTRATIONS OUTSIDE THIS RANGE. ACETAMINOPHEN CONCENTRATIONS >150 ug/mL AT 4 HOURS AFTER INGESTION AND >50 ug/mL AT 12 HOURS AFTER INGESTION ARE OFTEN ASSOCIATED WITH TOXIC REACTIONS.   Salicylate level     Status: None   Collection Time: 06/26/17  7:35 PM  Result Value Ref Range   Salicylate Lvl <2.5 2.8 - 30.0 mg/dL  Rapid urine drug screen (hospital performed)     Status: None   Collection Time: 06/26/17  9:51 PM  Result Value Ref Range   Opiates NONE DETECTED NONE DETECTED   Cocaine NONE DETECTED NONE DETECTED   Benzodiazepines NONE DETECTED NONE DETECTED   Amphetamines NONE DETECTED  NONE DETECTED   Tetrahydrocannabinol NONE DETECTED NONE DETECTED   Barbiturates NONE DETECTED NONE DETECTED    Comment:        DRUG SCREEN FOR MEDICAL PURPOSES ONLY.  IF CONFIRMATION IS NEEDED FOR ANY PURPOSE, NOTIFY LAB WITHIN 5 DAYS.        LOWEST DETECTABLE LIMITS FOR URINE DRUG SCREEN Drug Class       Cutoff (ng/mL) Amphetamine      1000 Barbiturate      200 Benzodiazepine   638 Tricyclics       937 Opiates          300 Cocaine          300 THC              50   Urinalysis, Routine w reflex microscopic     Status: Abnormal   Collection Time: 06/26/17  9:51 PM  Result Value Ref Range   Color, Urine YELLOW YELLOW   APPearance HAZY (A) CLEAR   Specific Gravity, Urine 1.011 1.005 - 1.030   pH 7.0 5.0 - 8.0   Glucose, UA NEGATIVE NEGATIVE mg/dL   Hgb urine dipstick NEGATIVE NEGATIVE   Bilirubin Urine NEGATIVE NEGATIVE   Ketones, ur 20 (A) NEGATIVE mg/dL   Protein, ur NEGATIVE NEGATIVE mg/dL   Nitrite NEGATIVE NEGATIVE   Leukocytes, UA NEGATIVE NEGATIVE    Current Facility-Administered Medications  Medication Dose Route Frequency Provider Last Rate Last Dose  . amLODipine (NORVASC) tablet 10 mg  10 mg Oral Daily Carlisle Cater, PA-C   10 mg at 06/27/17 1122  . atorvastatin (LIPITOR) tablet 20 mg  20 mg Oral q1800 Carlisle Cater, PA-C      . hydrALAZINE (APRESOLINE) tablet 50 mg  50 mg Oral BID Carlisle Cater, PA-C   50 mg at 06/27/17 1126  .  hydrOXYzine (ATARAX/VISTARIL) tablet 50 mg  50 mg Oral Daily Carlisle Cater, PA-C   50 mg at 06/27/17 1120  . ibuprofen (ADVIL,MOTRIN) tablet 600 mg  600 mg Oral Q8H PRN Carlisle Cater, PA-C      . labetalol (NORMODYNE) tablet 200 mg  200 mg Oral BID Carlisle Cater, PA-C   200 mg at 06/27/17 1120  . loratadine (CLARITIN) tablet 10 mg  10 mg Oral Daily Carlisle Cater, PA-C   10 mg at 06/27/17 1119  . LORazepam (ATIVAN) injection 0-4 mg  0-4 mg Intravenous Q6H Carlisle Cater, PA-C   Stopped at 06/27/17 0407   Or  . LORazepam (ATIVAN)  tablet 0-4 mg  0-4 mg Oral Q6H Geiple, Joshua, PA-C   2 mg at 06/27/17 0018  . [START ON 06/29/2017] LORazepam (ATIVAN) injection 0-4 mg  0-4 mg Intravenous Q12H Carlisle Cater, PA-C       Or  . Derrill Memo ON 06/29/2017] LORazepam (ATIVAN) tablet 0-4 mg  0-4 mg Oral Q12H Geiple, Joshua, PA-C      . ondansetron (ZOFRAN) tablet 4 mg  4 mg Oral Q8H PRN Carlisle Cater, PA-C      . pantoprazole (PROTONIX) EC tablet 80 mg  80 mg Oral Daily Carlisle Cater, PA-C   80 mg at 06/27/17 1120  . QUEtiapine (SEROQUEL) tablet 25 mg  25 mg Oral Daily Carlisle Cater, PA-C   25 mg at 06/27/17 1122  . thiamine (VITAMIN B-1) tablet 100 mg  100 mg Oral Daily Carlisle Cater, PA-C   100 mg at 06/27/17 1121   Or  . thiamine (B-1) injection 100 mg  100 mg Intravenous Daily Carlisle Cater, PA-C      . traZODone (DESYREL) tablet 200 mg  200 mg Oral QHS Carlisle Cater, PA-C   200 mg at 06/27/17 0110  . venlafaxine (EFFEXOR) tablet 75 mg  75 mg Oral TID WC Carlisle Cater, PA-C   75 mg at 06/27/17 1121   Current Outpatient Prescriptions  Medication Sig Dispense Refill  . amLODipine (NORVASC) 10 MG tablet Take 10 mg by mouth daily.    Marland Kitchen aspirin EC 81 MG tablet Take 81 mg by mouth daily.     Marland Kitchen atorvastatin (LIPITOR) 20 MG tablet Take 20 mg by mouth daily.    . hydrALAZINE (APRESOLINE) 50 MG tablet Take 50 mg by mouth 2 (two) times daily.    . hydrOXYzine (ATARAX/VISTARIL) 50 MG tablet Take 1 tablet by mouth daily.  1  . labetalol (NORMODYNE) 200 MG tablet Take 200 mg by mouth 2 (two) times daily.     Marland Kitchen loratadine (CLARITIN) 10 MG tablet Take 10 mg by mouth daily.    Marland Kitchen omeprazole (PRILOSEC) 40 MG capsule Take 40 mg by mouth daily.    . QUEtiapine (SEROQUEL) 25 MG tablet Take 25 mg by mouth daily.     . traZODone (DESYREL) 100 MG tablet Take 200 mg by mouth at bedtime.     Marland Kitchen venlafaxine (EFFEXOR) 75 MG tablet Take 75 mg by mouth 3 (three) times daily with meals.       Musculoskeletal: Strength & Muscle Tone: within normal  limits Gait & Station: normal Patient leans: N/A  Psychiatric Specialty Exam: Physical Exam  Constitutional: She appears well-developed.  Respiratory: Effort normal.  Musculoskeletal: Normal range of motion.  Neurological: She is alert.    Review of Systems  Psychiatric/Behavioral: Positive for depression and substance abuse. Negative for hallucinations, memory loss and suicidal ideas. The patient is not nervous/anxious and does not  have insomnia.   All other systems reviewed and are negative.   Blood pressure 128/77, pulse 74, temperature 98.5 F (36.9 C), temperature source Oral, resp. rate 16, SpO2 100 %.There is no height or weight on file to calculate BMI.  General Appearance: Casual  Eye Contact:  Good  Speech:  Clear and Coherent  Volume:  Normal  Mood:  Depressed  Affect:  Congruent and Depressed  Thought Process:  Coherent  Orientation:  Full (Time, Place, and Person)  Thought Content:  Logical  Suicidal Thoughts:  No  Homicidal Thoughts:  No  Memory:  Immediate;   Fair Recent;   Fair Remote;   Fair  Judgement:  Fair  Insight:  Fair  Psychomotor Activity:  Normal  Concentration:  Concentration: Good and Attention Span: Fair  Recall:  [place] of Knowledge:  Good  Language:  Good  Akathisia:  No  Handed:  Right  AIMS (if indicated):     Assets:  Agricultural consultant Resilience Social Support  ADL's:  Intact  Cognition:  WNL  Sleep:        Treatment Plan Summary: Plan Discharge Home  Follow up with outpatient resources as provided to you.  Take all medications as prescribed Avoid the use of alcohol and drugs  Disposition: No evidence of imminent risk to self or others at present.   Patient does not meet criteria for psychiatric inpatient admission. Supportive therapy provided about ongoing stressors.  Ethelene Hal, NP 06/27/2017 11:58 AM

## 2017-06-27 NOTE — ED Notes (Signed)
Pt. Documented in error May administer Ativan PO versus IV if patient is tolerating PO intake well.

## 2017-06-27 NOTE — Progress Notes (Signed)
CSW notified patients son, Gene, that patient had been medically/ psychiatrically at this time. Patients son stated "no.". Patients son expressed his concerns with patients mental health and reasoning behind IVC paperwork. CSW explained reasoning behind psych clearance-son continued to state "no". CSW questiond if son was patients legal guardian- son stated "no but she needs help right now". Patient lives alone and is able to return at this time. CSW notified ED CM for assistance. Patients belongings are with RN- son brought suitcase to ED.   Please reconsult CSW for further assistance.   Kingsley Spittle, LCSWA Clinical Social Worker [phone]

## 2017-06-27 NOTE — Progress Notes (Signed)
CSW confirmed with RN Corey Harold has been called.  CSW spoke with pt and confirmed pt is willing to go to treatment.  CSW updated pt's son and offered other [place] resources.  CSW confirmed pt's son is aware pt is D/C'ing.  Son is aware.  Pt's son was appreciative and thanked the CSW.  Please reconsult if future social work needs arise.  CSW signing off, as social work intervention is no longer needed.  Alphonse Guild. Gari Hartsell, Reed Pandy, CSI Clinical Social Worker Ph: [phone]

## 2017-06-27 NOTE — [facility] Suicide Risk Assessment (Signed)
Suicide Risk Assessment  Discharge Assessment   [facility] Discharge Suicide Risk Assessment   Principal Problem: Alcoholism, chronic [facility]) Discharge Diagnoses:  Patient Active Problem List   Diagnosis Date Noted  . Alcoholism, chronic ([place]) [F10.20] 06/27/2017    Priority: High  . Isopropyl alcohol poisoning [T51.2X1A]   . Major depressive disorder, recurrent episode ([place]) [F33.9] 12/21/2016  . Major depressive disorder, recurrent episode, severe ([place]) [F33.2] 11/24/2016  . Alcohol withdrawal ([place]) [F10.239] 07/17/2016  . Withdrawal symptoms, alcohol ([place]) [F10.239] 07/17/2016  . Hypothyroidism [E03.9] 07/17/2016  . History of CVA (cerebrovascular accident) [Z86.73] 07/17/2016  . Head contusion [S00.93XA] 07/17/2016  . Fall [W19.XXXA] 07/17/2016  . Insomnia [G47.00] 07/17/2016  . Essential hypertension [I10] 07/17/2016  . Abnormality of gait [R26.9] 01/24/2016  . Memory loss [R41.3] 01/24/2016  . Acute right MCA stroke ([place]) [I63.511] 01/09/2016  . Essential hypertension, benign [I10] 01/09/2016  . Hyperlipidemia LDL goal <100 [E78.5] 01/09/2016  . Other specified hypothyroidism [E03.8] 01/09/2016  . Cervicalgia [M54.2] 01/09/2016  . Esophageal reflux [K21.9] 01/09/2016  . Major depression, chronic [F34.1] 01/09/2016    Total Time spent with patient: 30 minutes  Musculoskeletal: Strength & Muscle Tone: within normal limits Gait & Station: normal Patient leans: N/A Psychiatric Specialty Exam: Physical Exam  Constitutional: She appears well-developed.  Respiratory: Effort normal.  Musculoskeletal: Normal range of motion.  Neurological: She is alert.   Review of Systems  Psychiatric/Behavioral: Positive for depression and substance abuse. Negative for hallucinations, memory loss and suicidal ideas. The patient is not nervous/anxious and does not have insomnia.   All other systems reviewed and are negative.  Blood pressure 128/77, pulse 74, temperature 98.5 F (36.9 C),  temperature source Oral, resp. rate 16, SpO2 100 %.There is no height or weight on file to calculate BMI. General Appearance: Casual Eye Contact:  Good Speech:  Clear and Coherent Volume:  Normal Mood:  Depressed Affect:  Congruent and Depressed Thought Process:  Coherent Orientation:  Full (Time, Place, and Person) Thought Content:  Logical Suicidal Thoughts:  No Homicidal Thoughts:  No Memory:  Immediate;   Fair Recent;   Fair Remote;   Fair Judgement:  Fair Insight:  Fair Psychomotor Activity:  Normal Concentration:  Concentration: Good and Attention Span: Fair Recall:  [employer] of Knowledge:  Good Language:  Good Akathisia:  No Handed:  Right AIMS (if indicated):    Assets:  Agricultural consultant Resilience Social Support ADL's:  Intact Cognition:  WNL  Mental Status Per Nursing Assessment::   On Admission:     Demographic Factors:  Age 76 or older, Divorced or widowed and Living alone  Loss Factors: NA  Historical Factors: Prior suicide attempts and Impulsivity  Risk Reduction Factors:   Sense of responsibility to family  Continued Clinical Symptoms:  Depression:   Impulsivity Alcohol/Substance Abuse/Dependencies  Cognitive Features That Contribute To Risk:  None    Suicide Risk:  Mild:  Suicidal ideation of limited frequency, intensity, duration, and specificity.  There are no identifiable plans, no associated intent, mild dysphoria and related symptoms, good self-control (both objective and subjective assessment), few other risk factors, and identifiable protective factors, including available and accessible social support.    Plan Of Care/Follow-up recommendations:  Activity:  as tolerated Diet:  heart Healthy  Ethelene Hal, NP 06/27/2017, 1:41 PM

## 2017-06-27 NOTE — Progress Notes (Signed)
CSW will follow up with patient once disposition has been determined.   Kingsley Spittle, LCSWA Clinical Social Worker [phone]

## 2017-07-14 DIAGNOSIS — H35412 Lattice degeneration of retina, left eye: Secondary | ICD-10-CM | POA: Diagnosis not present

## 2017-07-14 DIAGNOSIS — H35072 Retinal telangiectasis, left eye: Secondary | ICD-10-CM | POA: Diagnosis not present

## 2017-07-14 DIAGNOSIS — Z961 Presence of intraocular lens: Secondary | ICD-10-CM | POA: Diagnosis not present

## 2017-07-14 DIAGNOSIS — H35342 Macular cyst, hole, or pseudohole, left eye: Secondary | ICD-10-CM | POA: Diagnosis not present

## 2017-07-14 DIAGNOSIS — H35371 Puckering of macula, right eye: Secondary | ICD-10-CM | POA: Diagnosis not present

## 2017-07-22 DIAGNOSIS — E039 Hypothyroidism, unspecified: Secondary | ICD-10-CM | POA: Diagnosis not present

## 2017-08-07 IMAGING — CR DG CHEST 2V
2 series · 2 of 2 positions shown · non-contrast
Comparison: Radiographs July 28, 2016.

CLINICAL DATA: Hypertension, chronic obstructive pulmonary disease.

EXAM:
CHEST  2 VIEW

[w chest pa]
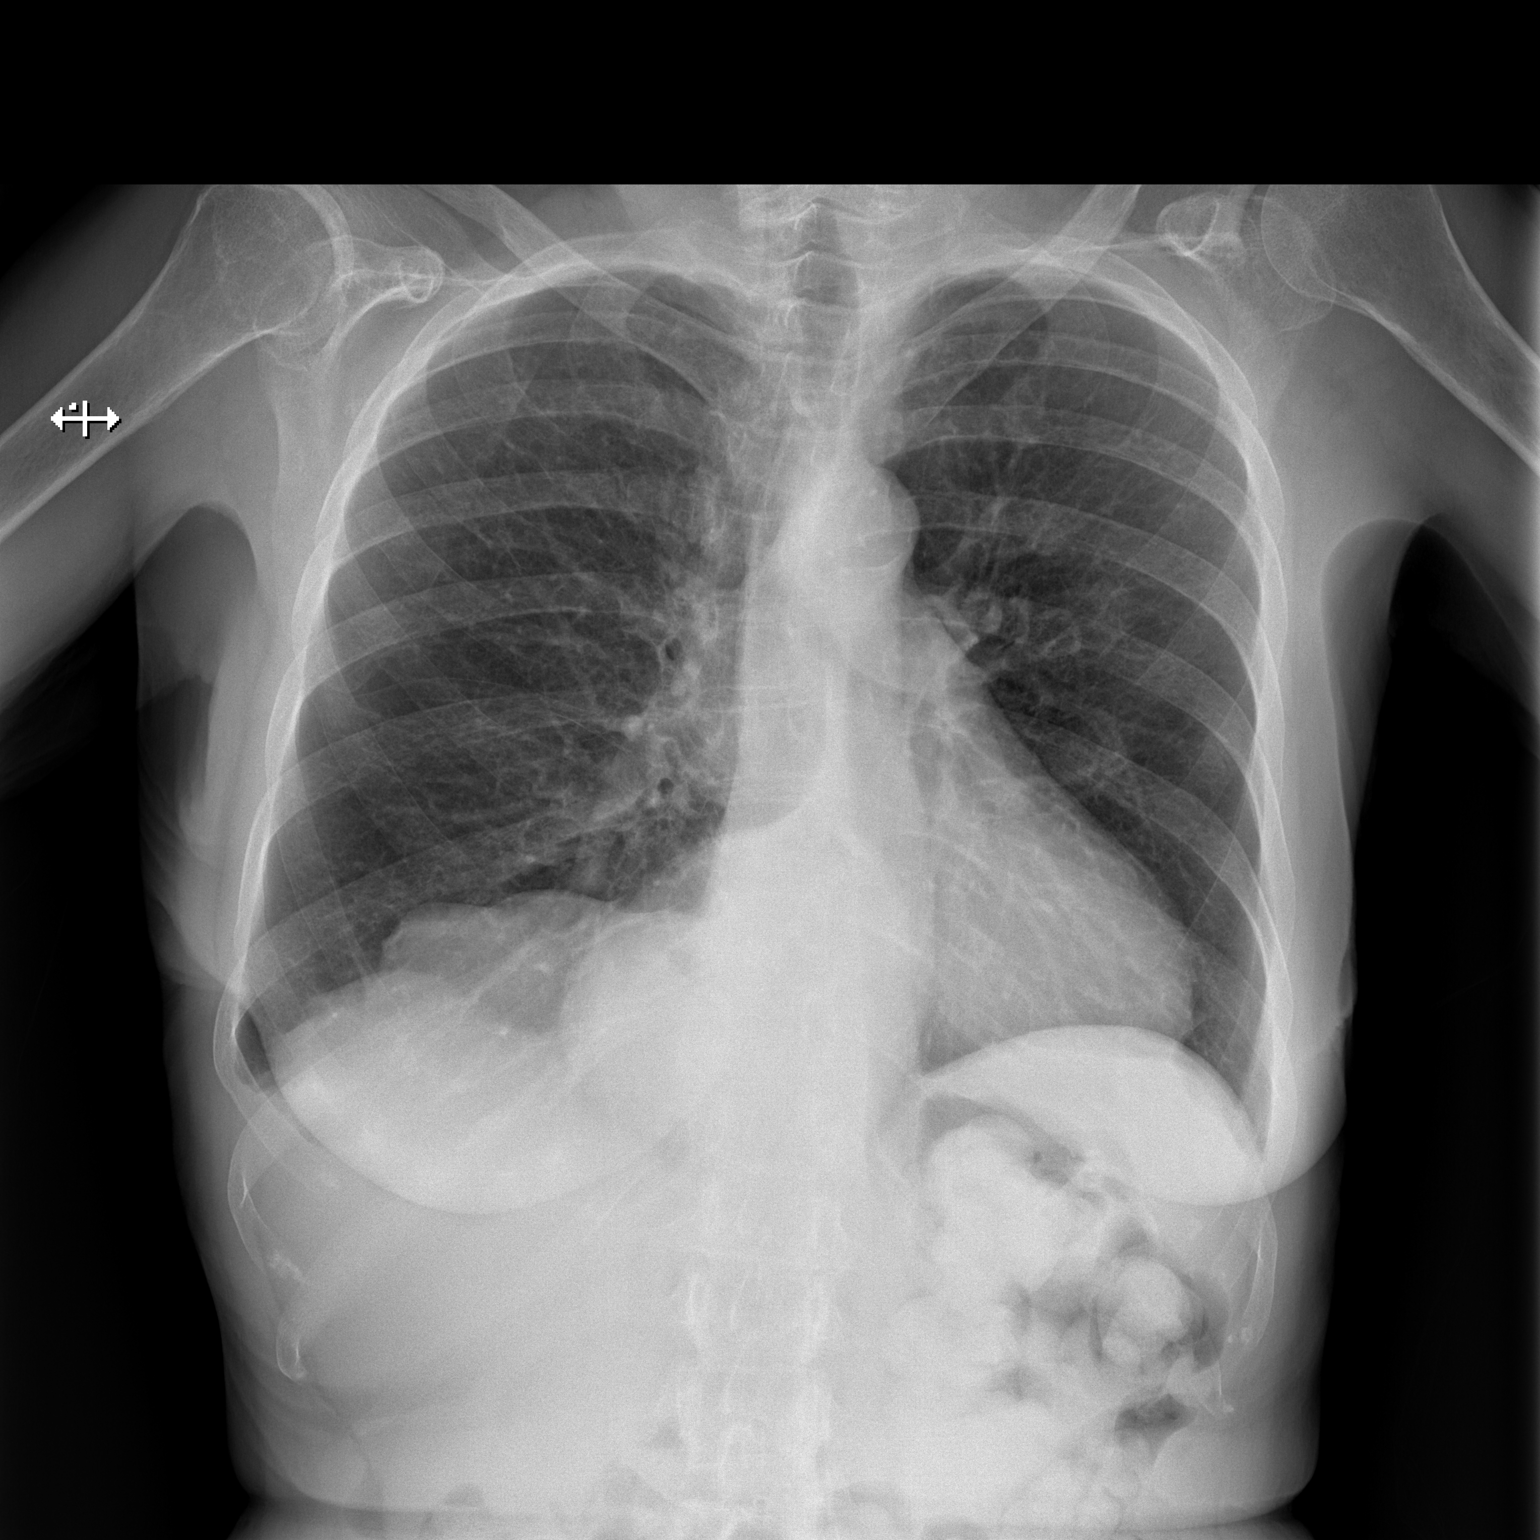

[w chest lat]
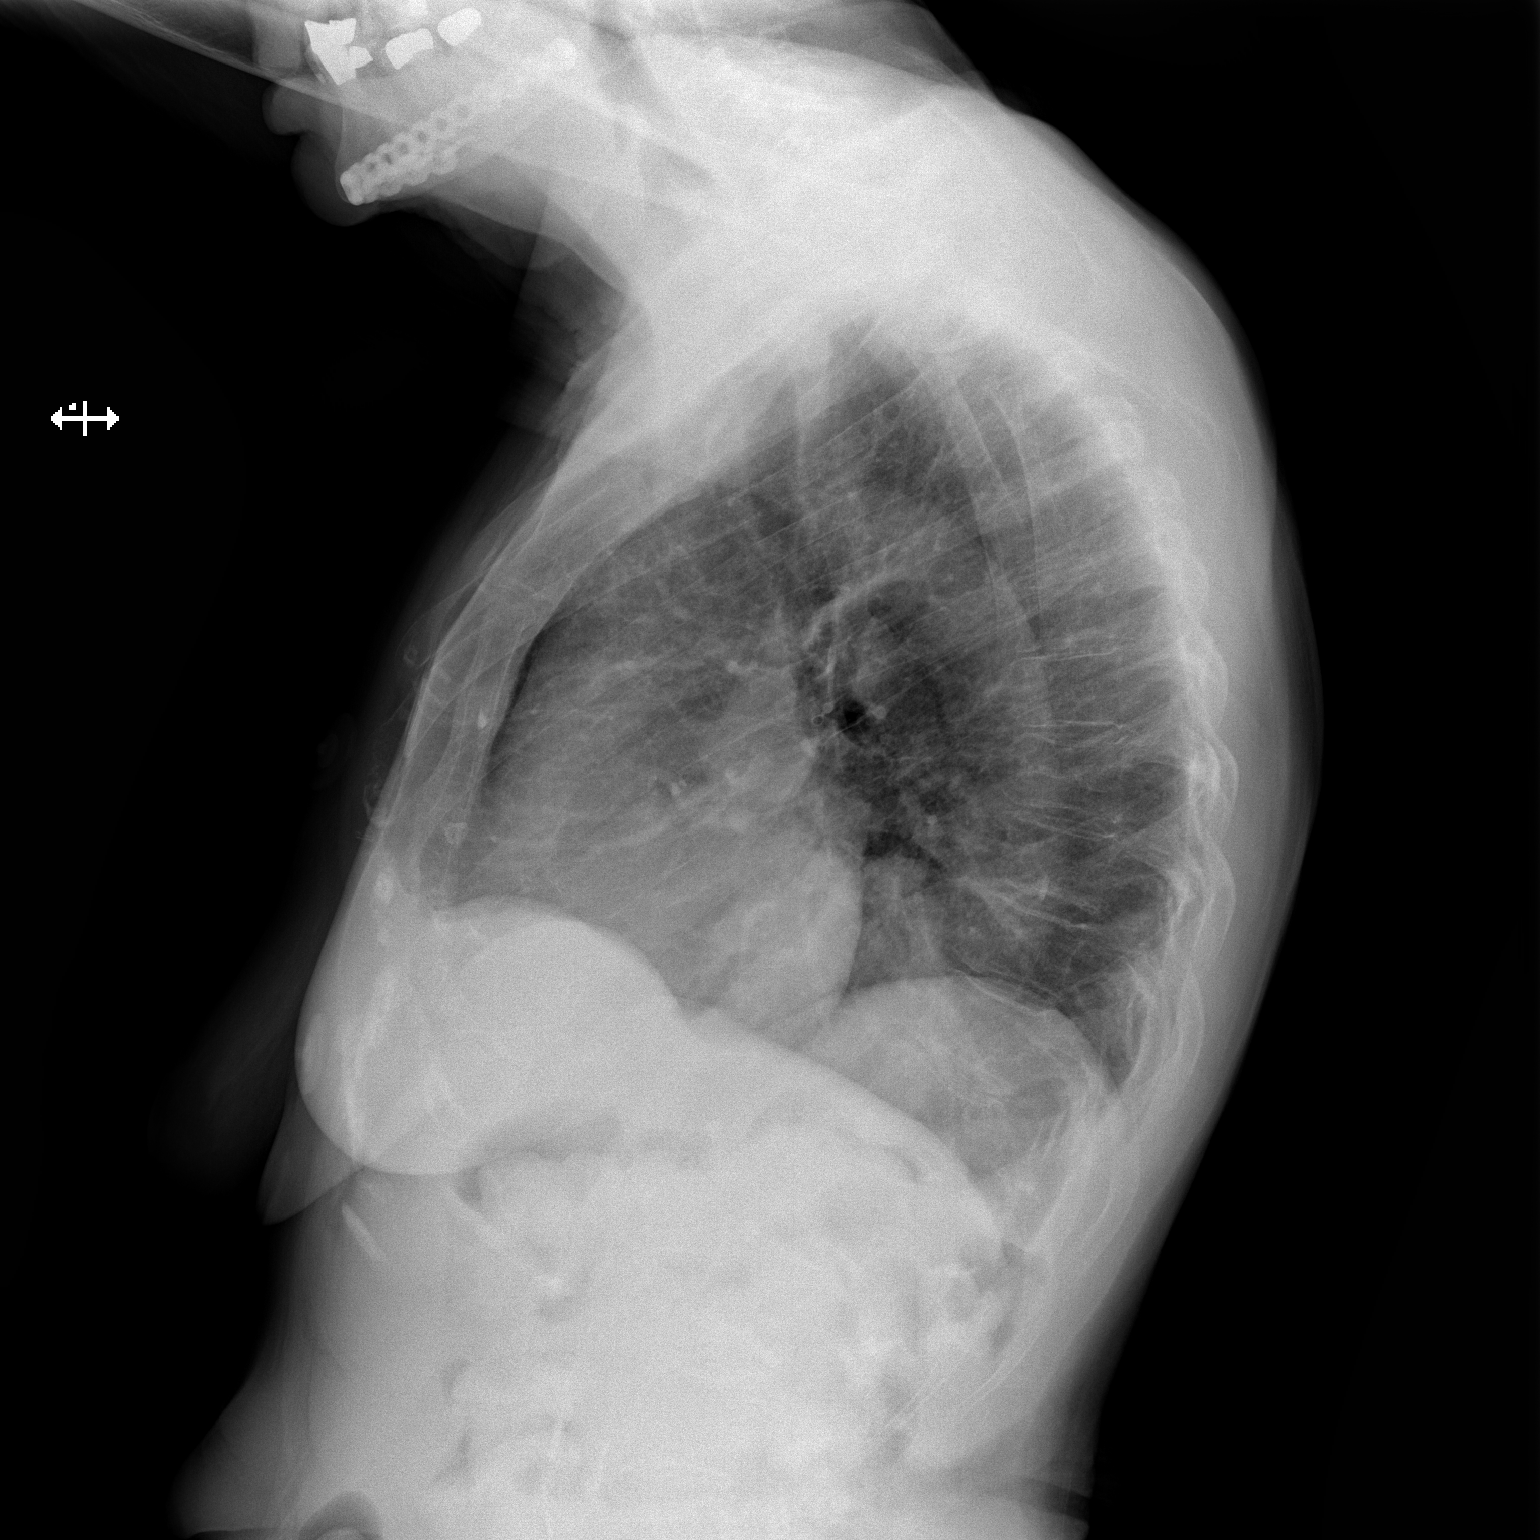

[2 of 2 positions shown; findings below may reference images not displayed]

FINDINGS: The heart size and mediastinal contours are within normal limits.
Both lungs are clear. The visualized skeletal structures are
unremarkable. Stable large hiatal hernia is noted. Stable
eventration of the posterior portion of right hemidiaphragm is noted
as well. No pneumothorax or pleural effusion is noted.
Atherosclerosis of thoracic aorta is noted.
IMPRESSION: No active cardiopulmonary disease.  Aortic atherosclerosis.

## 2017-08-07 IMAGING — CT CT HEAD W/O CM
3 of 4 series · 14 of 47 positions shown, 16 images · non-contrast
Comparison: 07/16/2016

CLINICAL DATA: Recent suicide attempt

EXAM:
CT HEAD WITHOUT CONTRAST
TECHNIQUE: Contiguous axial images were obtained from the base of the skull
through the vertex without intravenous contrast.

[Series 2: head w/o · axial · non-contrast · 0.45mm/px · z∈[+1313,+1453]mm · 8 of 34 slices shown, 10 images]
[im 3/34  brain]
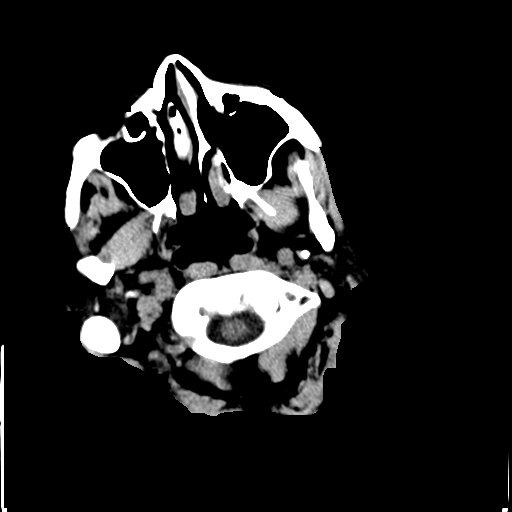
[im 3/34  bone]
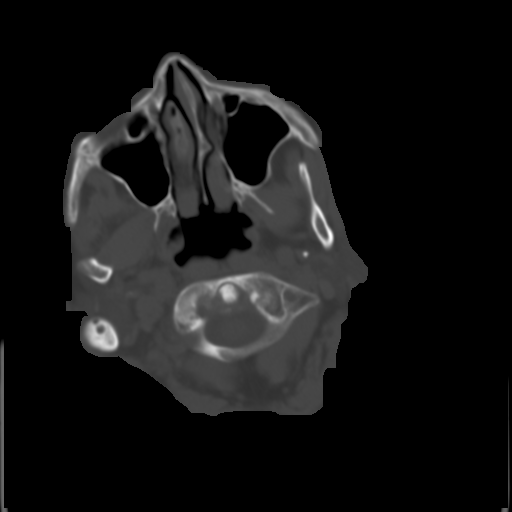
[im 8/34  brain]
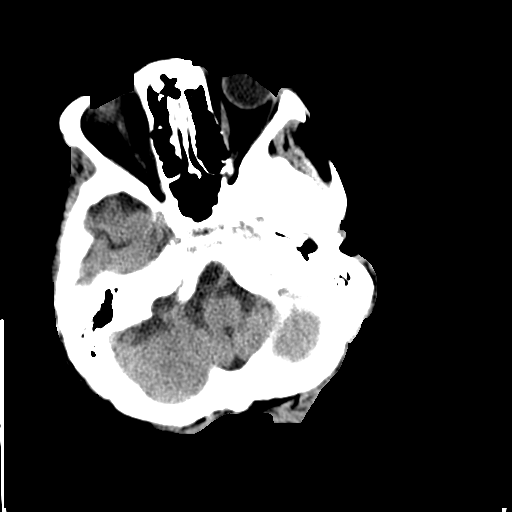
[im 12/34  brain]
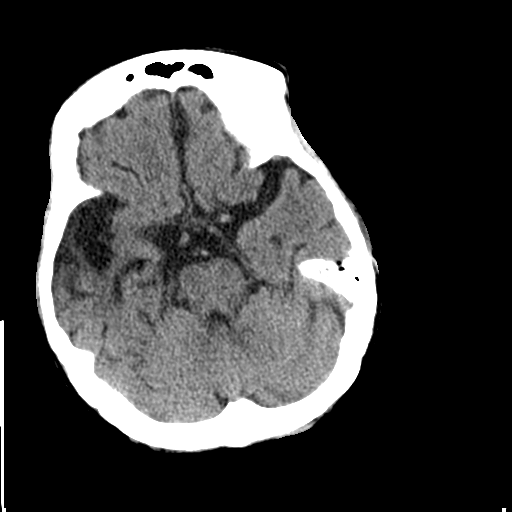
[im 15/34  brain]
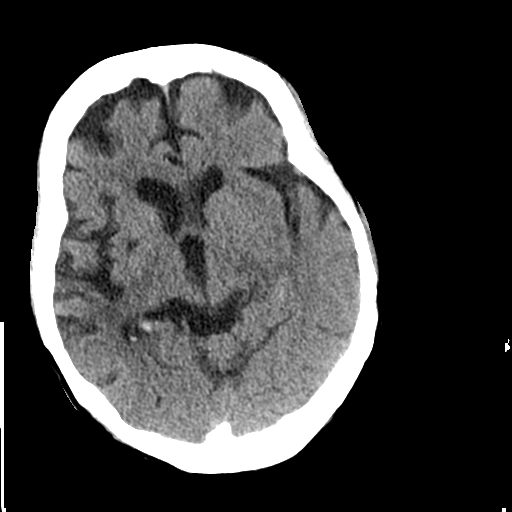
[im 19/34  brain]
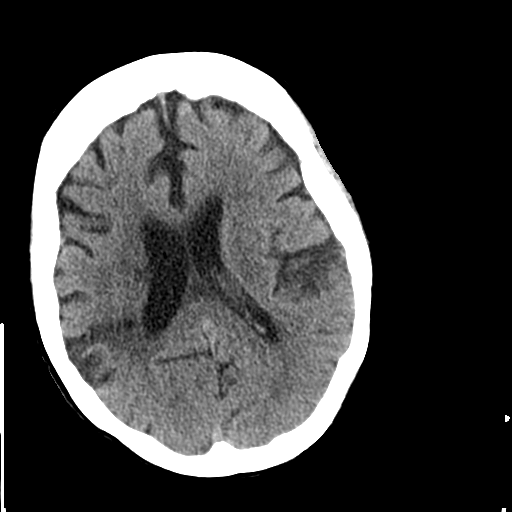
[im 19/34  bone]
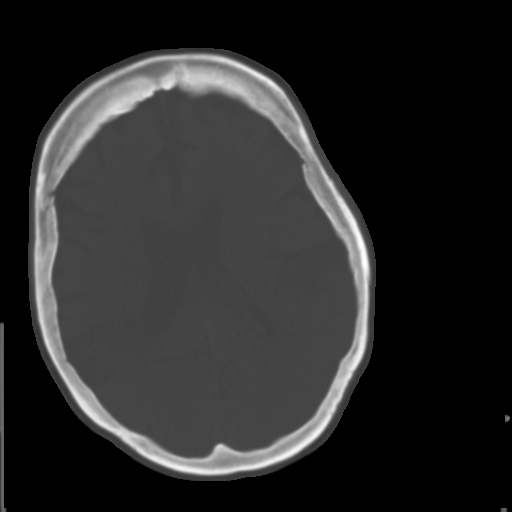
[im 22/34  brain]
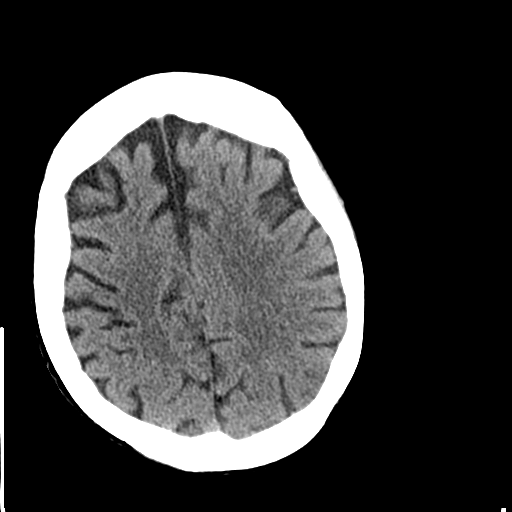
[im 26/34  brain]
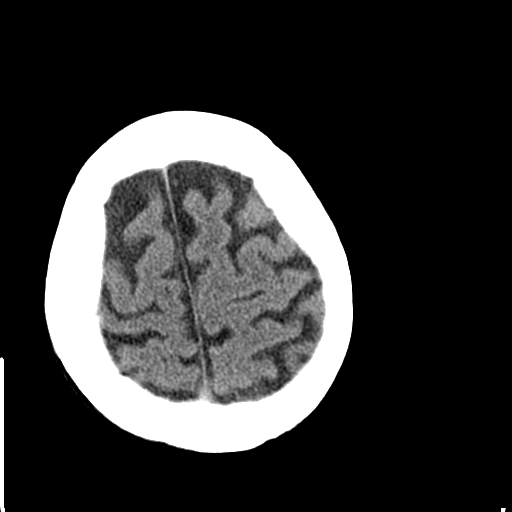
[im 31/34  brain]
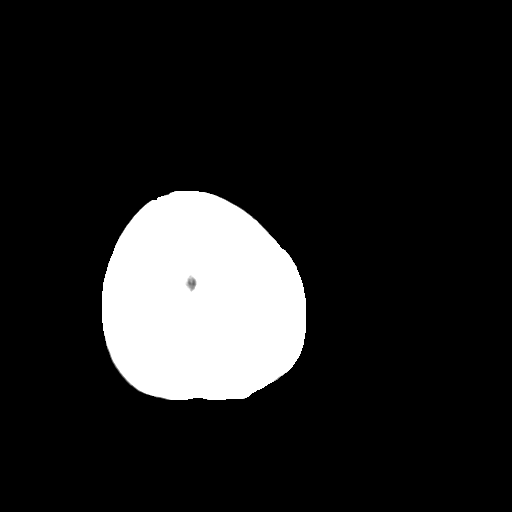

[Series 4: coronal · coronal · 0.32mm/px · 3 of 77 slices shown]
[im 26/77  brain]
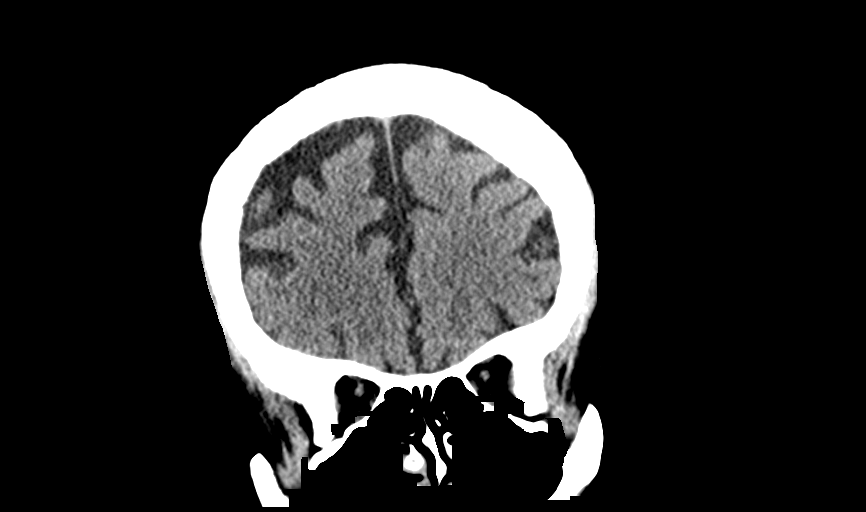
[im 34/77  brain]
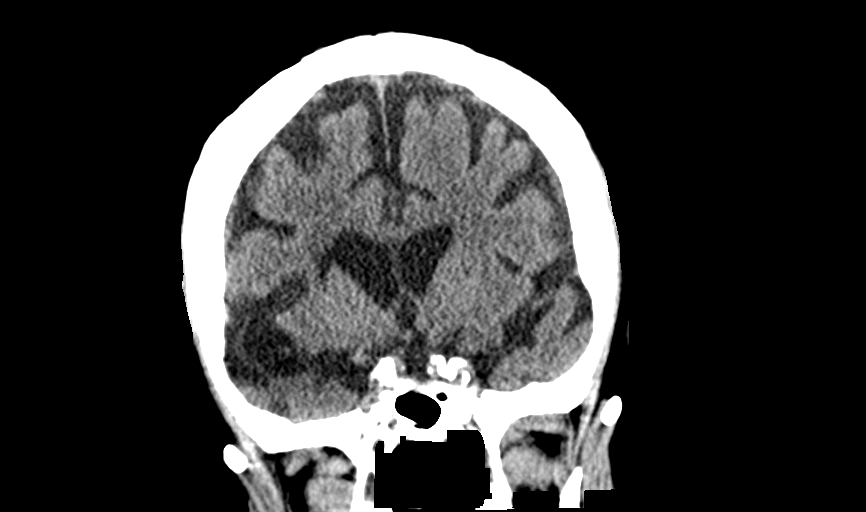
[im 43/77  brain]
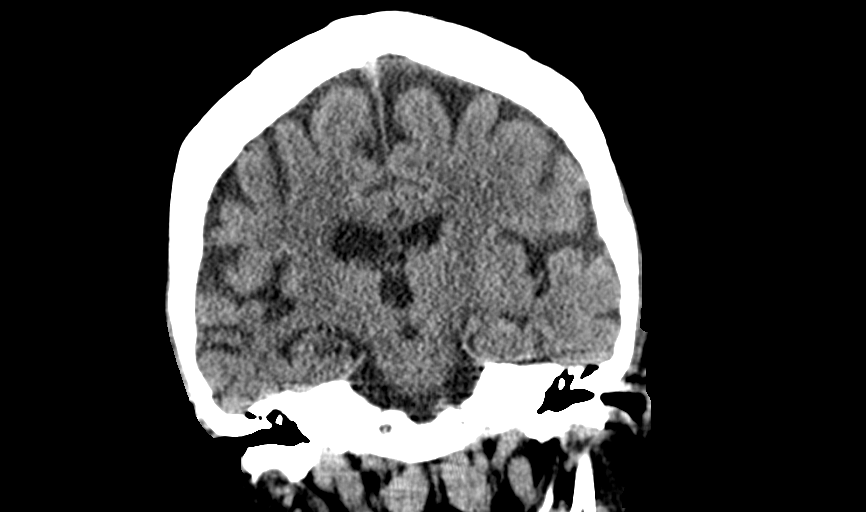

[Series 5: sagittal · sagittal · 0.32mm/px · 3 of 77 slices shown]
[im 26/77  brain]
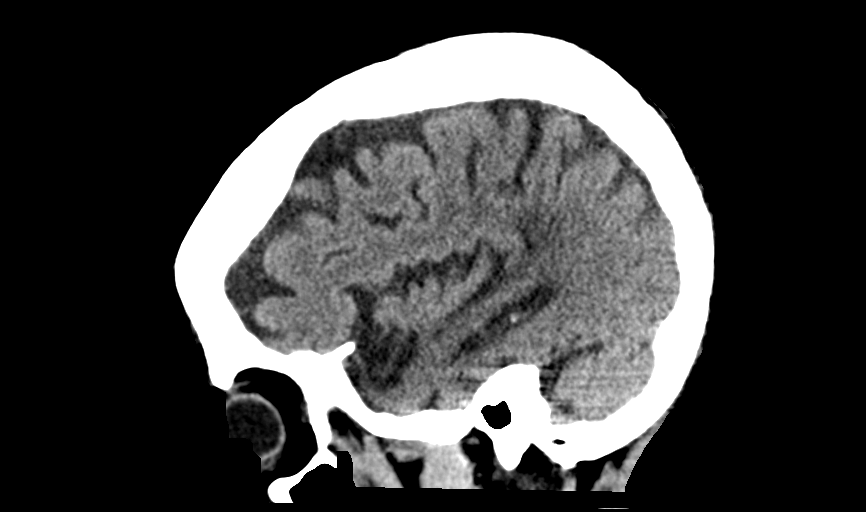
[im 39/77  brain]
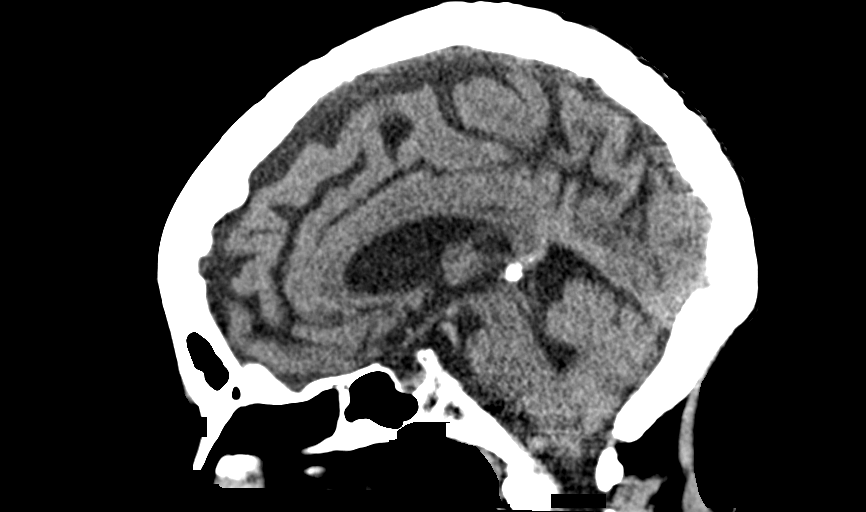
[im 51/77  brain]
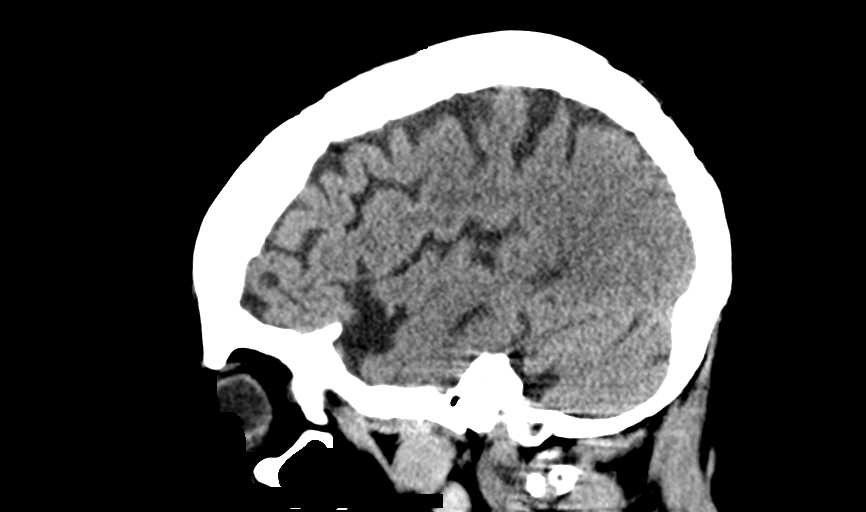

[14 of 47 positions shown; findings below may reference images not displayed]

FINDINGS: Brain: Mild atrophic changes are noted. Areas of lacunar infarcts
are noted in the right basal ganglia stable from the prior exam.
There is also some volume loss in the right temporal lobe likely
related to prior infarct. No acute hemorrhage or acute infarct is
noted.

Vascular: No hyperdense vessel or unexpected calcification.

Skull: Normal. Negative for fracture or focal lesion.

Sinuses/Orbits: No acute finding.

Other: Previously seen soft tissue hematoma has resolved in the
interval.
IMPRESSION: Chronic changes without acute abnormality.

## 2017-08-12 DIAGNOSIS — D165 Benign neoplasm of lower jaw bone: Secondary | ICD-10-CM | POA: Diagnosis not present

## 2017-08-15 DIAGNOSIS — R221 Localized swelling, mass and lump, neck: Secondary | ICD-10-CM | POA: Diagnosis not present

## 2017-08-15 DIAGNOSIS — Z9889 Other specified postprocedural states: Secondary | ICD-10-CM | POA: Diagnosis not present

## 2017-08-17 DIAGNOSIS — I10 Essential (primary) hypertension: Secondary | ICD-10-CM | POA: Diagnosis not present

## 2017-08-17 DIAGNOSIS — F322 Major depressive disorder, single episode, severe without psychotic features: Secondary | ICD-10-CM | POA: Diagnosis not present

## 2017-08-17 DIAGNOSIS — I639 Cerebral infarction, unspecified: Secondary | ICD-10-CM | POA: Diagnosis not present

## 2017-08-17 DIAGNOSIS — E039 Hypothyroidism, unspecified: Secondary | ICD-10-CM | POA: Diagnosis not present

## 2017-08-19 DIAGNOSIS — H35412 Lattice degeneration of retina, left eye: Secondary | ICD-10-CM | POA: Diagnosis not present

## 2017-08-19 DIAGNOSIS — H35072 Retinal telangiectasis, left eye: Secondary | ICD-10-CM | POA: Diagnosis not present

## 2017-08-19 DIAGNOSIS — H348322 Tributary (branch) retinal vein occlusion, left eye, stable: Secondary | ICD-10-CM | POA: Diagnosis not present

## 2017-08-19 DIAGNOSIS — H43812 Vitreous degeneration, left eye: Secondary | ICD-10-CM | POA: Diagnosis not present

## 2017-08-19 DIAGNOSIS — H35342 Macular cyst, hole, or pseudohole, left eye: Secondary | ICD-10-CM | POA: Diagnosis not present

## 2017-08-19 DIAGNOSIS — H35372 Puckering of macula, left eye: Secondary | ICD-10-CM | POA: Diagnosis not present

## 2017-09-03 DIAGNOSIS — L578 Other skin changes due to chronic exposure to nonionizing radiation: Secondary | ICD-10-CM | POA: Diagnosis not present

## 2017-09-03 DIAGNOSIS — B9689 Other specified bacterial agents as the cause of diseases classified elsewhere: Secondary | ICD-10-CM | POA: Diagnosis not present

## 2017-09-03 DIAGNOSIS — K121 Other forms of stomatitis: Secondary | ICD-10-CM | POA: Diagnosis not present

## 2017-09-05 DIAGNOSIS — E039 Hypothyroidism, unspecified: Secondary | ICD-10-CM | POA: Diagnosis not present

## 2017-10-23 DIAGNOSIS — Z23 Encounter for immunization: Secondary | ICD-10-CM | POA: Diagnosis not present

## 2017-10-28 DIAGNOSIS — F3341 Major depressive disorder, recurrent, in partial remission: Secondary | ICD-10-CM | POA: Diagnosis not present

## 2017-10-30 ENCOUNTER — Telehealth: Payer: Self-pay | Admitting: Neurology

## 2017-10-30 NOTE — Telephone Encounter (Signed)
Spoke to patient's son - states her PCP completed the required FL2 form for his mother's assisted living placement.  However, he did not include her diagnosis of memory loss (ICD 10: R41.3) or abnormality of gait (ICD10: R26.9).  His PCP office informed him these would need to be added by our office.  He will bring the form by our office to have this additional information added.  Dr. Krista Blue said she will be glad to sign it.  He will be here around 8:45am Friday morning and Lovey Newcomer will help him.

## 2017-10-30 NOTE — Telephone Encounter (Signed)
Pt's son called he is wanting to get her into an assisted living/[facility]. She has a long term ins policy that will help cover the expenses.  FL2 has been filled out by PCP but he did not document anything about memory issues which is part of the criteria that has to be met with the policy. He is wanting to know if Dr Krista Blue can fill this out prior to seeing her 11/13. Please call to discuss

## 2017-10-30 NOTE — Telephone Encounter (Signed)
Noted  

## 2017-10-31 NOTE — Telephone Encounter (Signed)
Son, here and Dr. Delfina Redwood had signed the form already.  I added the 2 additional diagnoses as per below.  He will call back as needed.

## 2017-11-11 ENCOUNTER — Ambulatory Visit: Payer: Medicare Other | Admitting: Neurology

## 2017-11-11 ENCOUNTER — Ambulatory Visit (INDEPENDENT_AMBULATORY_CARE_PROVIDER_SITE_OTHER): Payer: Medicare Other | Admitting: Neurology

## 2017-11-11 ENCOUNTER — Encounter: Payer: Self-pay | Admitting: Neurology

## 2017-11-11 ENCOUNTER — Telehealth: Payer: Self-pay | Admitting: *Deleted

## 2017-11-11 ENCOUNTER — Encounter (INDEPENDENT_AMBULATORY_CARE_PROVIDER_SITE_OTHER): Payer: Self-pay

## 2017-11-11 VITALS — BP 150/86 | HR 89 | Ht 64.0 in | Wt 112.5 lb

## 2017-11-11 DIAGNOSIS — I63511 Cerebral infarction due to unspecified occlusion or stenosis of right middle cerebral artery: Secondary | ICD-10-CM

## 2017-11-11 DIAGNOSIS — F03918 Unspecified dementia, unspecified severity, with other behavioral disturbance: Secondary | ICD-10-CM | POA: Insufficient documentation

## 2017-11-11 DIAGNOSIS — F0391 Unspecified dementia with behavioral disturbance: Secondary | ICD-10-CM | POA: Diagnosis not present

## 2017-11-11 DIAGNOSIS — I6930 Unspecified sequelae of cerebral infarction: Secondary | ICD-10-CM | POA: Diagnosis not present

## 2017-11-11 MED ORDER — DONEPEZIL HCL 10 MG PO TABS: 10.0000 mg | ORAL_TABLET | Freq: Every day | ORAL | 11 refills | Status: DC

## 2017-11-11 MED ORDER — DONEPEZIL HCL 10 MG PO TABS: 10.0000 mg | ORAL_TABLET | Freq: Every day | ORAL | 11 refills | Status: AC

## 2017-11-11 NOTE — Telephone Encounter (Signed)
Pt missed her 9:30am appt but rescheduled for noon today.  States she was confused about her appt time.
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
## 2017-11-11 NOTE — Telephone Encounter (Signed)
No show - follow up appointment.

## 2017-11-27 DIAGNOSIS — F3341 Major depressive disorder, recurrent, in partial remission: Secondary | ICD-10-CM | POA: Diagnosis not present

## 2018-01-01 DIAGNOSIS — F3341 Major depressive disorder, recurrent, in partial remission: Secondary | ICD-10-CM | POA: Diagnosis not present

## 2018-02-12 DIAGNOSIS — A429 Actinomycosis, unspecified: Secondary | ICD-10-CM | POA: Diagnosis not present

## 2018-03-11 ENCOUNTER — Emergency Department (HOSPITAL_COMMUNITY): Payer: Medicare Other

## 2018-03-11 ENCOUNTER — Encounter (HOSPITAL_COMMUNITY): Payer: Self-pay

## 2018-03-11 ENCOUNTER — Emergency Department (HOSPITAL_COMMUNITY)
Admission: EM | Admit: 2018-03-11 | Discharge: 2018-03-12 | Disposition: A | Discharge status: Home / Self Care | Payer: Medicare Other | Attending: Emergency Medicine | Admitting: Emergency Medicine

## 2018-03-11 ENCOUNTER — Other Ambulatory Visit: Payer: Self-pay

## 2018-03-11 DIAGNOSIS — S0990XA Unspecified injury of head, initial encounter: Secondary | ICD-10-CM | POA: Diagnosis not present

## 2018-03-11 DIAGNOSIS — I10 Essential (primary) hypertension: Secondary | ICD-10-CM | POA: Diagnosis not present

## 2018-03-11 DIAGNOSIS — S22089A Unspecified fracture of T11-T12 vertebra, initial encounter for closed fracture: Secondary | ICD-10-CM | POA: Diagnosis not present

## 2018-03-11 DIAGNOSIS — M25562 Pain in left knee: Secondary | ICD-10-CM | POA: Diagnosis not present

## 2018-03-11 DIAGNOSIS — S199XXA Unspecified injury of neck, initial encounter: Secondary | ICD-10-CM | POA: Diagnosis not present

## 2018-03-11 DIAGNOSIS — W1811XA Fall from or off toilet without subsequent striking against object, initial encounter: Secondary | ICD-10-CM | POA: Diagnosis not present

## 2018-03-11 DIAGNOSIS — S3992XA Unspecified injury of lower back, initial encounter: Secondary | ICD-10-CM | POA: Diagnosis not present

## 2018-03-11 DIAGNOSIS — Y93E8 Activity, other personal hygiene: Secondary | ICD-10-CM | POA: Insufficient documentation

## 2018-03-11 DIAGNOSIS — M25512 Pain in left shoulder: Secondary | ICD-10-CM | POA: Diagnosis not present

## 2018-03-11 DIAGNOSIS — Z79899 Other long term (current) drug therapy: Secondary | ICD-10-CM | POA: Diagnosis not present

## 2018-03-11 DIAGNOSIS — R197 Diarrhea, unspecified: Secondary | ICD-10-CM | POA: Insufficient documentation

## 2018-03-11 DIAGNOSIS — S299XXA Unspecified injury of thorax, initial encounter: Secondary | ICD-10-CM | POA: Diagnosis not present

## 2018-03-11 DIAGNOSIS — F17210 Nicotine dependence, cigarettes, uncomplicated: Secondary | ICD-10-CM | POA: Diagnosis not present

## 2018-03-11 DIAGNOSIS — Y92012 Bathroom of single-family (private) house as the place of occurrence of the external cause: Secondary | ICD-10-CM | POA: Diagnosis not present

## 2018-03-11 DIAGNOSIS — Y999 Unspecified external cause status: Secondary | ICD-10-CM | POA: Diagnosis not present

## 2018-03-11 DIAGNOSIS — H35342 Macular cyst, hole, or pseudohole, left eye: Secondary | ICD-10-CM | POA: Diagnosis not present

## 2018-03-11 DIAGNOSIS — S4992XA Unspecified injury of left shoulder and upper arm, initial encounter: Secondary | ICD-10-CM | POA: Diagnosis not present

## 2018-03-11 DIAGNOSIS — H35371 Puckering of macula, right eye: Secondary | ICD-10-CM | POA: Diagnosis not present

## 2018-03-11 DIAGNOSIS — J449 Chronic obstructive pulmonary disease, unspecified: Secondary | ICD-10-CM | POA: Insufficient documentation

## 2018-03-11 DIAGNOSIS — S22080A Wedge compression fracture of T11-T12 vertebra, initial encounter for closed fracture: Secondary | ICD-10-CM | POA: Diagnosis not present

## 2018-03-11 DIAGNOSIS — Z961 Presence of intraocular lens: Secondary | ICD-10-CM | POA: Diagnosis not present

## 2018-03-11 DIAGNOSIS — F0390 Unspecified dementia without behavioral disturbance: Secondary | ICD-10-CM | POA: Diagnosis not present

## 2018-03-11 DIAGNOSIS — R51 Headache: Secondary | ICD-10-CM | POA: Insufficient documentation

## 2018-03-11 DIAGNOSIS — W19XXXA Unspecified fall, initial encounter: Secondary | ICD-10-CM

## 2018-03-11 DIAGNOSIS — Z043 Encounter for examination and observation following other accident: Secondary | ICD-10-CM | POA: Diagnosis present

## 2018-03-11 LAB — CBC
HEMATOCRIT: 39.2 % (ref 36.0–46.0)
HEMOGLOBIN: 13.2 g/dL (ref 12.0–15.0)
MCH: 28.9 pg (ref 26.0–34.0)
MCHC: 33.7 g/dL (ref 30.0–36.0)
MCV: 86 fL (ref 78.0–100.0)
Platelets: 156 10*3/uL (ref 150–400)
RBC: 4.56 MIL/uL (ref 3.87–5.11)
RDW: 14.9 % (ref 11.5–15.5)
WBC: 7 10*3/uL (ref 4.0–10.5)
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
## 2018-03-11 NOTE — ED Notes (Signed)
Attempted to get blood but was unsuccessful 

## 2018-03-11 NOTE — ED Provider Notes (Signed)
[facility] DEPT Provider Note   CSN: [id number] Arrival date & time: 03/11/18  2123     History   Chief Complaint Chief Complaint  Patient presents with  . Fall    HPI Kendra Hernandez is a 77 y.o. female.  HPI Patient presented to the emergency room for evaluation of injuries after a fall.  Patient states she went to the bathroom because she started to have diarrhea.  Patient fell off the commode and injured her left shoulder.  She is also complaining pain in her neck as well as her back.  Patient denies any nausea vomiting or abdominal pain.  She only started having a loose stool this evening.  She does have a headache but denies any loss of consciousness.  She denies any numbness or weakness in her extremities. Past Medical History:  Diagnosis Date  . Anxiety   . Arthritis   . COPD (chronic obstructive pulmonary disease) ([place])   . Hyperlipidemia   . Hypertension   . Hypothyroid   . Insomnia   . Reflux   . Repeated falls   . Skin cancer   . Stress   . Stroke (cerebrum) [facility])     Patient Active Problem List   Diagnosis Date Noted  . Dementia with behavioral disturbance 11/11/2017  . Chronic ischemic right MCA stroke 11/11/2017  . Alcoholism, chronic ([place]) 06/27/2017  . Isopropyl alcohol poisoning   . Major depressive disorder, recurrent episode ([place]) 12/21/2016  . Major depressive disorder, recurrent episode, severe ([place]) 11/24/2016  . Alcohol withdrawal ([place]) 07/17/2016  . Withdrawal symptoms, alcohol ([place]) 07/17/2016  . Hypothyroidism 07/17/2016  . History of CVA (cerebrovascular accident) 07/17/2016  . Head contusion 07/17/2016  . Fall 07/17/2016  . Insomnia 07/17/2016  . Essential hypertension 07/17/2016  . Abnormality of gait 01/24/2016  . Memory loss 01/24/2016  . Acute right MCA stroke ([place]) 01/09/2016  . Essential hypertension, benign 01/09/2016  . Hyperlipidemia LDL goal <100 01/09/2016  . Other specified hypothyroidism  01/09/2016  . Cervicalgia 01/09/2016  . Esophageal reflux 01/09/2016  . Major depression, chronic 01/09/2016    Past Surgical History:  Procedure Laterality Date  . COLONOSCOPY     With polyp removal   . MANDIBLE FRACTURE SURGERY     Placement of partial jaw due to abcess  . SKIN CANCER EXCISION     Removal of several skin cancers  . TOE SURGERY Right    Removal of toe     OB History    No data available       Home Medications    Prior to Admission medications   Medication Sig Start Date End Date Taking? Authorizing Provider  acetaminophen (TYLENOL) 500 MG tablet Take 1,000 mg by mouth every 8 (eight) hours as needed for mild pain.   Yes [provider]  amLODipine (NORVASC) 10 MG tablet Take 10 mg by mouth daily.   Yes [provider]  atorvastatin (LIPITOR) 20 MG tablet Take 20 mg by mouth daily.   Yes [provider]  bisacodyl (DULCOLAX) 5 MG EC tablet Take 15 mg by mouth as directed. 3 tablets one time daily every Wednesday   Yes [provider]  donepezil (ARICEPT) 10 MG tablet Take 1 tablet (10 mg total) daily by mouth. After meal 11/11/17  Yes Marcial Pacas, MD  doxycycline (VIBRAMYCIN) 100 MG capsule Take 100 mg 2 (two) times daily by mouth.   Yes [provider]  hydrALAZINE (APRESOLINE) 50 MG tablet Take  50 mg by mouth 2 (two) times daily.   Yes [provider]  hydrOXYzine (ATARAX/VISTARIL) 50 MG tablet Take 1 tablet by mouth every 12 (twelve) hours as needed for anxiety.  06/24/17  Yes [provider]  labetalol (NORMODYNE) 200 MG tablet Take 200 mg by mouth 2 (two) times daily.    Yes [provider]  levothyroxine (SYNTHROID, LEVOTHROID) 25 MCG tablet Take 25 mcg by mouth daily before breakfast.   Yes [provider]  loratadine (CLARITIN) 10 MG tablet Take 10 mg by mouth daily.   Yes [provider]  omeprazole (PRILOSEC) 40 MG capsule Take 40 mg by mouth daily.   Yes [provider]  QUEtiapine (SEROQUEL) 25 MG tablet Take 25 mg by mouth daily.    Yes [provider]  traZODone (DESYREL) 100 MG tablet Take 200 mg by mouth at bedtime.    Yes [provider]  venlafaxine XR (EFFEXOR-XR) 75 MG 24 hr capsule Take 225 mg by mouth daily with breakfast.   Yes [provider]    Family History Family History  Problem Relation Age of Onset  . Emphysema Mother   . Rheumatologic disease Mother   . Heart attack Father   . Emphysema Brother   . Pancreatic cancer Daughter     Social History Social History   Tobacco Use  . Smoking status: Current Every Day Smoker    Packs/day: 2.00    Years: 30.00    Pack years: 60.00    Types: Cigarettes  . Smokeless tobacco: Never Used  Substance Use Topics  . Alcohol use: Yes    Alcohol/week: 0.0 oz    Comment: last drink 2 wks ago 11-05-16, but is uncontrollable.  . Drug use: No     Allergies   Ceftin [cefuroxime axetil]; Penicillins; and Simvastatin   Review of Systems Review of Systems  All other systems reviewed and are negative.    Physical Exam Updated Vital Signs BP 128/62 (BP Location: Right Arm)   Pulse 71   Temp 97.9 F (36.6 C) (Oral)   Resp 13   Ht 1.575 m (5\' 2" )   Wt 50.8 kg (112 lb)   SpO2 96%   BMI 20.49 kg/m   Physical Exam  Constitutional: No distress.  Elderly, frail  HENT:  Head: Normocephalic and atraumatic.  Right Ear: External ear normal.  Left Ear: External ear normal.  Eyes: Conjunctivae are normal. Right eye exhibits no discharge. Left eye exhibits no discharge. No scleral icterus.  Neck: Neck supple. No tracheal deviation present.  Cardiovascular: Normal rate, regular rhythm and intact distal pulses.  Pulmonary/Chest: Effort normal and breath sounds normal. No stridor. No respiratory distress. She has no wheezes. She has no rales.  Abdominal: Soft. Bowel sounds are normal. She exhibits no distension. There is no tenderness. There is no  rebound and no guarding.  Musculoskeletal: She exhibits no edema.       Right shoulder: Normal.       Left shoulder: She exhibits tenderness.       Right wrist: Normal.       Left wrist: Normal.       Right hip: Normal.       Left hip: She exhibits tenderness.       Right knee: Normal.       Left knee: Normal.       Right ankle: Normal.       Left ankle: Normal.  Cervical back: She exhibits tenderness.       Thoracic back: She exhibits tenderness.       Lumbar back: She exhibits tenderness.  Neurological: She is alert. She has normal strength. No cranial nerve deficit (no facial droop, extraocular movements intact, no slurred speech) or sensory deficit. She exhibits normal muscle tone. She displays no seizure activity. Coordination normal.  Skin: Skin is warm and dry. No rash noted.  Psychiatric: She has a normal mood and affect.  Nursing note and vitals reviewed.    ED Treatments / Results  Labs (all labs ordered are listed, but only abnormal results are displayed) Labs Reviewed  BASIC METABOLIC PANEL - Abnormal; Notable for the following components:      Result Value   Potassium 3.1 (*)    Glucose, Bld 122 (*)    All other components within normal limits  CBC    EKG  EKG Interpretation  Date/Time:  Wednesday March 11 2018 21:39:39 EDT Ventricular Rate:  71 PR Interval:    QRS Duration: 121 QT Interval:  467 QTC Calculation: 508 R Axis:   56 Text Interpretation:  Sinus rhythm Nonspecific intraventricular conduction delay Borderline repolarization abnormality No significant change since last tracing Confirmed by Dorie Rank [phone]) on 03/11/2018 10:15:39 PM       Radiology Dg Thoracic Spine 2 View  Result Date: 03/11/2018 CLINICAL DATA:  Fall EXAM: THORACIC SPINE 2 VIEWS COMPARISON:  None. FINDINGS: Poorly visualized mild compression deformity of 1 of the upper thoracic vertebral bodies, probably T4. No other evidence of thoracic spine fracture. Alignment is  normal. IMPRESSION: Poorly visualized mild compression deformity of an upper thoracic vertebral body, possibly T4. This is age indeterminate and could be further characterized with thoracic spine CT or MRI. Electronically Signed   By: Ulyses Jarred M.D.   On: 03/11/2018 23:10   Dg Lumbar Spine 2-3 Views  Result Date: 03/11/2018 CLINICAL DATA:  Fall EXAM: LUMBAR SPINE - 2-3 VIEW COMPARISON:  CT abdomen pelvis 10/25/2016 FINDINGS: Grade 1 anterolisthesis at L4-L5, unchanged. This is due to facet hypertrophy. No fracture. No other abnormal alignment. Aortic atherosclerosis. IMPRESSION: No acute fracture of the lumbar spine. Unchanged degenerative grade 1 L4-5 anterolisthesis. Electronically Signed   By: Ulyses Jarred M.D.   On: 03/11/2018 23:12   Ct Head Wo Contrast  Result Date: 03/11/2018 CLINICAL DATA:  Fall with head trauma EXAM: CT HEAD WITHOUT CONTRAST CT CERVICAL SPINE WITHOUT CONTRAST TECHNIQUE: Multidetector CT imaging of the head and cervical spine was performed following the standard protocol without intravenous contrast. Multiplanar CT image reconstructions of the cervical spine were also generated. COMPARISON:  Head CT 11/23/2016 Cervical spine radiograph 06/18/2017 Cervical spine CT 07/16/2016 FINDINGS: CT HEAD FINDINGS Brain: No mass lesion, intraparenchymal hemorrhage or extra-axial collection. No evidence of acute cortical infarct. Old right gangliocapsular lacunar infarct with ex vacuo dilatation of the right lateral ventricle. Anterior right temporal lobe encephalomalacia. There is periventricular hypoattenuation compatible with chronic microvascular disease. Vascular: Atherosclerotic calcification of the vertebral and internal carotid arteries at the skull base. Skull: No calvarial or skull base fracture. Sinuses/Orbits: No sinus fluid levels or advanced mucosal thickening. No mastoid effusion. Normal orbits. CT CERVICAL SPINE FINDINGS Alignment: Unchanged degenerative grade 1  anterolisthesis at C4-C5 due to bilateral facet hypertrophy. Otherwise normal alignment. Skull base and vertebrae: Age indeterminate superior endplate fracture of T1, new compared to 07/16/2016. Approximately 25% height loss. No other fracture. Soft tissues and spinal canal: No prevertebral fluid or swelling. No visible  canal hematoma. Disc levels: No advanced spinal canal or neural foraminal stenosis. Upper chest: No pneumothorax, pulmonary nodule or pleural effusion. Other: Normal visualized paraspinal cervical soft tissues. IMPRESSION: 1. Chronic ischemic microangiopathy and old right basal ganglia and temporal lobe infarcts without acute intracranial abnormality. 2. No skull fracture. 3. Age indeterminate wedge compression fracture of T1 involving the superior endplate with approximately 25% height loss. This is new compared to 07/16/2016, but the age is otherwise on node. MRI might be helpful for further temporal characterization. 4. Unchanged chronic degenerative grade 1 C4-5 anterolisthesis due to facet arthrosis. Electronically Signed   By: Ulyses Jarred M.D.   On: 03/11/2018 23:00   Ct Cervical Spine Wo Contrast  Result Date: 03/11/2018 CLINICAL DATA:  Fall with head trauma EXAM: CT HEAD WITHOUT CONTRAST CT CERVICAL SPINE WITHOUT CONTRAST TECHNIQUE: Multidetector CT imaging of the head and cervical spine was performed following the standard protocol without intravenous contrast. Multiplanar CT image reconstructions of the cervical spine were also generated. COMPARISON:  Head CT 11/23/2016 Cervical spine radiograph 06/18/2017 Cervical spine CT 07/16/2016 FINDINGS: CT HEAD FINDINGS Brain: No mass lesion, intraparenchymal hemorrhage or extra-axial collection. No evidence of acute cortical infarct. Old right gangliocapsular lacunar infarct with ex vacuo dilatation of the right lateral ventricle. Anterior right temporal lobe encephalomalacia. There is periventricular hypoattenuation compatible with chronic  microvascular disease. Vascular: Atherosclerotic calcification of the vertebral and internal carotid arteries at the skull base. Skull: No calvarial or skull base fracture. Sinuses/Orbits: No sinus fluid levels or advanced mucosal thickening. No mastoid effusion. Normal orbits. CT CERVICAL SPINE FINDINGS Alignment: Unchanged degenerative grade 1 anterolisthesis at C4-C5 due to bilateral facet hypertrophy. Otherwise normal alignment. Skull base and vertebrae: Age indeterminate superior endplate fracture of T1, new compared to 07/16/2016. Approximately 25% height loss. No other fracture. Soft tissues and spinal canal: No prevertebral fluid or swelling. No visible canal hematoma. Disc levels: No advanced spinal canal or neural foraminal stenosis. Upper chest: No pneumothorax, pulmonary nodule or pleural effusion. Other: Normal visualized paraspinal cervical soft tissues. IMPRESSION: 1. Chronic ischemic microangiopathy and old right basal ganglia and temporal lobe infarcts without acute intracranial abnormality. 2. No skull fracture. 3. Age indeterminate wedge compression fracture of T1 involving the superior endplate with approximately 25% height loss. This is new compared to 07/16/2016, but the age is otherwise on node. MRI might be helpful for further temporal characterization. 4. Unchanged chronic degenerative grade 1 C4-5 anterolisthesis due to facet arthrosis. Electronically Signed   By: Ulyses Jarred M.D.   On: 03/11/2018 23:00   Dg Shoulder Left  Result Date: 03/11/2018 CLINICAL DATA:  77 year old female with fall and left shoulder pain. EXAM: LEFT SHOULDER - 2+ VIEW COMPARISON:  Chest radiograph dated 06/18/2017 FINDINGS: There is no acute fracture or dislocation. The bones are osteopenic. There is elevation of the left humeral head suggestive of chronic rotator cuff injury. The soft tissues are grossly unremarkable. Atherosclerotic calcification of the aortic arch. IMPRESSION: No acute fracture or  dislocation. Electronically Signed   By: Anner Crete M.D.   On: 03/11/2018 23:10    Procedures Procedures (including critical care time)  Medications Ordered in ED Medications  HYDROcodone-acetaminophen (NORCO/VICODIN) 5-325 MG per tablet 1 tablet (not administered)     Initial Impression / Assessment and Plan / ED Course  I have reviewed the triage vital signs and the nursing notes.  Pertinent labs & imaging results that were available during my care of the patient were reviewed by me and considered in  my medical decision making (see chart for details).   Patient presented to the emergency room for evaluation after falling to the commode.   Patient has no deformity on exam.  X-rays do not show any signs of any fracture or dislocation with the exception of a possible T12 compression fracture.  It is unclear if this is a new injury as the patient's is tender rather diffusely and not specifically at that location.  No evidence of any neuro  Deficits.  Laboratory tests are otherwise reassuring.  Patient appears stable to discharge to her nursing facility  Final Clinical Impressions(s) / ED Diagnoses   Final diagnoses:  T12 compression fracture [facility])  Fall, initial encounter    ED Discharge Orders    None       Dorie Rank, MD 03/12/18 0011

## 2018-03-11 NOTE — ED Notes (Signed)
Bed: WA17 Expected date:  Expected time:  Means of arrival:  Comments: 

## 2018-03-12 DIAGNOSIS — S22080A Wedge compression fracture of T11-T12 vertebra, initial encounter for closed fracture: Secondary | ICD-10-CM | POA: Diagnosis not present

## 2018-03-12 DIAGNOSIS — S0990XA Unspecified injury of head, initial encounter: Secondary | ICD-10-CM | POA: Diagnosis not present

## 2018-03-12 DIAGNOSIS — G8911 Acute pain due to trauma: Secondary | ICD-10-CM | POA: Diagnosis not present

## 2018-03-12 LAB — BASIC METABOLIC PANEL
ANION GAP: 10 (ref 5–15)
BUN: 18 mg/dL (ref 6–20)
CALCIUM: 9.7 mg/dL (ref 8.9–10.3)
CO2: 25 mmol/L (ref 22–32)
CREATININE: 0.53 mg/dL (ref 0.44–1.00)
Chloride: 106 mmol/L (ref 101–111)
GLUCOSE: 122 mg/dL — AB (ref 65–99)
Potassium: 3.1 mmol/L — ABNORMAL LOW (ref 3.5–5.1)
Sodium: 141 mmol/L (ref 135–145)

## 2018-03-12 MED ORDER — HYDROCODONE-ACETAMINOPHEN 5-325 MG PO TABS
1.0000 | ORAL_TABLET | ORAL | Status: AC
  Administered 2018-03-12: 1 via ORAL
  Filled 2018-03-12: qty 1

## 2018-03-12 NOTE — ED Notes (Signed)
Report called to Tamela Oddi, [facility] at [facility] called for transport.

## 2018-03-12 NOTE — Discharge Instructions (Signed)
Follow up with your primary care doctor, take over the counter medications as needed for pain

## 2018-04-28 DIAGNOSIS — E7800 Pure hypercholesterolemia, unspecified: Secondary | ICD-10-CM | POA: Diagnosis not present

## 2018-04-28 DIAGNOSIS — R197 Diarrhea, unspecified: Secondary | ICD-10-CM | POA: Diagnosis not present

## 2018-04-28 DIAGNOSIS — F322 Major depressive disorder, single episode, severe without psychotic features: Secondary | ICD-10-CM | POA: Diagnosis not present

## 2018-04-28 DIAGNOSIS — E039 Hypothyroidism, unspecified: Secondary | ICD-10-CM | POA: Diagnosis not present

## 2018-04-28 DIAGNOSIS — F17210 Nicotine dependence, cigarettes, uncomplicated: Secondary | ICD-10-CM | POA: Diagnosis not present

## 2018-04-28 DIAGNOSIS — I10 Essential (primary) hypertension: Secondary | ICD-10-CM | POA: Diagnosis not present

## 2018-04-28 DIAGNOSIS — I639 Cerebral infarction, unspecified: Secondary | ICD-10-CM | POA: Diagnosis not present

## 2018-05-07 DIAGNOSIS — I10 Essential (primary) hypertension: Secondary | ICD-10-CM | POA: Diagnosis not present

## 2018-05-07 DIAGNOSIS — R946 Abnormal results of thyroid function studies: Secondary | ICD-10-CM | POA: Diagnosis not present

## 2018-05-11 ENCOUNTER — Ambulatory Visit: Payer: Medicare Other | Admitting: Neurology

## 2018-05-12 ENCOUNTER — Encounter: Payer: Self-pay | Admitting: Neurology

## 2018-06-17 DIAGNOSIS — R937 Abnormal findings on diagnostic imaging of other parts of musculoskeletal system: Secondary | ICD-10-CM | POA: Diagnosis not present

## 2018-06-17 DIAGNOSIS — M7989 Other specified soft tissue disorders: Secondary | ICD-10-CM | POA: Diagnosis not present

## 2018-06-17 DIAGNOSIS — Z967 Presence of other bone and tendon implants: Secondary | ICD-10-CM | POA: Diagnosis not present

## 2018-06-19 DIAGNOSIS — R946 Abnormal results of thyroid function studies: Secondary | ICD-10-CM | POA: Diagnosis not present

## 2018-06-19 DIAGNOSIS — I10 Essential (primary) hypertension: Secondary | ICD-10-CM | POA: Diagnosis not present

## 2018-07-16 DIAGNOSIS — E039 Hypothyroidism, unspecified: Secondary | ICD-10-CM | POA: Diagnosis not present

## 2018-08-08 ENCOUNTER — Emergency Department (HOSPITAL_COMMUNITY): Payer: Medicare Other

## 2018-08-08 ENCOUNTER — Encounter (HOSPITAL_COMMUNITY): Payer: Self-pay | Admitting: *Deleted

## 2018-08-08 ENCOUNTER — Other Ambulatory Visit: Payer: Self-pay

## 2018-08-08 ENCOUNTER — Emergency Department (HOSPITAL_COMMUNITY)
Admission: EM | Admit: 2018-08-08 | Discharge: 2018-08-08 | Disposition: A | Discharge status: Home / Self Care | Payer: Medicare Other | Attending: Emergency Medicine | Admitting: Emergency Medicine

## 2018-08-08 DIAGNOSIS — S199XXA Unspecified injury of neck, initial encounter: Secondary | ICD-10-CM | POA: Diagnosis not present

## 2018-08-08 DIAGNOSIS — M542 Cervicalgia: Secondary | ICD-10-CM

## 2018-08-08 DIAGNOSIS — R079 Chest pain, unspecified: Secondary | ICD-10-CM | POA: Diagnosis not present

## 2018-08-08 DIAGNOSIS — W19XXXA Unspecified fall, initial encounter: Secondary | ICD-10-CM

## 2018-08-08 DIAGNOSIS — E039 Hypothyroidism, unspecified: Secondary | ICD-10-CM | POA: Diagnosis not present

## 2018-08-08 DIAGNOSIS — J449 Chronic obstructive pulmonary disease, unspecified: Secondary | ICD-10-CM | POA: Diagnosis not present

## 2018-08-08 DIAGNOSIS — M545 Low back pain, unspecified: Secondary | ICD-10-CM

## 2018-08-08 DIAGNOSIS — Y9389 Activity, other specified: Secondary | ICD-10-CM | POA: Insufficient documentation

## 2018-08-08 DIAGNOSIS — F17210 Nicotine dependence, cigarettes, uncomplicated: Secondary | ICD-10-CM | POA: Insufficient documentation

## 2018-08-08 DIAGNOSIS — F0390 Unspecified dementia without behavioral disturbance: Secondary | ICD-10-CM | POA: Diagnosis not present

## 2018-08-08 DIAGNOSIS — Y999 Unspecified external cause status: Secondary | ICD-10-CM | POA: Diagnosis not present

## 2018-08-08 DIAGNOSIS — W1839XA Other fall on same level, initial encounter: Secondary | ICD-10-CM | POA: Insufficient documentation

## 2018-08-08 DIAGNOSIS — Z85828 Personal history of other malignant neoplasm of skin: Secondary | ICD-10-CM | POA: Diagnosis not present

## 2018-08-08 DIAGNOSIS — S299XXA Unspecified injury of thorax, initial encounter: Secondary | ICD-10-CM | POA: Diagnosis not present

## 2018-08-08 DIAGNOSIS — S3992XA Unspecified injury of lower back, initial encounter: Secondary | ICD-10-CM | POA: Diagnosis not present

## 2018-08-08 DIAGNOSIS — M5489 Other dorsalgia: Secondary | ICD-10-CM | POA: Diagnosis not present

## 2018-08-08 DIAGNOSIS — Z79899 Other long term (current) drug therapy: Secondary | ICD-10-CM | POA: Diagnosis not present

## 2018-08-08 DIAGNOSIS — T148XXA Other injury of unspecified body region, initial encounter: Secondary | ICD-10-CM | POA: Diagnosis not present

## 2018-08-08 DIAGNOSIS — I10 Essential (primary) hypertension: Secondary | ICD-10-CM | POA: Diagnosis not present

## 2018-08-08 DIAGNOSIS — Y92129 Unspecified place in nursing home as the place of occurrence of the external cause: Secondary | ICD-10-CM | POA: Diagnosis not present

## 2018-08-08 DIAGNOSIS — Z8673 Personal history of transient ischemic attack (TIA), and cerebral infarction without residual deficits: Secondary | ICD-10-CM | POA: Diagnosis not present

## 2018-08-08 DIAGNOSIS — S0990XA Unspecified injury of head, initial encounter: Secondary | ICD-10-CM | POA: Diagnosis not present

## 2018-08-08 LAB — BASIC METABOLIC PANEL
Anion gap: 11 (ref 5–15)
BUN: 14 mg/dL (ref 8–23)
CHLORIDE: 106 mmol/L (ref 98–111)
CO2: 25 mmol/L (ref 22–32)
CREATININE: 0.37 mg/dL — AB (ref 0.44–1.00)
Calcium: 9.3 mg/dL (ref 8.9–10.3)
GFR calc Af Amer: >60 mL/min (ref >60)
GFR calc non Af Amer: >60 mL/min (ref >60)
GLUCOSE: 130 mg/dL — AB (ref 70–99)
POTASSIUM: 3.5 mmol/L (ref 3.5–5.1)
SODIUM: 142 mmol/L (ref 135–145)

## 2018-08-08 LAB — CK: Total CK: 126 U/L (ref 38–234)

## 2018-08-08 LAB — CBC
HEMATOCRIT: 39.2 % (ref 36.0–46.0)
HEMOGLOBIN: 13.3 g/dL (ref 12.0–15.0)
MCH: 28.5 pg (ref 26.0–34.0)
MCHC: 33.9 g/dL (ref 30.0–36.0)
MCV: 83.9 fL (ref 78.0–100.0)
Platelets: 190 10*3/uL (ref 150–400)
RBC: 4.67 MIL/uL (ref 3.87–5.11)
RDW: 13.1 % (ref 11.5–15.5)
WBC: 6 10*3/uL (ref 4.0–10.5)

## 2018-08-08 MED ORDER — IBUPROFEN 200 MG PO TABS
400.0000 mg | ORAL_TABLET | Freq: Once | ORAL | Status: AC
  Administered 2018-08-08: 400 mg via ORAL
  Filled 2018-08-08: qty 2

## 2018-08-08 MED ORDER — ACETAMINOPHEN 325 MG PO TABS
650.0000 mg | ORAL_TABLET | Freq: Once | ORAL | Status: AC
  Administered 2018-08-08: 650 mg via ORAL
  Filled 2018-08-08: qty 2

## 2018-08-08 NOTE — Discharge Instructions (Signed)
No head bleed or spine fractures. You have an old tail bone fracture. This does not require treatment. You can get up and walk as you normally do.  Please take tylenol every 6hrs at home as needed for pain.  Return to the ER if you have any new or concerning symptoms

## 2018-08-08 NOTE — ED Notes (Signed)
Bed: ST41 Expected date:  Expected time:  Means of arrival:  Comments: 66F from SNF Fall

## 2018-08-08 NOTE — ED Provider Notes (Signed)
[facility] DEPT Provider Note   CSN: [id number] Arrival date & time: 08/08/18  0549     History   Chief Complaint Chief Complaint  Patient presents with  . Fall    HPI Kendra Hernandez is a 77 y.o. female.  HPI  Kendra Hernandez is a 77yo female with a history of CVA, hypertension, hyperlipidemia, COPD, dementia who presents to the emergency department from her assisted living facility for evaluation after unwitnessed fall overnight.  Patient reports that around 12 AM she was sitting up to go to the bathroom.  She reached for her walker, but it slid away from her and she fell forward.  She does not think that she hit her head or loss consciousness.  She was unable to get up and was on the ground for 5 hours before 1 of the nursing staff heard her screaming.  She reports that she now has midline neck and back pain.  Pain is worsened with sitting up or rotating the neck.  Alleviated by laying flat.  She has not taken any over-the-counter medications for her symptoms. Per chart review she is not on any blood thinners.  She also is reporting anterior chest wall pain.  She denies headache, nausea/vomiting, numbness, weakness, visual disturbance, shortness of breath, abdominal pain, open wounds or arthralgias elsewhere. Has not been able to ambulate since the fall. Normally uses a walker.   Past Medical History:  Diagnosis Date  . Anxiety   . Arthritis   . COPD (chronic obstructive pulmonary disease) ([place])   . Hyperlipidemia   . Hypertension   . Hypothyroid   . Insomnia   . Reflux   . Repeated falls   . Skin cancer   . Stress   . Stroke (cerebrum) [facility])     Patient Active Problem List   Diagnosis Date Noted  . Dementia with behavioral disturbance 11/11/2017  . Chronic ischemic right MCA stroke 11/11/2017  . Alcoholism, chronic ([place]) 06/27/2017  . Isopropyl alcohol poisoning   . Major depressive disorder, recurrent episode ([place]) 12/21/2016  . Major  depressive disorder, recurrent episode, severe ([place]) 11/24/2016  . Alcohol withdrawal ([place]) 07/17/2016  . Withdrawal symptoms, alcohol ([place]) 07/17/2016  . Hypothyroidism 07/17/2016  . History of CVA (cerebrovascular accident) 07/17/2016  . Head contusion 07/17/2016  . Fall 07/17/2016  . Insomnia 07/17/2016  . Essential hypertension 07/17/2016  . Abnormality of gait 01/24/2016  . Memory loss 01/24/2016  . Acute right MCA stroke ([place]) 01/09/2016  . Essential hypertension, benign 01/09/2016  . Hyperlipidemia LDL goal <100 01/09/2016  . Other specified hypothyroidism 01/09/2016  . Cervicalgia 01/09/2016  . Esophageal reflux 01/09/2016  . Major depression, chronic 01/09/2016    Past Surgical History:  Procedure Laterality Date  . COLONOSCOPY     With polyp removal   . MANDIBLE FRACTURE SURGERY     Placement of partial jaw due to abcess  . SKIN CANCER EXCISION     Removal of several skin cancers  . TOE SURGERY Right    Removal of toe      OB History   None      Home Medications    Prior to Admission medications   Medication Sig Start Date End Date Taking? Authorizing Provider  acetaminophen (TYLENOL) 500 MG tablet Take 1,000 mg by mouth every 8 (eight) hours as needed for mild pain.    [provider]  amLODipine (NORVASC) 10 MG tablet Take 10 mg by mouth daily.    [provider]  atorvastatin (LIPITOR) 20 MG tablet Take 20 mg by mouth daily.    [provider]  bisacodyl (DULCOLAX) 5 MG EC tablet Take 15 mg by mouth as directed. 3 tablets one time daily every Wednesday    [provider]  donepezil (ARICEPT) 10 MG tablet Take 1 tablet (10 mg total) daily by mouth. After meal 11/11/17   Marcial Pacas, MD  doxycycline (VIBRAMYCIN) 100 MG capsule Take 100 mg 2 (two) times daily by mouth.    [provider]  hydrALAZINE (APRESOLINE) 50 MG tablet Take 50 mg by mouth 2 (two) times daily.    [provider]  hydrOXYzine  (ATARAX/VISTARIL) 50 MG tablet Take 1 tablet by mouth every 12 (twelve) hours as needed for anxiety.  06/24/17   [provider]  labetalol (NORMODYNE) 200 MG tablet Take 200 mg by mouth 2 (two) times daily.     [provider]  levothyroxine (SYNTHROID, LEVOTHROID) 25 MCG tablet Take 25 mcg by mouth daily before breakfast.    [provider]  loratadine (CLARITIN) 10 MG tablet Take 10 mg by mouth daily.    [provider]  omeprazole (PRILOSEC) 40 MG capsule Take 40 mg by mouth daily.    [provider]  QUEtiapine (SEROQUEL) 25 MG tablet Take 25 mg by mouth daily.     [provider]  traZODone (DESYREL) 100 MG tablet Take 200 mg by mouth at bedtime.     [provider]  venlafaxine XR (EFFEXOR-XR) 75 MG 24 hr capsule Take 225 mg by mouth daily with breakfast.    [provider]    Family History Family History  Problem Relation Age of Onset  . Emphysema Mother   . Rheumatologic disease Mother   . Heart attack Father   . Emphysema Brother   . Pancreatic cancer Daughter     Social History Social History   Tobacco Use  . Smoking status: Current Every Day Smoker    Packs/day: 2.00    Years: 30.00    Pack years: 60.00    Types: Cigarettes  . Smokeless tobacco: Never Used  Substance Use Topics  . Alcohol use: Yes    Alcohol/week: 0.0 standard drinks    Comment: last drink 2 wks ago 11-05-16, but is uncontrollable.  . Drug use: No     Allergies   Ceftin [cefuroxime axetil]; Penicillins; and Simvastatin   Review of Systems Review of Systems  Constitutional: Negative for chills and fever.  HENT: Negative for facial swelling.   Eyes: Negative for visual disturbance.  Respiratory: Negative for shortness of breath.   Cardiovascular: Positive for chest pain.  Gastrointestinal: Negative for abdominal pain, nausea and vomiting.  Genitourinary: Negative for difficulty urinating.  Musculoskeletal: Positive for  back pain and neck pain.  Skin: Negative for color change and wound.  Neurological: Negative for weakness, numbness and headaches.     Physical Exam Updated Vital Signs BP (!) 158/81 (BP Location: Left Arm)   Pulse 76   Temp 98 F (36.7 C) (Oral)   Resp 18   Ht 5\' 4"  (1.626 m)   Wt 54.4 kg   SpO2 98%   BMI 20.60 kg/m   Physical Exam  Constitutional: She appears well-developed and well-nourished. No distress.  Non-toxic appearing.   HENT:  Head: Normocephalic and atraumatic.  Mouth/Throat: Oropharynx is clear and moist.  No evidence of head trauma. No facial swelling or laceration. No battle sign or racoon eyes.  Eyes: Pupils are equal, round, and reactive to light. Conjunctivae are normal. Right eye exhibits no discharge. Left eye exhibits no discharge.  Neck:  Tender to palpation over several spinous processes of the cervical spine. No step off or deformity appreciated.   Cardiovascular: Normal rate and regular rhythm.  Pulmonary/Chest: Effort normal and breath sounds normal. No stridor. No respiratory distress. She has no wheezes. She has no rales.  Abdominal: Soft. There is no tenderness.  Musculoskeletal:  Tender to palpation diffusely along midline thoracic and lumbar spine as well as paraspinal muscles.  No hip tenderness.  No tenderness over joints of the upper or lower extremities.  Neurological: She is alert. Coordination normal.  Mental Status:  Alert, oriented, thought content appropriate, able to give a coherent history. Speech fluent without evidence of aphasia. Able to follow 2 step commands without difficulty.  Motor:  Normal tone. 5/5 in upper and lower extremities bilaterally including strong and equal grip strength and dorsiflexion/plantar flexion Sensory: Pinprick and light touch normal in all extremities.    Skin: Skin is warm and dry. She is not diaphoretic.  Psychiatric: She has a normal mood and affect. Her behavior is normal.  Nursing note and  vitals reviewed.    ED Treatments / Results  Labs (all labs ordered are listed, but only abnormal results are displayed) Labs Reviewed  BASIC METABOLIC PANEL - Abnormal; Notable for the following components:      Result Value   Glucose, Bld 130 (*)    Creatinine, Ser 0.37 (*)    All other components within normal limits  CK  CBC    EKG None  Radiology Ct Head Wo Contrast  Result Date: 08/08/2018 CLINICAL DATA:  Unwitnessed fall. EXAM: CT HEAD WITHOUT CONTRAST CT CERVICAL SPINE WITHOUT CONTRAST TECHNIQUE: Multidetector CT imaging of the head and cervical spine was performed following the standard protocol without intravenous contrast. Multiplanar CT image reconstructions of the cervical spine were also generated. COMPARISON:  None. FINDINGS: CT HEAD FINDINGS Brain: No subdural, epidural, or subarachnoid hemorrhage. Cerebellum, brainstem, and basal cisterns are normal. Ventricles and sulci are stable. White matter changes and acute or infarcts are stable. A right temporal lobe infarct with encephalomalacia is stable. No acute cortical ischemia or infarct. No mass effect or midline shift. Vascular: Calcified atherosclerosis in the intracranial carotids. Skull: Normal. Negative for fracture or focal lesion. Sinuses/Orbits: No acute finding. Other: None. CT CERVICAL SPINE FINDINGS Alignment: Anterolisthesis of C4 versus C5 is stable since March 11, 2018. Minimal retrolisthesis of C5 versus C6 is stable as well. No other malalignment. Skull base and vertebrae: Anterior wedging of T1 is stable. No acute fractures noted. Soft tissues and spinal canal: Calcified atherosclerosis in the cervical carotid arteries. Disc levels:  Multilevel degenerative changes. Upper chest: Negative. Other: No other abnormalities. IMPRESSION: 1. No acute intracranial abnormalities. Chronic right temporal lobe infarct with encephalomalacia. Chronic white matter changes. 2. Severe degenerative changes in the thoracic spine.  No acute traumatic malalignment or fracture noted. Electronically Signed   By: Dorise Bullion III M.D   On: 08/08/2018 08:14   Ct Cervical Spine Wo Contrast  Result Date: 08/08/2018 CLINICAL DATA:  Unwitnessed fall. EXAM: CT HEAD WITHOUT CONTRAST CT CERVICAL SPINE WITHOUT CONTRAST TECHNIQUE: Multidetector CT imaging of the head and cervical spine was performed following the standard protocol without intravenous contrast. Multiplanar CT image reconstructions of the cervical spine were also generated. COMPARISON:  None. FINDINGS: CT HEAD FINDINGS Brain: No subdural, epidural, or subarachnoid hemorrhage. Cerebellum,  brainstem, and basal cisterns are normal. Ventricles and sulci are stable. White matter changes and acute or infarcts are stable. A right temporal lobe infarct with encephalomalacia is stable. No acute cortical ischemia or infarct. No mass effect or midline shift. Vascular: Calcified atherosclerosis in the intracranial carotids. Skull: Normal. Negative for fracture or focal lesion. Sinuses/Orbits: No acute finding. Other: None. CT CERVICAL SPINE FINDINGS Alignment: Anterolisthesis of C4 versus C5 is stable since March 11, 2018. Minimal retrolisthesis of C5 versus C6 is stable as well. No other malalignment. Skull base and vertebrae: Anterior wedging of T1 is stable. No acute fractures noted. Soft tissues and spinal canal: Calcified atherosclerosis in the cervical carotid arteries. Disc levels:  Multilevel degenerative changes. Upper chest: Negative. Other: No other abnormalities. IMPRESSION: 1. No acute intracranial abnormalities. Chronic right temporal lobe infarct with encephalomalacia. Chronic white matter changes. 2. Severe degenerative changes in the thoracic spine. No acute traumatic malalignment or fracture noted. Electronically Signed   By: Dorise Bullion III M.D   On: 08/08/2018 08:14   Ct Thoracic Spine Wo Contrast  Result Date: 08/08/2018 CLINICAL DATA:  Unwitnessed fall lower back  pain. EXAM: CT THORACIC AND LUMBAR SPINE WITHOUT CONTRAST TECHNIQUE: Multidetector CT imaging of the thoracic and lumbar spine was performed without contrast. Multiplanar CT image reconstructions were also generated. COMPARISON:  None. FINDINGS: CT THORACIC SPINE FINDINGS Alignment: Exaggerated thoracic kyphosis. Mild facet mediated C7-T1 anterolisthesis. There is also partially covered C4-5 degenerative anterolisthesis. Vertebrae: Negative for acute fracture. T1, T2, and T3 superior endplate fractures have a chronic appearance. Chronic Schmorl's nodes in the superior endplates of T1, T3, and T4. Paraspinal and other soft tissues: Moderate hiatal hernia. Minimal centrilobular emphysema. Extensive atherosclerotic calcification. Disc levels: Limited degenerative changes. No evident impingement. CT LUMBAR SPINE FINDINGS Segmentation: 5 lumbar type vertebral bodies Alignment: Grade 1 borderline grade 2 degenerative anterolisthesis at L4-5. Mild retrolisthesis at L1-2. Vertebrae: Cortical buckling and subtle lucency in the left sacral ala compatible with insufficiency fracture. Patchy superimposed sclerosis favors nonacute injury. Suspect there was a transverse component at the S2-3 level where there is also patchy sclerosis. No fracture seen in the right ala. Paraspinal and other soft tissues: No evident acute injury. Extensive atherosclerosis. Colonic diverticulosis. Disc levels: Advanced facet arthropathy at L4-5 with anterolisthesis. Milder facet spurring elsewhere. Disc narrowing at L1-2, L4-5, and to the greatest degree at L5-S1. Foraminal narrowing with probable impingement on the left at L4-5. Diffusely patent spinal canal with mild stenosis at L4-5. IMPRESSION: CT THORACIC SPINE IMPRESSION No acute finding. CT LUMBAR SPINE IMPRESSION 1. Nondisplaced left sacral ala fracture with sclerosis favoring nonacute injury. Fracture likely continued transversely across the mid sacrum. 2. L4-5 advanced facet arthropathy  with anterolisthesis. The canal and foramina are narrowed at this level. Electronically Signed   By: Monte Fantasia M.D.   On: 08/08/2018 07:59   Ct Lumbar Spine Wo Contrast  Result Date: 08/08/2018 CLINICAL DATA:  Unwitnessed fall lower back pain. EXAM: CT THORACIC AND LUMBAR SPINE WITHOUT CONTRAST TECHNIQUE: Multidetector CT imaging of the thoracic and lumbar spine was performed without contrast. Multiplanar CT image reconstructions were also generated. COMPARISON:  None. FINDINGS: CT THORACIC SPINE FINDINGS Alignment: Exaggerated thoracic kyphosis. Mild facet mediated C7-T1 anterolisthesis. There is also partially covered C4-5 degenerative anterolisthesis. Vertebrae: Negative for acute fracture. T1, T2, and T3 superior endplate fractures have a chronic appearance. Chronic Schmorl's nodes in the superior endplates of T1, T3, and T4. Paraspinal and other soft tissues: Moderate hiatal hernia. Minimal centrilobular emphysema.  Extensive atherosclerotic calcification. Disc levels: Limited degenerative changes. No evident impingement. CT LUMBAR SPINE FINDINGS Segmentation: 5 lumbar type vertebral bodies Alignment: Grade 1 borderline grade 2 degenerative anterolisthesis at L4-5. Mild retrolisthesis at L1-2. Vertebrae: Cortical buckling and subtle lucency in the left sacral ala compatible with insufficiency fracture. Patchy superimposed sclerosis favors nonacute injury. Suspect there was a transverse component at the S2-3 level where there is also patchy sclerosis. No fracture seen in the right ala. Paraspinal and other soft tissues: No evident acute injury. Extensive atherosclerosis. Colonic diverticulosis. Disc levels: Advanced facet arthropathy at L4-5 with anterolisthesis. Milder facet spurring elsewhere. Disc narrowing at L1-2, L4-5, and to the greatest degree at L5-S1. Foraminal narrowing with probable impingement on the left at L4-5. Diffusely patent spinal canal with mild stenosis at L4-5. IMPRESSION: CT  THORACIC SPINE IMPRESSION No acute finding. CT LUMBAR SPINE IMPRESSION 1. Nondisplaced left sacral ala fracture with sclerosis favoring nonacute injury. Fracture likely continued transversely across the mid sacrum. 2. L4-5 advanced facet arthropathy with anterolisthesis. The canal and foramina are narrowed at this level. Electronically Signed   By: Monte Fantasia M.D.   On: 08/08/2018 07:59    Procedures Procedures (including critical care time)  Medications Ordered in ED Medications  acetaminophen (TYLENOL) tablet 650 mg (650 mg Oral Given 08/08/18 0920)  ibuprofen (ADVIL,MOTRIN) tablet 400 mg (400 mg Oral Given 08/08/18 0920)   Initial Impression / Assessment and Plan / ED Course  I have reviewed the triage vital signs and the nursing notes.  Pertinent labs & imaging results that were available during my care of the patient were reviewed by me and considered in my medical decision making (see chart for details).  Patient presents after an unwitnessed fall at her nursing facility early this morning.  She was reportedly on the ground for 5 hours before anyone found her.  On exam she is alert and oriented x3.  She has diffuse midline spinal tenderness.  No neurological deficits.  CT head without acute intracranial abnormality.  CT cervical, thoracic and lumbar spine without acute fracture.  CT lumbar spine does show nondisplaced left sacral ala fracture which is nonacute and appears old.  On recheck, patient reports pain improved with Tylenol.  I ambulated her in the hallway she is able to do this without difficulty.  Lab work including CBC, BMP and CK unremarkable. No concern for rhabdo. Patient discharged to nursing facility with instructions to use Tylenol as needed for pain.  She agrees and states she is ready for discharge.  This was a shared visit with Dr. Leonette Monarch who also saw the patient and agrees with plan to discharge home.    Final Clinical Impressions(s) / ED Diagnoses   Final  diagnoses:  Fall, initial encounter    ED Discharge Orders    None       Bernarda Caffey 08/08/18 1617    Cardama, Grayce Sessions, MD 08/08/18 1730

## 2018-08-08 NOTE — ED Notes (Signed)
NO RX GIVEN 

## 2018-08-08 NOTE — ED Triage Notes (Signed)
Per EMS pt coming from [facility] facility with a c/o unwitnessed fall. Per EMS pt reports getting up to go to bathroom around midnight, and while reaching for her walker she lost her balance and fell. Per EMS pt sts she was laying on the floor, unable to get up for over five hours. Pt c/o lower back pain and exacerbated fibromyalgia pain due to the fall. EMS VS: 140/90 78 hr, 18 rr.

## 2018-08-08 NOTE — ED Notes (Signed)
BLANKET GIVEN TO SON AT DISCHARGE

## 2018-08-08 NOTE — ED Notes (Signed)
AFTER WALK PT ASSISTED TO CARDIAC CHAIR FOR COMFORT. DIET TRAY ORDERED. PT WITHOUT COMPLAINT. AWARE OF DISCHARGE AND PLAN OF CARE

## 2018-09-01 DIAGNOSIS — R32 Unspecified urinary incontinence: Secondary | ICD-10-CM | POA: Diagnosis not present

## 2018-09-01 DIAGNOSIS — M545 Low back pain: Secondary | ICD-10-CM | POA: Diagnosis not present

## 2018-09-01 DIAGNOSIS — R269 Unspecified abnormalities of gait and mobility: Secondary | ICD-10-CM | POA: Diagnosis not present

## 2018-09-01 DIAGNOSIS — I10 Essential (primary) hypertension: Secondary | ICD-10-CM | POA: Diagnosis not present

## 2018-09-11 DIAGNOSIS — E038 Other specified hypothyroidism: Secondary | ICD-10-CM | POA: Diagnosis not present

## 2018-09-11 DIAGNOSIS — F329 Major depressive disorder, single episode, unspecified: Secondary | ICD-10-CM | POA: Diagnosis not present

## 2018-09-11 DIAGNOSIS — I69393 Ataxia following cerebral infarction: Secondary | ICD-10-CM | POA: Diagnosis not present

## 2018-09-11 DIAGNOSIS — Z9181 History of falling: Secondary | ICD-10-CM | POA: Diagnosis not present

## 2018-09-11 DIAGNOSIS — I10 Essential (primary) hypertension: Secondary | ICD-10-CM | POA: Diagnosis not present

## 2018-09-11 DIAGNOSIS — K219 Gastro-esophageal reflux disease without esophagitis: Secondary | ICD-10-CM | POA: Diagnosis not present

## 2018-09-11 DIAGNOSIS — M545 Low back pain: Secondary | ICD-10-CM | POA: Diagnosis not present

## 2018-09-11 DIAGNOSIS — F0281 Dementia in other diseases classified elsewhere with behavioral disturbance: Secondary | ICD-10-CM | POA: Diagnosis not present

## 2018-09-15 DIAGNOSIS — I69393 Ataxia following cerebral infarction: Secondary | ICD-10-CM | POA: Diagnosis not present

## 2018-09-15 DIAGNOSIS — M545 Low back pain: Secondary | ICD-10-CM | POA: Diagnosis not present

## 2018-09-15 DIAGNOSIS — E038 Other specified hypothyroidism: Secondary | ICD-10-CM | POA: Diagnosis not present

## 2018-09-15 DIAGNOSIS — K219 Gastro-esophageal reflux disease without esophagitis: Secondary | ICD-10-CM | POA: Diagnosis not present

## 2018-09-15 DIAGNOSIS — F329 Major depressive disorder, single episode, unspecified: Secondary | ICD-10-CM | POA: Diagnosis not present

## 2018-09-15 DIAGNOSIS — I10 Essential (primary) hypertension: Secondary | ICD-10-CM | POA: Diagnosis not present

## 2018-09-17 DIAGNOSIS — I10 Essential (primary) hypertension: Secondary | ICD-10-CM | POA: Diagnosis not present

## 2018-09-17 DIAGNOSIS — F329 Major depressive disorder, single episode, unspecified: Secondary | ICD-10-CM | POA: Diagnosis not present

## 2018-09-17 DIAGNOSIS — E038 Other specified hypothyroidism: Secondary | ICD-10-CM | POA: Diagnosis not present

## 2018-09-17 DIAGNOSIS — M545 Low back pain: Secondary | ICD-10-CM | POA: Diagnosis not present

## 2018-09-17 DIAGNOSIS — K219 Gastro-esophageal reflux disease without esophagitis: Secondary | ICD-10-CM | POA: Diagnosis not present

## 2018-09-17 DIAGNOSIS — I69393 Ataxia following cerebral infarction: Secondary | ICD-10-CM | POA: Diagnosis not present

## 2018-09-21 DIAGNOSIS — K219 Gastro-esophageal reflux disease without esophagitis: Secondary | ICD-10-CM | POA: Diagnosis not present

## 2018-09-21 DIAGNOSIS — I10 Essential (primary) hypertension: Secondary | ICD-10-CM | POA: Diagnosis not present

## 2018-09-21 DIAGNOSIS — F329 Major depressive disorder, single episode, unspecified: Secondary | ICD-10-CM | POA: Diagnosis not present

## 2018-09-21 DIAGNOSIS — I69393 Ataxia following cerebral infarction: Secondary | ICD-10-CM | POA: Diagnosis not present

## 2018-09-21 DIAGNOSIS — M545 Low back pain: Secondary | ICD-10-CM | POA: Diagnosis not present

## 2018-09-21 DIAGNOSIS — E038 Other specified hypothyroidism: Secondary | ICD-10-CM | POA: Diagnosis not present

## 2018-09-25 DIAGNOSIS — E038 Other specified hypothyroidism: Secondary | ICD-10-CM | POA: Diagnosis not present

## 2018-09-25 DIAGNOSIS — F329 Major depressive disorder, single episode, unspecified: Secondary | ICD-10-CM | POA: Diagnosis not present

## 2018-09-25 DIAGNOSIS — M545 Low back pain: Secondary | ICD-10-CM | POA: Diagnosis not present

## 2018-09-25 DIAGNOSIS — I10 Essential (primary) hypertension: Secondary | ICD-10-CM | POA: Diagnosis not present

## 2018-09-25 DIAGNOSIS — K219 Gastro-esophageal reflux disease without esophagitis: Secondary | ICD-10-CM | POA: Diagnosis not present

## 2018-09-25 DIAGNOSIS — I69393 Ataxia following cerebral infarction: Secondary | ICD-10-CM | POA: Diagnosis not present

## 2018-09-30 DIAGNOSIS — E038 Other specified hypothyroidism: Secondary | ICD-10-CM | POA: Diagnosis not present

## 2018-09-30 DIAGNOSIS — F329 Major depressive disorder, single episode, unspecified: Secondary | ICD-10-CM | POA: Diagnosis not present

## 2018-09-30 DIAGNOSIS — M545 Low back pain: Secondary | ICD-10-CM | POA: Diagnosis not present

## 2018-09-30 DIAGNOSIS — K219 Gastro-esophageal reflux disease without esophagitis: Secondary | ICD-10-CM | POA: Diagnosis not present

## 2018-09-30 DIAGNOSIS — I69393 Ataxia following cerebral infarction: Secondary | ICD-10-CM | POA: Diagnosis not present

## 2018-09-30 DIAGNOSIS — I10 Essential (primary) hypertension: Secondary | ICD-10-CM | POA: Diagnosis not present

## 2018-10-02 DIAGNOSIS — M545 Low back pain: Secondary | ICD-10-CM | POA: Diagnosis not present

## 2018-10-02 DIAGNOSIS — I69393 Ataxia following cerebral infarction: Secondary | ICD-10-CM | POA: Diagnosis not present

## 2018-10-02 DIAGNOSIS — I10 Essential (primary) hypertension: Secondary | ICD-10-CM | POA: Diagnosis not present

## 2018-10-02 DIAGNOSIS — E038 Other specified hypothyroidism: Secondary | ICD-10-CM | POA: Diagnosis not present

## 2018-10-02 DIAGNOSIS — K219 Gastro-esophageal reflux disease without esophagitis: Secondary | ICD-10-CM | POA: Diagnosis not present

## 2018-10-02 DIAGNOSIS — F329 Major depressive disorder, single episode, unspecified: Secondary | ICD-10-CM | POA: Diagnosis not present

## 2018-10-07 DIAGNOSIS — Z23 Encounter for immunization: Secondary | ICD-10-CM | POA: Diagnosis not present

## 2018-10-09 DIAGNOSIS — K219 Gastro-esophageal reflux disease without esophagitis: Secondary | ICD-10-CM | POA: Diagnosis not present

## 2018-10-09 DIAGNOSIS — I10 Essential (primary) hypertension: Secondary | ICD-10-CM | POA: Diagnosis not present

## 2018-10-09 DIAGNOSIS — F329 Major depressive disorder, single episode, unspecified: Secondary | ICD-10-CM | POA: Diagnosis not present

## 2018-10-09 DIAGNOSIS — E038 Other specified hypothyroidism: Secondary | ICD-10-CM | POA: Diagnosis not present

## 2018-10-09 DIAGNOSIS — I69393 Ataxia following cerebral infarction: Secondary | ICD-10-CM | POA: Diagnosis not present

## 2018-10-09 DIAGNOSIS — M545 Low back pain: Secondary | ICD-10-CM | POA: Diagnosis not present

## 2018-10-14 DIAGNOSIS — M545 Low back pain: Secondary | ICD-10-CM | POA: Diagnosis not present

## 2018-10-14 DIAGNOSIS — F329 Major depressive disorder, single episode, unspecified: Secondary | ICD-10-CM | POA: Diagnosis not present

## 2018-10-14 DIAGNOSIS — K219 Gastro-esophageal reflux disease without esophagitis: Secondary | ICD-10-CM | POA: Diagnosis not present

## 2018-10-14 DIAGNOSIS — E038 Other specified hypothyroidism: Secondary | ICD-10-CM | POA: Diagnosis not present

## 2018-10-14 DIAGNOSIS — I69393 Ataxia following cerebral infarction: Secondary | ICD-10-CM | POA: Diagnosis not present

## 2018-10-14 DIAGNOSIS — I10 Essential (primary) hypertension: Secondary | ICD-10-CM | POA: Diagnosis not present

## 2018-10-15 DIAGNOSIS — I69393 Ataxia following cerebral infarction: Secondary | ICD-10-CM | POA: Diagnosis not present

## 2018-10-15 DIAGNOSIS — I10 Essential (primary) hypertension: Secondary | ICD-10-CM | POA: Diagnosis not present

## 2018-10-15 DIAGNOSIS — E038 Other specified hypothyroidism: Secondary | ICD-10-CM | POA: Diagnosis not present

## 2018-10-15 DIAGNOSIS — K219 Gastro-esophageal reflux disease without esophagitis: Secondary | ICD-10-CM | POA: Diagnosis not present

## 2018-10-15 DIAGNOSIS — M545 Low back pain: Secondary | ICD-10-CM | POA: Diagnosis not present

## 2018-10-15 DIAGNOSIS — F329 Major depressive disorder, single episode, unspecified: Secondary | ICD-10-CM | POA: Diagnosis not present

## 2018-10-21 DIAGNOSIS — F329 Major depressive disorder, single episode, unspecified: Secondary | ICD-10-CM | POA: Diagnosis not present

## 2018-10-21 DIAGNOSIS — I10 Essential (primary) hypertension: Secondary | ICD-10-CM | POA: Diagnosis not present

## 2018-10-21 DIAGNOSIS — E038 Other specified hypothyroidism: Secondary | ICD-10-CM | POA: Diagnosis not present

## 2018-10-21 DIAGNOSIS — K219 Gastro-esophageal reflux disease without esophagitis: Secondary | ICD-10-CM | POA: Diagnosis not present

## 2018-10-21 DIAGNOSIS — M545 Low back pain: Secondary | ICD-10-CM | POA: Diagnosis not present

## 2018-10-21 DIAGNOSIS — I69393 Ataxia following cerebral infarction: Secondary | ICD-10-CM | POA: Diagnosis not present

## 2018-10-23 DIAGNOSIS — F329 Major depressive disorder, single episode, unspecified: Secondary | ICD-10-CM | POA: Diagnosis not present

## 2018-10-23 DIAGNOSIS — K219 Gastro-esophageal reflux disease without esophagitis: Secondary | ICD-10-CM | POA: Diagnosis not present

## 2018-10-23 DIAGNOSIS — I10 Essential (primary) hypertension: Secondary | ICD-10-CM | POA: Diagnosis not present

## 2018-10-23 DIAGNOSIS — I69393 Ataxia following cerebral infarction: Secondary | ICD-10-CM | POA: Diagnosis not present

## 2018-10-23 DIAGNOSIS — E038 Other specified hypothyroidism: Secondary | ICD-10-CM | POA: Diagnosis not present

## 2018-10-23 DIAGNOSIS — M545 Low back pain: Secondary | ICD-10-CM | POA: Diagnosis not present

## 2018-11-05 DIAGNOSIS — R351 Nocturia: Secondary | ICD-10-CM | POA: Diagnosis not present

## 2018-11-05 DIAGNOSIS — N3941 Urge incontinence: Secondary | ICD-10-CM | POA: Diagnosis not present

## 2018-12-31 DIAGNOSIS — L603 Nail dystrophy: Secondary | ICD-10-CM | POA: Diagnosis not present

## 2018-12-31 DIAGNOSIS — I639 Cerebral infarction, unspecified: Secondary | ICD-10-CM | POA: Diagnosis not present

## 2018-12-31 DIAGNOSIS — L851 Acquired keratosis [keratoderma] palmaris et plantaris: Secondary | ICD-10-CM | POA: Diagnosis not present

## 2018-12-31 DIAGNOSIS — R2681 Unsteadiness on feet: Secondary | ICD-10-CM | POA: Diagnosis not present

## 2018-12-31 DIAGNOSIS — M79673 Pain in unspecified foot: Secondary | ICD-10-CM | POA: Diagnosis not present

## 2019-03-11 DIAGNOSIS — L851 Acquired keratosis [keratoderma] palmaris et plantaris: Secondary | ICD-10-CM | POA: Diagnosis not present

## 2019-03-11 DIAGNOSIS — L603 Nail dystrophy: Secondary | ICD-10-CM | POA: Diagnosis not present

## 2019-03-11 DIAGNOSIS — M79673 Pain in unspecified foot: Secondary | ICD-10-CM | POA: Diagnosis not present

## 2019-03-11 DIAGNOSIS — R269 Unspecified abnormalities of gait and mobility: Secondary | ICD-10-CM | POA: Diagnosis not present

## 2019-03-11 DIAGNOSIS — I639 Cerebral infarction, unspecified: Secondary | ICD-10-CM | POA: Diagnosis not present

## 2019-05-21 DIAGNOSIS — Z0000 Encounter for general adult medical examination without abnormal findings: Secondary | ICD-10-CM | POA: Diagnosis not present

## 2019-05-21 DIAGNOSIS — Z1389 Encounter for screening for other disorder: Secondary | ICD-10-CM | POA: Diagnosis not present

## 2019-06-10 DIAGNOSIS — Z20828 Contact with and (suspected) exposure to other viral communicable diseases: Secondary | ICD-10-CM | POA: Diagnosis not present

## 2019-11-19 DIAGNOSIS — N3941 Urge incontinence: Secondary | ICD-10-CM | POA: Diagnosis not present

## 2019-11-19 DIAGNOSIS — R102 Pelvic and perineal pain: Secondary | ICD-10-CM | POA: Diagnosis not present

## 2019-11-19 DIAGNOSIS — R3915 Urgency of urination: Secondary | ICD-10-CM | POA: Diagnosis not present

## 2019-11-19 DIAGNOSIS — R309 Painful micturition, unspecified: Secondary | ICD-10-CM | POA: Diagnosis not present

## 2019-11-19 DIAGNOSIS — R351 Nocturia: Secondary | ICD-10-CM | POA: Diagnosis not present

## 2020-01-12 DIAGNOSIS — Z23 Encounter for immunization: Secondary | ICD-10-CM | POA: Diagnosis not present

## 2020-01-18 ENCOUNTER — Other Ambulatory Visit: Payer: Self-pay

## 2020-01-18 ENCOUNTER — Emergency Department (HOSPITAL_COMMUNITY): Payer: Medicare Other

## 2020-01-18 ENCOUNTER — Encounter (HOSPITAL_COMMUNITY): Payer: Self-pay | Admitting: Emergency Medicine

## 2020-01-18 ENCOUNTER — Inpatient Hospital Stay (HOSPITAL_COMMUNITY)
Admission: EM | Admit: 2020-01-18 | Discharge: 2020-01-24 | DRG: 536 | Disposition: A | Discharge status: Skilled Nursing Facility | Payer: Medicare Other | Attending: Internal Medicine | Admitting: Internal Medicine

## 2020-01-18 DIAGNOSIS — S42114A Nondisplaced fracture of body of scapula, right shoulder, initial encounter for closed fracture: Secondary | ICD-10-CM | POA: Diagnosis not present

## 2020-01-18 DIAGNOSIS — S32591A Other specified fracture of right pubis, initial encounter for closed fracture: Principal | ICD-10-CM

## 2020-01-18 DIAGNOSIS — K219 Gastro-esophageal reflux disease without esophagitis: Secondary | ICD-10-CM | POA: Diagnosis not present

## 2020-01-18 DIAGNOSIS — Z20822 Contact with and (suspected) exposure to covid-19: Secondary | ICD-10-CM | POA: Diagnosis present

## 2020-01-18 DIAGNOSIS — S32599A Other specified fracture of unspecified pubis, initial encounter for closed fracture: Secondary | ICD-10-CM | POA: Diagnosis present

## 2020-01-18 DIAGNOSIS — M25511 Pain in right shoulder: Secondary | ICD-10-CM | POA: Diagnosis not present

## 2020-01-18 DIAGNOSIS — W19XXXA Unspecified fall, initial encounter: Secondary | ICD-10-CM | POA: Diagnosis not present

## 2020-01-18 DIAGNOSIS — Z825 Family history of asthma and other chronic lower respiratory diseases: Secondary | ICD-10-CM

## 2020-01-18 DIAGNOSIS — R0602 Shortness of breath: Secondary | ICD-10-CM

## 2020-01-18 DIAGNOSIS — J9811 Atelectasis: Secondary | ICD-10-CM | POA: Diagnosis not present

## 2020-01-18 DIAGNOSIS — S42101A Fracture of unspecified part of scapula, right shoulder, initial encounter for closed fracture: Secondary | ICD-10-CM | POA: Diagnosis not present

## 2020-01-18 DIAGNOSIS — R0689 Other abnormalities of breathing: Secondary | ICD-10-CM | POA: Diagnosis not present

## 2020-01-18 DIAGNOSIS — S32810A Multiple fractures of pelvis with stable disruption of pelvic ring, initial encounter for closed fracture: Secondary | ICD-10-CM | POA: Diagnosis not present

## 2020-01-18 DIAGNOSIS — S42111A Displaced fracture of body of scapula, right shoulder, initial encounter for closed fracture: Secondary | ICD-10-CM | POA: Diagnosis not present

## 2020-01-18 DIAGNOSIS — S199XXA Unspecified injury of neck, initial encounter: Secondary | ICD-10-CM | POA: Diagnosis not present

## 2020-01-18 DIAGNOSIS — R0902 Hypoxemia: Secondary | ICD-10-CM | POA: Diagnosis not present

## 2020-01-18 DIAGNOSIS — Z515 Encounter for palliative care: Secondary | ICD-10-CM | POA: Diagnosis not present

## 2020-01-18 DIAGNOSIS — R413 Other amnesia: Secondary | ICD-10-CM | POA: Diagnosis present

## 2020-01-18 DIAGNOSIS — E039 Hypothyroidism, unspecified: Secondary | ICD-10-CM | POA: Diagnosis present

## 2020-01-18 DIAGNOSIS — Z880 Allergy status to penicillin: Secondary | ICD-10-CM

## 2020-01-18 DIAGNOSIS — R296 Repeated falls: Secondary | ICD-10-CM | POA: Diagnosis present

## 2020-01-18 DIAGNOSIS — Z8249 Family history of ischemic heart disease and other diseases of the circulatory system: Secondary | ICD-10-CM

## 2020-01-18 DIAGNOSIS — F17210 Nicotine dependence, cigarettes, uncomplicated: Secondary | ICD-10-CM | POA: Diagnosis present

## 2020-01-18 DIAGNOSIS — Z79891 Long term (current) use of opiate analgesic: Secondary | ICD-10-CM

## 2020-01-18 DIAGNOSIS — M25551 Pain in right hip: Secondary | ICD-10-CM | POA: Diagnosis not present

## 2020-01-18 DIAGNOSIS — Z8673 Personal history of transient ischemic attack (TIA), and cerebral infarction without residual deficits: Secondary | ICD-10-CM

## 2020-01-18 DIAGNOSIS — G4700 Insomnia, unspecified: Secondary | ICD-10-CM | POA: Diagnosis present

## 2020-01-18 DIAGNOSIS — F0391 Unspecified dementia with behavioral disturbance: Secondary | ICD-10-CM

## 2020-01-18 DIAGNOSIS — S32511A Fracture of superior rim of right pubis, initial encounter for closed fracture: Secondary | ICD-10-CM | POA: Diagnosis not present

## 2020-01-18 DIAGNOSIS — R0600 Dyspnea, unspecified: Secondary | ICD-10-CM

## 2020-01-18 DIAGNOSIS — M25572 Pain in left ankle and joints of left foot: Secondary | ICD-10-CM | POA: Diagnosis not present

## 2020-01-18 DIAGNOSIS — E785 Hyperlipidemia, unspecified: Secondary | ICD-10-CM | POA: Diagnosis present

## 2020-01-18 DIAGNOSIS — Z79899 Other long term (current) drug therapy: Secondary | ICD-10-CM

## 2020-01-18 DIAGNOSIS — S4991XA Unspecified injury of right shoulder and upper arm, initial encounter: Secondary | ICD-10-CM | POA: Diagnosis not present

## 2020-01-18 DIAGNOSIS — W010XXA Fall on same level from slipping, tripping and stumbling without subsequent striking against object, initial encounter: Secondary | ICD-10-CM | POA: Diagnosis present

## 2020-01-18 DIAGNOSIS — Z85828 Personal history of other malignant neoplasm of skin: Secondary | ICD-10-CM

## 2020-01-18 DIAGNOSIS — S3282XA Multiple fractures of pelvis without disruption of pelvic ring, initial encounter for closed fracture: Secondary | ICD-10-CM

## 2020-01-18 DIAGNOSIS — H9190 Unspecified hearing loss, unspecified ear: Secondary | ICD-10-CM | POA: Diagnosis present

## 2020-01-18 DIAGNOSIS — F03918 Unspecified dementia, unspecified severity, with other behavioral disturbance: Secondary | ICD-10-CM | POA: Diagnosis present

## 2020-01-18 DIAGNOSIS — I10 Essential (primary) hypertension: Secondary | ICD-10-CM | POA: Diagnosis not present

## 2020-01-18 DIAGNOSIS — R52 Pain, unspecified: Secondary | ICD-10-CM | POA: Diagnosis not present

## 2020-01-18 DIAGNOSIS — J449 Chronic obstructive pulmonary disease, unspecified: Secondary | ICD-10-CM | POA: Diagnosis present

## 2020-01-18 LAB — CBC WITH DIFFERENTIAL/PLATELET
Abs Immature Granulocytes: 0.05 10*3/uL (ref 0.00–0.07)
Basophils Absolute: 0 10*3/uL (ref 0.0–0.1)
Basophils Relative: 1 %
Eosinophils Absolute: 0.1 10*3/uL (ref 0.0–0.5)
Eosinophils Relative: 2 %
HCT: 43.1 % (ref 36.0–46.0)
Hemoglobin: 14.1 g/dL (ref 12.0–15.0)
Immature Granulocytes: 1 %
Lymphocytes Relative: 22 %
Lymphs Abs: 1.6 10*3/uL (ref 0.7–4.0)
MCH: 29.7 pg (ref 26.0–34.0)
MCHC: 32.7 g/dL (ref 30.0–36.0)
MCV: 90.7 fL (ref 80.0–100.0)
Monocytes Absolute: 0.5 10*3/uL (ref 0.1–1.0)
Monocytes Relative: 7 %
Neutro Abs: 4.7 10*3/uL (ref 1.7–7.7)
Neutrophils Relative %: 67 %
Platelets: 157 10*3/uL (ref 150–400)
RBC: 4.75 MIL/uL (ref 3.87–5.11)
RDW: 13.3 % (ref 11.5–15.5)
WBC: 7 10*3/uL (ref 4.0–10.5)
nRBC: 0 % (ref 0.0–0.2)

## 2020-01-18 LAB — COMPREHENSIVE METABOLIC PANEL
ALT: 16 U/L (ref 0–44)
AST: 19 U/L (ref 15–41)
Albumin: 3.9 g/dL (ref 3.5–5.0)
Alkaline Phosphatase: 100 U/L (ref 38–126)
Anion gap: 8 (ref 5–15)
BUN: 12 mg/dL (ref 8–23)
CO2: 24 mmol/L (ref 22–32)
Calcium: 9.3 mg/dL (ref 8.9–10.3)
Chloride: 107 mmol/L (ref 98–111)
Creatinine, Ser: 0.66 mg/dL (ref 0.44–1.00)
GFR calc Af Amer: >60 mL/min (ref >60)
GFR calc non Af Amer: >60 mL/min (ref >60)
Glucose, Bld: 103 mg/dL — ABNORMAL HIGH (ref 70–99)
Potassium: 4 mmol/L (ref 3.5–5.1)
Sodium: 139 mmol/L (ref 135–145)
Total Bilirubin: 0.7 mg/dL (ref 0.3–1.2)
Total Protein: 6.6 g/dL (ref 6.5–8.1)

## 2020-01-18 LAB — RESPIRATORY PANEL BY RT PCR (FLU A&B, COVID)
Influenza A by PCR: NEGATIVE
Influenza B by PCR: NEGATIVE
SARS Coronavirus 2 by RT PCR: NEGATIVE

## 2020-01-18 LAB — TSH: TSH: 1.047 u[IU]/mL (ref 0.350–4.500)

## 2020-01-18 LAB — BRAIN NATRIURETIC PEPTIDE: B Natriuretic Peptide: 176.7 pg/mL — ABNORMAL HIGH (ref 0.0–100.0)

## 2020-01-18 MED ORDER — LOPERAMIDE HCL 2 MG PO CAPS: 2.0000 mg | ORAL_CAPSULE | ORAL | Status: DC | PRN

## 2020-01-18 MED ORDER — ACETAMINOPHEN 650 MG RE SUPP: 650.0000 mg | Freq: Four times a day (QID) | RECTAL | Status: DC | PRN

## 2020-01-18 MED ORDER — HYDROXYZINE HCL 25 MG PO TABS
50.0000 mg | ORAL_TABLET | Freq: Two times a day (BID) | ORAL | Status: DC | PRN
  Administered 2020-01-23: 50 mg via ORAL
  Filled 2020-01-18: qty 2

## 2020-01-18 MED ORDER — NICOTINE 21 MG/24HR TD PT24
21.0000 mg | MEDICATED_PATCH | Freq: Every day | TRANSDERMAL | Status: DC
  Administered 2020-01-18 – 2020-01-23 (×6): 21 mg via TRANSDERMAL
  Filled 2020-01-18 (×6): qty 1

## 2020-01-18 MED ORDER — QUETIAPINE FUMARATE 25 MG PO TABS
25.0000 mg | ORAL_TABLET | Freq: Every day | ORAL | Status: DC
  Administered 2020-01-18 – 2020-01-24 (×7): 25 mg via ORAL
  Filled 2020-01-18 (×8): qty 1

## 2020-01-18 MED ORDER — PANTOPRAZOLE SODIUM 40 MG PO TBEC
40.0000 mg | DELAYED_RELEASE_TABLET | Freq: Every day | ORAL | Status: DC
  Administered 2020-01-19 – 2020-01-24 (×6): 40 mg via ORAL
  Filled 2020-01-18 (×6): qty 1

## 2020-01-18 MED ORDER — ONDANSETRON HCL 4 MG PO TABS: 4.0000 mg | ORAL_TABLET | Freq: Four times a day (QID) | ORAL | Status: DC | PRN

## 2020-01-18 MED ORDER — ATORVASTATIN CALCIUM 10 MG PO TABS
20.0000 mg | ORAL_TABLET | Freq: Every day | ORAL | Status: DC
  Administered 2020-01-19 – 2020-01-24 (×6): 20 mg via ORAL
  Filled 2020-01-18 (×7): qty 2

## 2020-01-18 MED ORDER — DONEPEZIL HCL 10 MG PO TABS
10.0000 mg | ORAL_TABLET | Freq: Every day | ORAL | Status: DC
  Administered 2020-01-19 – 2020-01-23 (×5): 10 mg via ORAL
  Filled 2020-01-18 (×6): qty 1

## 2020-01-18 MED ORDER — DARIFENACIN HYDROBROMIDE ER 7.5 MG PO TB24
7.5000 mg | ORAL_TABLET | Freq: Every day | ORAL | Status: DC
  Administered 2020-01-19 – 2020-01-24 (×5): 7.5 mg via ORAL
  Filled 2020-01-18 (×7): qty 1

## 2020-01-18 MED ORDER — FENTANYL CITRATE (PF) 100 MCG/2ML IJ SOLN
12.5000 ug | Freq: Once | INTRAMUSCULAR | Status: AC
  Administered 2020-01-18: 14:00:00 12.5 ug via INTRAVENOUS
  Filled 2020-01-18: qty 2

## 2020-01-18 MED ORDER — ACETAMINOPHEN 325 MG PO TABS
650.0000 mg | ORAL_TABLET | Freq: Four times a day (QID) | ORAL | Status: DC | PRN
  Administered 2020-01-18 – 2020-01-23 (×3): 650 mg via ORAL
  Filled 2020-01-18 (×3): qty 2

## 2020-01-18 MED ORDER — VENLAFAXINE HCL ER 75 MG PO CP24
225.0000 mg | ORAL_CAPSULE | Freq: Every day | ORAL | Status: DC
  Administered 2020-01-19 – 2020-01-24 (×6): 225 mg via ORAL
  Filled 2020-01-18 (×8): qty 1

## 2020-01-18 MED ORDER — LORATADINE 10 MG PO TABS
10.0000 mg | ORAL_TABLET | Freq: Every day | ORAL | Status: DC
  Administered 2020-01-19 – 2020-01-24 (×6): 10 mg via ORAL
  Filled 2020-01-18 (×6): qty 1

## 2020-01-18 MED ORDER — TROSPIUM CHLORIDE ER 60 MG PO CP24: 1.0000 | ORAL_CAPSULE | ORAL | Status: DC

## 2020-01-18 MED ORDER — HYDROCODONE-ACETAMINOPHEN 5-325 MG PO TABS
1.0000 | ORAL_TABLET | Freq: Four times a day (QID) | ORAL | Status: DC | PRN
  Administered 2020-01-18 – 2020-01-24 (×16): 1 via ORAL
  Filled 2020-01-18 (×18): qty 1

## 2020-01-18 MED ORDER — LABETALOL HCL 200 MG PO TABS
100.0000 mg | ORAL_TABLET | Freq: Two times a day (BID) | ORAL | Status: DC
  Administered 2020-01-18 – 2020-01-24 (×11): 100 mg via ORAL
  Filled 2020-01-18 (×14): qty 0.5

## 2020-01-18 MED ORDER — ALBUTEROL SULFATE (2.5 MG/3ML) 0.083% IN NEBU: 2.5000 mg | INHALATION_SOLUTION | Freq: Four times a day (QID) | RESPIRATORY_TRACT | Status: DC | PRN

## 2020-01-18 MED ORDER — TROLAMINE SALICYLATE 10 % EX CREA
1.0000 "application " | TOPICAL_CREAM | CUTANEOUS | Status: DC | PRN
  Filled 2020-01-18: qty 85

## 2020-01-18 MED ORDER — FENTANYL CITRATE (PF) 100 MCG/2ML IJ SOLN
25.0000 ug | INTRAMUSCULAR | Status: DC | PRN
  Filled 2020-01-18: qty 2

## 2020-01-18 MED ORDER — TRAZODONE HCL 50 MG PO TABS
200.0000 mg | ORAL_TABLET | Freq: Every day | ORAL | Status: DC
  Administered 2020-01-18 – 2020-01-23 (×6): 200 mg via ORAL
  Filled 2020-01-18 (×6): qty 4

## 2020-01-18 MED ORDER — FENTANYL CITRATE (PF) 100 MCG/2ML IJ SOLN: 25.0000 ug | INTRAMUSCULAR | Status: DC | PRN

## 2020-01-18 MED ORDER — ENOXAPARIN SODIUM 40 MG/0.4ML ~~LOC~~ SOLN
40.0000 mg | SUBCUTANEOUS | Status: DC
  Administered 2020-01-19 – 2020-01-23 (×5): 40 mg via SUBCUTANEOUS
  Filled 2020-01-18 (×5): qty 0.4

## 2020-01-18 MED ORDER — SODIUM CHLORIDE 0.9% FLUSH
3.0000 mL | Freq: Two times a day (BID) | INTRAVENOUS | Status: DC
  Administered 2020-01-18 – 2020-01-24 (×11): 3 mL via INTRAVENOUS

## 2020-01-18 MED ORDER — ONDANSETRON HCL 4 MG/2ML IJ SOLN: 4.0000 mg | Freq: Four times a day (QID) | INTRAMUSCULAR | Status: DC | PRN

## 2020-01-18 MED ORDER — AMLODIPINE BESYLATE 10 MG PO TABS: 10.0000 mg | ORAL_TABLET | Freq: Every day | ORAL | Status: DC

## 2020-01-18 NOTE — ED Triage Notes (Signed)
To ED via [facility] from [facility] [address]- at [place].  Pt stumbled and fell while getting up from table at assisted living facility- c/o neck, back and right hip pain.  Alert/oriented x 4, is short of breath- pt states "I smoke so I always am short of breath"

## 2020-01-18 NOTE — H&P (Addendum)
History and Physical    Kendra Hernandez [id number] DOB: 07/17/1941 DOA: 01/18/2020  Referring MD/NP/PA: Carmin Muskrat, MD PCP: Seward Carol, MD  Patient coming from: Nan[facility] Means at [facility] via EMS  Chief Complaint: Fall  I have personally briefly reviewed patient's old medical records in [place]   HPI: Kendra Hernandez is a 78 y.o. female with medical history significant of hypertension, hyperlipidemia, COPD, and tobacco abuse.  She presents after having a witnessed fall with complaints of pain in her right hip, back, and right shoulder.  At baseline she ambulates with the use of prior to walker and had been using it when she was coming from lunch.  She reports that her feet got tangled up causing her to trip and fall.  She was unable to bear weight on her feet thereafter.  Denies any trauma to her head or lose consciousness.  Patient reports associated symptoms of shortness of breath, but she reports that this is chronic.  Normally she does not require oxygen at home and admits to smoking half pack cigarettes per day on average.  ED Course: On admission into the emergency department patient was seen to be afebrile with O2 saturations 89-91% on room air, and vital signs relatively within normal limits.  Labs were unremarkable.  X-ray imaging revealed right scapula, right superior pubic ramus, and right inferior pelvic ramus fractures.  Orthopedics was consulted.  COVID-19 and influenza screening were negative.  Patient had received 25 mcg of fentanyl.  TRH called to admit.  Review of Systems  Constitutional: Negative for fever and malaise/fatigue.  HENT: Negative for congestion and nosebleeds.   Eyes: Negative for double vision and photophobia.  Respiratory: Positive for shortness of breath.   Cardiovascular: Negative for chest pain and leg swelling.  Gastrointestinal: Negative for diarrhea, nausea and vomiting.  Genitourinary: Negative for dysuria and hematuria.    Musculoskeletal: Positive for falls and joint pain.  Skin: Negative for itching and rash.  Neurological: Negative for loss of consciousness.  Psychiatric/Behavioral: Positive for memory loss and substance abuse.    Past Medical History:  Diagnosis Date   Anxiety    Arthritis    COPD (chronic obstructive pulmonary disease) ([employer])    Hyperlipidemia    Hypertension    Hypothyroid    Insomnia    Reflux    Repeated falls    Skin cancer    Stress    Stroke (cerebrum) ([facility])     Past Surgical History:  Procedure Laterality Date   COLONOSCOPY     With polyp removal    MANDIBLE FRACTURE SURGERY     Placement of partial jaw due to abcess   SKIN CANCER EXCISION     Removal of several skin cancers   TOE SURGERY Right    Removal of toe      reports that she has been smoking cigarettes. She has a 60.00 pack-year smoking history. She has never used smokeless tobacco. She reports previous alcohol use. She reports that she does not use drugs.  Allergies  Allergen Reactions   Ceftin [Cefuroxime Axetil] Other (See Comments)    Reaction:  Unknown    Penicillins Hives and Other (See Comments)    Hives and swelling as a child Has patient had a PCN reaction causing immediate rash, facial/tongue/throat swelling, SOB or lightheadedness with hypotension: Yes Has patient had a PCN reaction causing severe rash involving mucus membranes or skin necrosis: No Has patient had a PCN reaction that required hospitalization No Has  patient had a PCN reaction occurring within the last 10 years: No If all of the above answers are "NO", then may proceed with Cephalosporin use.    Simvastatin Other (See Comments)    Reaction:  Unknown     Family History  Problem Relation Age of Onset   Emphysema Mother    Rheumatologic disease Mother    Heart attack Father    Emphysema Brother    Pancreatic cancer Daughter     Prior to Admission medications   Medication Sig Start Date End  Date Taking? Authorizing Provider  acetaminophen (TYLENOL) 500 MG tablet Take 1,000 mg by mouth 2 (two) times daily.    Yes [provider]  acetaminophen (TYLENOL) 500 MG tablet Take 500 mg by mouth every 8 (eight) hours as needed for mild pain.   Yes [provider]  amLODipine (NORVASC) 10 MG tablet Take 10 mg by mouth daily.   Yes [provider]  atorvastatin (LIPITOR) 20 MG tablet Take 20 mg by mouth daily.   Yes [provider]  donepezil (ARICEPT) 10 MG tablet Take 1 tablet (10 mg total) daily by mouth. After meal 11/11/17  Yes Marcial Pacas, MD  hydrOXYzine (ATARAX/VISTARIL) 50 MG tablet Take 1 tablet by mouth every 12 (twelve) hours as needed for anxiety.  06/24/17  Yes [provider]  labetalol (NORMODYNE) 100 MG tablet Take 100 mg by mouth at bedtime.   Yes [provider]  labetalol (NORMODYNE) 200 MG tablet Take 100 mg by mouth 2 (two) times daily.    Yes [provider]  loperamide (IMODIUM) 2 MG capsule Take 2 mg by mouth as needed for diarrhea or loose stools.   Yes [provider]  loratadine (CLARITIN) 10 MG tablet Take 10 mg by mouth daily.   Yes [provider]  omeprazole (PRILOSEC) 40 MG capsule Take 40 mg by mouth daily.   Yes [provider]  QUEtiapine (SEROQUEL) 25 MG tablet Take 25 mg by mouth daily.    Yes [provider]  sodium chloride (OCEAN) 0.65 % SOLN nasal spray Place 2 sprays into both nostrils as needed for congestion.   Yes [provider]  traMADol (ULTRAM) 50 MG tablet Take 50 mg by mouth every 4 (four) hours as needed (break through pain).   Yes [provider]  traZODone (DESYREL) 100 MG tablet Take 200 mg by mouth at bedtime.    Yes [provider]  trolamine salicylate (ASPERCREME) 10 % cream Apply 1 application topically as needed for muscle pain.   Yes [provider]  Trospium Chloride 60 MG CP24 Take 1 capsule by mouth  every morning.   Yes [provider]  venlafaxine XR (EFFEXOR-XR) 75 MG 24 hr capsule Take 225 mg by mouth daily with breakfast.   Yes [provider]    Physical Exam:  Constitutional: Elderly female who appears to be in no acute distress at this time. Vitals:   01/18/20 1312 01/18/20 1322 01/18/20 1323  BP: (!) 142/96    Pulse: 86    Resp: 16    Temp: 98.6 F (37 C)    TempSrc: Oral    SpO2: 94% 93%   Weight:   56.7 kg  Height:   5\' 5"  (1.651 m)   Eyes: PERRL, lids and conjunctivae normal ENMT: Mucous membranes are dry. Posterior pharynx clear of any exudate or lesions.poor dentition with multiple missing teeth Neck: normal, supple, no masses, no thyromegaly Respiratory: Mildly decreased  aeration with no significant wheezes or rhonchi appreciated.  Normal respiratory effort. No accessory muscle use.  Cardiovascular: Regular rate and rhythm, no murmurs / rubs / gallops. No extremity edema. 2+ pedal pulses. No carotid bruits.  Abdomen: no tenderness, no masses palpated. No hepatosplenomegaly. Bowel sounds positive.  Musculoskeletal: no clubbing / cyanosis.  Tenderness to palpation of the right shoulder and right side of the pelvis. Skin: no rashes, lesions, ulcers. No induration Neurologic: CN 2-12 grossly intact. Sensation intact, DTR normal. Strength 5/5 in all 4.  Psychiatric: Past memories and events leading up to her fall intact. Alert and oriented x 3. Normal mood.     Labs on Admission: I have personally reviewed following labs and imaging studies  CBC: Recent Labs  Lab 01/18/20 1413  WBC 7.0  NEUTROABS 4.7  HGB 14.1  HCT 43.1  MCV 90.7  PLT [id number]   Basic Metabolic Panel: Recent Labs  Lab 01/18/20 1413  NA 139  K 4.0  CL 107  CO2 24  GLUCOSE 103*  BUN 12  CREATININE 0.66  CALCIUM 9.3   GFR: Estimated Creatinine Clearance: 53.6 mL/min (by C-G formula based on SCr of 0.66 mg/dL). Liver Function Tests: Recent Labs  Lab 01/18/20 1413    AST 19  ALT 16  ALKPHOS 100  BILITOT 0.7  PROT 6.6  ALBUMIN 3.9   No results for input(s): LIPASE, AMYLASE in the last 168 hours. No results for input(s): AMMONIA in the last 168 hours. Coagulation Profile: No results for input(s): INR, PROTIME in the last 168 hours. Cardiac Enzymes: No results for input(s): CKTOTAL, CKMB, CKMBINDEX, TROPONINI in the last 168 hours. BNP (last 3 results) No results for input(s): PROBNP in the last 8760 hours. HbA1C: No results for input(s): HGBA1C in the last 72 hours. CBG: No results for input(s): GLUCAP in the last 168 hours. Lipid Profile: No results for input(s): CHOL, HDL, LDLCALC, TRIG, CHOLHDL, LDLDIRECT in the last 72 hours. Thyroid Function Tests: No results for input(s): TSH, T4TOTAL, FREET4, T3FREE, THYROIDAB in the last 72 hours. Anemia Panel: No results for input(s): VITAMINB12, FOLATE, FERRITIN, TIBC, IRON, RETICCTPCT in the last 72 hours. Urine analysis:    Component Value Date/Time   COLORURINE YELLOW 06/26/2017 2151   APPEARANCEUR HAZY (A) 06/26/2017 2151   LABSPEC 1.011 06/26/2017 2151   PHURINE 7.0 06/26/2017 2151   GLUCOSEU NEGATIVE 06/26/2017 2151   HGBUR NEGATIVE 06/26/2017 2151   [place] 06/26/2017 2151   KETONESUR 20 (A) 06/26/2017 2151   PROTEINUR NEGATIVE 06/26/2017 2151   NITRITE NEGATIVE 06/26/2017 2151   LEUKOCYTESUR NEGATIVE 06/26/2017 2151   Sepsis Labs: Recent Results (from the past 240 hour(s))  Respiratory Panel by RT PCR (Flu A&B, Covid) - Nasopharyngeal Swab     Status: None   Collection Time: 01/18/20  2:35 PM   Specimen: Nasopharyngeal Swab  Result Value Ref Range Status   SARS Coronavirus 2 by RT PCR NEGATIVE NEGATIVE Final    Comment: (NOTE) SARS-CoV-2 target nucleic acids are NOT DETECTED. The SARS-CoV-2 RNA is generally detectable in upper respiratoy specimens during the acute phase of infection. The lowest concentration of SARS-CoV-2 viral copies this assay can detect is 131  copies/mL. A negative result does not preclude SARS-Cov-2 infection and should not be used as the sole basis for treatment or other patient management decisions. A negative result may occur with  improper specimen collection/handling, submission of specimen other than nasopharyngeal swab, presence of viral mutation(s) within the areas targeted by this assay,  and inadequate number of viral copies (<131 copies/mL). A negative result must be combined with clinical observations, patient history, and epidemiological information. The expected result is Negative. Fact Sheet for Patients:  PinkCheek.be Fact Sheet for Healthcare Providers:  GravelBags.it This test is not yet ap proved or cleared by the [place] FDA and  has been authorized for detection and/or diagnosis of SARS-CoV-2 by FDA under an Emergency Use Authorization (EUA). This EUA will remain  in effect (meaning this test can be used) for the duration of the COVID-19 declaration under Section 564(b)(1) of the Act, 21 U.S.C. section 360bbb-3(b)(1), unless the authorization is terminated or revoked sooner.    Influenza A by PCR NEGATIVE NEGATIVE Final   Influenza B by PCR NEGATIVE NEGATIVE Final    Comment: (NOTE) The Xpert Xpress SARS-CoV-2/FLU/RSV assay is intended as an aid in  the diagnosis of influenza from Nasopharyngeal swab specimens and  should not be used as a sole basis for treatment. Nasal washings and  aspirates are unacceptable for Xpert Xpress SARS-CoV-2/FLU/RSV  testing. Fact Sheet for Patients: PinkCheek.be Fact Sheet for Healthcare Providers: GravelBags.it This test is not yet approved or cleared by the [place] FDA and  has been authorized for detection and/or diagnosis of SARS-CoV-2 by  FDA under an Emergency Use Authorization (EUA). This EUA will remain  in effect (meaning this test can  be used) for the duration of the  Covid-19 declaration under Section 564(b)(1) of the Act, 21  U.S.C. section 360bbb-3(b)(1), unless the authorization is  terminated or revoked. Performed at [facility], [place] [address] [employer], [place] [address]      Radiological Exams on Admission: DG Chest 1 View  Result Date: 01/18/2020 CLINICAL DATA:  Fall, back pain, shortness of breath EXAM: CHEST  1 VIEW COMPARISON:  06/18/2017 FINDINGS: The heart size and mediastinal contours are within normal limits. Opacity with bubbly internal contents at the medial right lung base compatible with known moderate-sized hiatal hernia. Both lungs are otherwise clear. No pleural effusion or pneumothorax. Abnormal contour of the inferior aspect of the right glenoid neck concerning for fracture. IMPRESSION: 1. No active cardiopulmonary disease. 2. Abnormal cortical contour of the right scapula at the inferior glenoid neck concerning for fracture. 3. Hiatal hernia. Electronically Signed   By: Davina Poke D.O.   On: 01/18/2020 15:47   DG Shoulder Right  Result Date: 01/18/2020 CLINICAL DATA:  Shoulder pain after fall EXAM: RIGHT SHOULDER - 2+ VIEW COMPARISON:  07/16/2016 FINDINGS: Cortical irregularity with subtle angulation of the right scapula at the inferior glenoid neck, which was slightly more conspicuous on the frontal chest radiograph which was obtained concurrently. Glenohumeral joint is intact without fracture or dislocation. AC joint is intact. Soft tissues appear within normal limits. IMPRESSION: Cortical irregularity with subtle angulation of the right scapula at the inferior glenoid neck, which raises suspicion for a nondisplaced fracture. If further evaluation of this area is clinically warranted, CT can be performed. Electronically Signed   By: Davina Poke D.O.   On: 01/18/2020 15:51   CT Cervical Spine Wo Contrast  Result Date: 01/18/2020 CLINICAL DATA:  79 year old female with trauma. EXAM:  CT CERVICAL SPINE WITHOUT CONTRAST TECHNIQUE: Multidetector CT imaging of the cervical spine was performed without intravenous contrast. Multiplanar CT image reconstructions were also generated. COMPARISON:  CT of the cervical spine dated 08/08/2018. FINDINGS: Evaluation of this exam is limited due to motion artifact. Alignment: No acute subluxation.  Grade 1 C4-C5 anterolisthesis. Skull base  and vertebrae: No acute fracture.  Osteopenia. Soft tissues and spinal canal: No prevertebral fluid or swelling. No visible canal hematoma. Disc levels: Multilevel degenerative changes with endplate irregularity and disc space narrowing. There is multilevel facet arthropathy Upper chest: Mild emphysema. Other: Bilateral carotid bulb calcified plaques. IMPRESSION: 1. No definite acute/traumatic cervical spine pathology. 2. Multiple degenerative changes. Electronically Signed   By: Anner Crete M.D.   On: 01/18/2020 15:37   DG Hip Unilat With Pelvis 2-3 Views Right  Result Date: 01/18/2020 CLINICAL DATA:  Fall, right hip pain EXAM: DG HIP (WITH OR WITHOUT PELVIS) 2-3V RIGHT COMPARISON:  CT 10/25/2016 FINDINGS: Mildly displaced fractures of the right superior and inferior pubic rami, which are new from prior. Pubic symphysis is intact without diastasis. SI joints are intact. Right hip joint is intact without fracture or dislocation. Bones are demineralized. Vascular calcifications. IMPRESSION: Mildly displaced fractures of the right superior and inferior pubic rami, which are new from prior. Electronically Signed   By: Davina Poke D.O.   On: 01/18/2020 15:42    EKG: Independently reviewed.  77 bpm with paired PVCs.  Assessment/Plan Right pubic ramus fractures and right scapular fracture secondary to fall: Acute.  Patient presents after having a witnessed fall where she reports her feet got tangled up.  At baseline patient walks with use of a walker.  X-rays revealed right scapular fracture, right superior pubic  ramus, and inferior pubis right wrist fractures.  Orthopedics was consulted recommended weightbearing as tolerated. -Admit to a telemetry bed  -Hydrocodone/fentanyl as needed for moderate to severe pain respectively -PT/OT to evaluate and treat -Social work consult for placement -Appreciate orthopedic consultative services, we will follow-up for any further recommendation  Hypoxia: On admission O2 saturations noted to be 89 to 91% on room air.  Chest x-ray showed a right scapular fracture.  Some of symptoms may be secondary to pain. -Continuous pulse oximetry with nasal cannula oxygen to maintain O2 saturation greater than 90% -Incentive spirometry -May need to evaluate for need of home oxygen.  Essential hypertension: Pressures currently stable Home medications include labetalol 100 mg twice daily and amlodipine 10 mg daily. -Continue current home regimen  COPD, without acute exacerbation: Patient with history of tobacco use. -Albuterol inhaler as needed  Hyperlipidemia: Patient on atorvastatin 20 mg daily. -Continue atorvastatin  Dementia -Continue Aricept  Tobacco abuse: Patient reports smoking half pack cigarettes per day on average. -Nicotine patch -Counseled is on the need of cessation of tobacco use  Insomnia -Continue trazodone and Seroquel  GERD -Continue pharmacy substitution for omeprazole DVT prophylaxis: Lovenox Code Status: Full Family Communication: Present at bedside Disposition Plan: SNF Consults called: ortho Admission status:Observation  Norval Morton MD [facility] Pager [phone]   If 7PM-7AM, please contact night-coverage [email or web address] Password [facility]  01/18/2020, 4:12 PM

## 2020-01-18 NOTE — ED Notes (Signed)
Dinner Tray Ordered @ 1740. 

## 2020-01-18 NOTE — Consult Note (Signed)
Reason for Consult:  Right hip pain Referring Physician: Dr. Bernadene Bell Kendra Hernandez is an 79 y.o. female.  HPI: 79 y/o female with PMH of smoking and COPD fell at home earlier today.  She c/o R groin pain and pain at the right shoulder.  She denies h/o injury or surgery to her right hip or shoulder in the past.  She is not diabetic.  Pain is worse with motion and attempt at [facility] and better with rest.  Past Medical History:  Diagnosis Date  . Anxiety   . Arthritis   . COPD (chronic obstructive pulmonary disease) ([place])   . Hyperlipidemia   . Hypertension   . Hypothyroid   . Insomnia   . Reflux   . Repeated falls   . Skin cancer   . Stress   . Stroke (cerebrum) [facility])     Past Surgical History:  Procedure Laterality Date  . COLONOSCOPY     With polyp removal   . MANDIBLE FRACTURE SURGERY     Placement of partial jaw due to abcess  . SKIN CANCER EXCISION     Removal of several skin cancers  . TOE SURGERY Right    Removal of toe     Family History  Problem Relation Age of Onset  . Emphysema Mother   . Rheumatologic disease Mother   . Heart attack Father   . Emphysema Brother   . Pancreatic cancer Daughter     Social History:  reports that she has been smoking cigarettes. She has a 60.00 pack-year smoking history. She has never used smokeless tobacco. She reports previous alcohol use. She reports that she does not use drugs.  Allergies:  Allergies  Allergen Reactions  . Penicillins Hives and Other (See Comments)    Hives and swelling as a child Has patient had a PCN reaction causing immediate rash, facial/tongue/throat swelling, SOB or lightheadedness with hypotension: Yes Has patient had a PCN reaction causing severe rash involving mucus membranes or skin necrosis: No Has patient had a PCN reaction that required hospitalization No Has patient had a PCN reaction occurring within the last 10 years: No If all of the above answers are "NO", then may proceed with Cephalosporin  use.   . Ceftin [Cefuroxime Axetil] Other (See Comments)    In Epic since 2017, so leaving this (allergy not noted on MAR in 2021??)  . Simvastatin Other (See Comments)    In Epic since 2017, so leaving this (allergy not noted on MAR in 2021??)    Medications: I have reviewed the patient's current medications.  Results for orders placed or performed during the hospital encounter of 01/18/20 (from the past 48 hour(s))  Comprehensive metabolic panel     Status: Abnormal   Collection Time: 01/18/20  2:13 PM  Result Value Ref Range   Sodium 139 135 - 145 mmol/L   Potassium 4.0 3.5 - 5.1 mmol/L   Chloride 107 98 - 111 mmol/L   CO2 24 22 - 32 mmol/L   Glucose, Bld 103 (H) 70 - 99 mg/dL   BUN 12 8 - 23 mg/dL   Creatinine, Ser 0.66 0.44 - 1.00 mg/dL   Calcium 9.3 8.9 - 10.3 mg/dL   Total Protein 6.6 6.5 - 8.1 g/dL   Albumin 3.9 3.5 - 5.0 g/dL   AST 19 15 - 41 U/L   ALT 16 0 - 44 U/L   Alkaline Phosphatase 100 38 - 126 U/L   Total Bilirubin 0.7 0.3 - 1.2  mg/dL   GFR calc non Af Amer >60 >60 mL/min   GFR calc Af Amer >60 >60 mL/min   Anion gap 8 5 - 15    Comment: Performed at [facility] [address], [place], [place] [address]  CBC with Differential     Status: None   Collection Time: 01/18/20  2:13 PM  Result Value Ref Range   WBC 7.0 4.0 - 10.5 K/uL   RBC 4.75 3.87 - 5.11 MIL/uL   Hemoglobin 14.1 12.0 - 15.0 g/dL   HCT 43.1 36.0 - 46.0 %   MCV 90.7 80.0 - 100.0 fL   MCH 29.7 26.0 - 34.0 pg   MCHC 32.7 30.0 - 36.0 g/dL   RDW 13.3 11.5 - 15.5 %   Platelets 157 150 - 400 K/uL   nRBC 0.0 0.0 - 0.2 %   Neutrophils Relative % 67 %   Neutro Abs 4.7 1.7 - 7.7 K/uL   Lymphocytes Relative 22 %   Lymphs Abs 1.6 0.7 - 4.0 K/uL   Monocytes Relative 7 %   Monocytes Absolute 0.5 0.1 - 1.0 K/uL   Eosinophils Relative 2 %   Eosinophils Absolute 0.1 0.0 - 0.5 K/uL   Basophils Relative 1 %   Basophils Absolute 0.0 0.0 - 0.1 K/uL   Immature Granulocytes 1 %   Abs Immature  Granulocytes 0.05 0.00 - 0.07 K/uL    Comment: Performed at [facility], [address], [place], [place] [address]  Brain natriuretic peptide     Status: Abnormal   Collection Time: 01/18/20  2:13 PM  Result Value Ref Range   B Natriuretic Peptide 176.7 (H) 0.0 - 100.0 pg/mL    Comment: Performed at [facility] [address] [place], [place] [address]  Respiratory Panel by RT PCR (Flu A&B, Covid) - Nasopharyngeal Swab     Status: None   Collection Time: 01/18/20  2:35 PM   Specimen: Nasopharyngeal Swab  Result Value Ref Range   SARS Coronavirus 2 by RT PCR NEGATIVE NEGATIVE    Comment: (NOTE) SARS-CoV-2 target nucleic acids are NOT DETECTED. The SARS-CoV-2 RNA is generally detectable in upper respiratoy specimens during the acute phase of infection. The lowest concentration of SARS-CoV-2 viral copies this assay can detect is 131 copies/mL. A negative result does not preclude SARS-Cov-2 infection and should not be used as the sole basis for treatment or other patient management decisions. A negative result may occur with  improper specimen collection/handling, submission of specimen other than nasopharyngeal swab, presence of viral mutation(s) within the areas targeted by this assay, and inadequate number of viral copies (<131 copies/mL). A negative result must be combined with clinical observations, patient history, and epidemiological information. The expected result is Negative. Fact Sheet for Patients:  PinkCheek.be Fact Sheet for Healthcare Providers:  GravelBags.it This test is not yet ap proved or cleared by the [place] FDA and  has been authorized for detection and/or diagnosis of SARS-CoV-2 by FDA under an Emergency Use Authorization (EUA). This EUA will remain  in effect (meaning this test can be used) for the duration of the COVID-19 declaration under Section 564(b)(1) of the Act, 21  U.S.C. section 360bbb-3(b)(1), unless the authorization is terminated or revoked sooner.    Influenza A by PCR NEGATIVE NEGATIVE   Influenza B by PCR NEGATIVE NEGATIVE    Comment: (NOTE) The Xpert Xpress SARS-CoV-2/FLU/RSV assay is intended as an aid in  the diagnosis of influenza from  Nasopharyngeal swab specimens and  should not be used as a sole basis for treatment. Nasal washings and  aspirates are unacceptable for Xpert Xpress SARS-CoV-2/FLU/RSV  testing. Fact Sheet for Patients: PinkCheek.be Fact Sheet for Healthcare Providers: GravelBags.it This test is not yet approved or cleared by the [place] FDA and  has been authorized for detection and/or diagnosis of SARS-CoV-2 by  FDA under an Emergency Use Authorization (EUA). This EUA will remain  in effect (meaning this test can be used) for the duration of the  Covid-19 declaration under Section 564(b)(1) of the Act, 21  U.S.C. section 360bbb-3(b)(1), unless the authorization is  terminated or revoked. Performed at [facility], [place] [address], [place], [place] [address]     DG Chest 1 View  Result Date: 01/18/2020 CLINICAL DATA:  Fall, back pain, shortness of breath EXAM: CHEST  1 VIEW COMPARISON:  06/18/2017 FINDINGS: The heart size and mediastinal contours are within normal limits. Opacity with bubbly internal contents at the medial right lung base compatible with known moderate-sized hiatal hernia. Both lungs are otherwise clear. No pleural effusion or pneumothorax. Abnormal contour of the inferior aspect of the right glenoid neck concerning for fracture. IMPRESSION: 1. No active cardiopulmonary disease. 2. Abnormal cortical contour of the right scapula at the inferior glenoid neck concerning for fracture. 3. Hiatal hernia. Electronically Signed   By: Davina Poke D.O.   On: 01/18/2020 15:47   DG Shoulder Right  Result Date: 01/18/2020 CLINICAL DATA:   Shoulder pain after fall EXAM: RIGHT SHOULDER - 2+ VIEW COMPARISON:  07/16/2016 FINDINGS: Cortical irregularity with subtle angulation of the right scapula at the inferior glenoid neck, which was slightly more conspicuous on the frontal chest radiograph which was obtained concurrently. Glenohumeral joint is intact without fracture or dislocation. AC joint is intact. Soft tissues appear within normal limits. IMPRESSION: Cortical irregularity with subtle angulation of the right scapula at the inferior glenoid neck, which raises suspicion for a nondisplaced fracture. If further evaluation of this area is clinically warranted, CT can be performed. Electronically Signed   By: Davina Poke D.O.   On: 01/18/2020 15:51   CT Cervical Spine Wo Contrast  Result Date: 01/18/2020 CLINICAL DATA:  79 year old female with trauma. EXAM: CT CERVICAL SPINE WITHOUT CONTRAST TECHNIQUE: Multidetector CT imaging of the cervical spine was performed without intravenous contrast. Multiplanar CT image reconstructions were also generated. COMPARISON:  CT of the cervical spine dated 08/08/2018. FINDINGS: Evaluation of this exam is limited due to motion artifact. Alignment: No acute subluxation.  Grade 1 C4-C5 anterolisthesis. Skull base and vertebrae: No acute fracture.  Osteopenia. Soft tissues and spinal canal: No prevertebral fluid or swelling. No visible canal hematoma. Disc levels: Multilevel degenerative changes with endplate irregularity and disc space narrowing. There is multilevel facet arthropathy Upper chest: Mild emphysema. Other: Bilateral carotid bulb calcified plaques. IMPRESSION: 1. No definite acute/traumatic cervical spine pathology. 2. Multiple degenerative changes. Electronically Signed   By: Anner Crete M.D.   On: 01/18/2020 15:37   DG Hip Unilat With Pelvis 2-3 Views Right  Result Date: 01/18/2020 CLINICAL DATA:  Fall, right hip pain EXAM: DG HIP (WITH OR WITHOUT PELVIS) 2-3V RIGHT COMPARISON:  CT  10/25/2016 FINDINGS: Mildly displaced fractures of the right superior and inferior pubic rami, which are new from prior. Pubic symphysis is intact without diastasis. SI joints are intact. Right hip joint is intact without fracture or dislocation. Bones are demineralized. Vascular calcifications. IMPRESSION: Mildly displaced fractures of the right superior  and inferior pubic rami, which are new from prior. Electronically Signed   By: Davina Poke D.O.   On: 01/18/2020 15:42    ROS:  No recent f/c/n/v/wt loss PE:  Blood pressure 137/64, pulse 66, temperature 99.2 F (37.3 C), temperature source Oral, resp. rate 14, height 5\' 5"  (1.651 m), weight 56.7 kg, SpO2 95 %. WN WD elderly woman in nad.  A and O x 4.  Mood and affect normal.  EOMI.  resp unlabored.  HOH.  R LE with intact pulses and intact sens to LT.  No lymphadenopathy.  TTP at the right scapula.  ROM at the right shoulder limited by pain.   Assessment/Plan: R nondisplaced scapula fracture and nondisplaced right superior and inferior pubic ramus fractures.  These fractures can be managed safely in closed fashion.  Sling to the R UE for comfort.  Gentle pendulum exercises.  WBAT B LEs.  PT for ambulation.  F/u in the office in 2-3 weeks.  Pain control as needed.  Kendra Hernandez 01/18/2020, 10:41 PM

## 2020-01-18 NOTE — ED Provider Notes (Addendum)
[facility] EMERGENCY DEPARTMENT Provider Note   CSN: [id number] Arrival date & time: 01/18/20  1311     History Chief Complaint  Patient presents with   Fall   Shortness of Breath    Kendra Hernandez is a 79 y.o. female.  HPI   Patient presents after fall with pain in multiple areas and dyspnea. Initially it seems as though the dyspnea may be new, but it seems chronic after repeated interview. Patient had mechanical fall, just prior to ED arrival. She resides in a nursing facility. Fall was witnessed. She notes that she slipped, fell, striking her right side, posterior. Since the fall she has had soreness in her right hip, back, shoulder.  No new weakness in any extremity, though she does have no ambulation since the fall. She denies loss of sensation anywhere, new confusion, headache. The pain in all of the above areas is sore, mild, though worse with motion or palpation.  Patient notes a history of cigarette addiction, COPD. Per report the patient had mild hypoxia, 89/91% on room air in transfer.  Past Medical History:  Diagnosis Date   Anxiety    Arthritis    COPD (chronic obstructive pulmonary disease) ([place])    Hyperlipidemia    Hypertension    Hypothyroid    Insomnia    Reflux    Repeated falls    Skin cancer    Stress    Stroke (cerebrum) [facility])     Patient Active Problem List   Diagnosis Date Noted   Dementia with behavioral disturbance ([place]) 11/11/2017   Chronic ischemic right MCA stroke 11/11/2017   Alcoholism, chronic ([place]) 06/27/2017   Isopropyl alcohol poisoning    Major depressive disorder, recurrent episode ([place]) 12/21/2016   Major depressive disorder, recurrent episode, severe ([place]) 11/24/2016   Alcohol withdrawal ([place]) 07/17/2016   Withdrawal symptoms, alcohol ([place]) 07/17/2016   Hypothyroidism 07/17/2016   History of CVA (cerebrovascular accident) 07/17/2016   Head contusion 07/17/2016   Fall  07/17/2016   Insomnia 07/17/2016   Essential hypertension 07/17/2016   Abnormality of gait 01/24/2016   Memory loss 01/24/2016   Acute right MCA stroke ([place]) 01/09/2016   Essential hypertension, benign 01/09/2016   Hyperlipidemia LDL goal <100 01/09/2016   Other specified hypothyroidism 01/09/2016   Cervicalgia 01/09/2016   Esophageal reflux 01/09/2016   Major depression, chronic 01/09/2016    Past Surgical History:  Procedure Laterality Date   COLONOSCOPY     With polyp removal    MANDIBLE FRACTURE SURGERY     Placement of partial jaw due to abcess   SKIN CANCER EXCISION     Removal of several skin cancers   TOE SURGERY Right    Removal of toe      OB History   No obstetric history on file.     Family History  Problem Relation Age of Onset   Emphysema Mother    Rheumatologic disease Mother    Heart attack Father    Emphysema Brother    Pancreatic cancer Daughter     Social History   Tobacco Use   Smoking status: Current Every Day Smoker    Packs/day: 2.00    Years: 30.00    Pack years: 60.00    Types: Cigarettes   Smokeless tobacco: Never Used  Substance Use Topics   Alcohol use: Not Currently    Alcohol/week: 0.0 standard drinks    Comment: last drink 2 wks ago 11-05-16, but is uncontrollable.   Drug use:  No    Home Medications Prior to Admission medications   Medication Sig Start Date End Date Taking? Authorizing Provider  acetaminophen (TYLENOL) 500 MG tablet Take 1,000 mg by mouth 2 (two) times daily.    Yes [provider]  acetaminophen (TYLENOL) 500 MG tablet Take 500 mg by mouth every 8 (eight) hours as needed for mild pain.   Yes [provider]  amLODipine (NORVASC) 10 MG tablet Take 10 mg by mouth daily.   Yes [provider]  atorvastatin (LIPITOR) 20 MG tablet Take 20 mg by mouth daily.   Yes [provider]  donepezil (ARICEPT) 10 MG tablet Take 1 tablet (10 mg total) daily by  mouth. After meal 11/11/17  Yes Marcial Pacas, MD  hydrOXYzine (ATARAX/VISTARIL) 50 MG tablet Take 1 tablet by mouth every 12 (twelve) hours as needed for anxiety.  06/24/17  Yes [provider]  labetalol (NORMODYNE) 100 MG tablet Take 100 mg by mouth at bedtime.   Yes [provider]  labetalol (NORMODYNE) 200 MG tablet Take 100 mg by mouth 2 (two) times daily.    Yes [provider]  loperamide (IMODIUM) 2 MG capsule Take 2 mg by mouth as needed for diarrhea or loose stools.   Yes [provider]  loratadine (CLARITIN) 10 MG tablet Take 10 mg by mouth daily.   Yes [provider]  omeprazole (PRILOSEC) 40 MG capsule Take 40 mg by mouth daily.   Yes [provider]  QUEtiapine (SEROQUEL) 25 MG tablet Take 25 mg by mouth daily.    Yes [provider]  sodium chloride (OCEAN) 0.65 % SOLN nasal spray Place 2 sprays into both nostrils as needed for congestion.   Yes [provider]  traMADol (ULTRAM) 50 MG tablet Take 50 mg by mouth every 4 (four) hours as needed (break through pain).   Yes [provider]  traZODone (DESYREL) 100 MG tablet Take 200 mg by mouth at bedtime.    Yes [provider]  trolamine salicylate (ASPERCREME) 10 % cream Apply 1 application topically as needed for muscle pain.   Yes [provider]  Trospium Chloride 60 MG CP24 Take 1 capsule by mouth every morning.   Yes [provider]  venlafaxine XR (EFFEXOR-XR) 75 MG 24 hr capsule Take 225 mg by mouth daily with breakfast.   Yes [provider]    Allergies    Ceftin [cefuroxime axetil], Penicillins, and Simvastatin  Review of Systems   Review of Systems  Constitutional:       Per HPI, otherwise negative  HENT:       Per HPI, otherwise negative  Respiratory:       Per HPI, otherwise negative  Cardiovascular:       Per HPI, otherwise negative  Gastrointestinal: Negative for vomiting.  Endocrine:        Negative aside from HPI  Genitourinary:       Neg aside from HPI   Musculoskeletal:       Per HPI, otherwise negative  Skin: Negative.  Negative for wound.  Neurological: Positive for weakness. Negative for syncope.    Physical Exam Updated Vital Signs BP (!) 142/96 (BP Location: Right Arm)    Pulse 86    Temp 98.6 F (37 C) (Oral)    Resp 16    Ht 5\' 5"  (1.651 m)    Wt 56.7 kg    SpO2 93%    BMI 20.80 kg/m   Physical  Exam Vitals and nursing note reviewed.  Constitutional:      General: She is not in acute distress.    Appearance: She is well-developed.  HENT:     Head: Normocephalic and atraumatic.  Eyes:     Conjunctiva/sclera: Conjunctivae normal.  Cardiovascular:     Rate and Rhythm: Normal rate and regular rhythm.  Pulmonary:     Effort: Pulmonary effort is normal. No respiratory distress.     Breath sounds: Normal breath sounds. No stridor.  Abdominal:     General: There is no distension.  Musculoskeletal:     Cervical back: No spinous process tenderness or muscular tenderness.     Comments: No gross deformities, though the patient has tenderness to palpation in the right shoulder.  She lifts both arms without apparent limitation, has appropriate strength in the upper extremities. Left lower extremity grossly unremarkable, right lower extremity tender to palpation lateral hip.  Patient unwilling to flex the hip secondary to pain.  Knee, ankle unremarkable on the right side.  Skin:    General: Skin is warm and dry.  Neurological:     Mental Status: She is alert and oriented to person, place, and time.     Cranial Nerves: No cranial nerve deficit.     ED Results / Procedures / Treatments   Labs (all labs ordered are listed, but only abnormal results are displayed) Labs Reviewed  COMPREHENSIVE METABOLIC PANEL - Abnormal; Notable for the following components:      Result Value   Glucose, Bld 103 (*)    All other components within normal limits  BRAIN NATRIURETIC  PEPTIDE - Abnormal; Notable for the following components:   B Natriuretic Peptide 176.7 (*)    All other components within normal limits  RESPIRATORY PANEL BY RT PCR (FLU A&B, COVID)  CBC WITH DIFFERENTIAL/PLATELET    EKG EKG Interpretation  Date/Time:  Tuesday January 18 2020 16:45:33 EST Ventricular Rate:  77 PR Interval:    QRS Duration: 116 QT Interval:  337 QTC Calculation: 382 R Axis:   51 Text Interpretation: Sinus rhythm Paired ventricular premature complexes Nonspecific intraventricular conduction delay Probable anterior infarct, age indeterminate Artifact Abnormal ECG Confirmed by Carmin Muskrat [phone]) on 01/18/2020 4:50:07 PM   Radiology DG Chest 1 View  Result Date: 01/18/2020 CLINICAL DATA:  Fall, back pain, shortness of breath EXAM: CHEST  1 VIEW COMPARISON:  06/18/2017 FINDINGS: The heart size and mediastinal contours are within normal limits. Opacity with bubbly internal contents at the medial right lung base compatible with known moderate-sized hiatal hernia. Both lungs are otherwise clear. No pleural effusion or pneumothorax. Abnormal contour of the inferior aspect of the right glenoid neck concerning for fracture. IMPRESSION: 1. No active cardiopulmonary disease. 2. Abnormal cortical contour of the right scapula at the inferior glenoid neck concerning for fracture. 3. Hiatal hernia. Electronically Signed   By: Davina Poke D.O.   On: 01/18/2020 15:47   DG Shoulder Right  Result Date: 01/18/2020 CLINICAL DATA:  Shoulder pain after fall EXAM: RIGHT SHOULDER - 2+ VIEW COMPARISON:  07/16/2016 FINDINGS: Cortical irregularity with subtle angulation of the right scapula at the inferior glenoid neck, which was slightly more conspicuous on the frontal chest radiograph which was obtained concurrently. Glenohumeral joint is intact without fracture or dislocation. AC joint is intact. Soft tissues appear within normal limits. IMPRESSION: Cortical irregularity with subtle  angulation of the right scapula at the inferior glenoid neck, which raises suspicion for a nondisplaced fracture. If further evaluation  of this area is clinically warranted, CT can be performed. Electronically Signed   By: Davina Poke D.O.   On: 01/18/2020 15:51   CT Cervical Spine Wo Contrast  Result Date: 01/18/2020 CLINICAL DATA:  79 year old female with trauma. EXAM: CT CERVICAL SPINE WITHOUT CONTRAST TECHNIQUE: Multidetector CT imaging of the cervical spine was performed without intravenous contrast. Multiplanar CT image reconstructions were also generated. COMPARISON:  CT of the cervical spine dated 08/08/2018. FINDINGS: Evaluation of this exam is limited due to motion artifact. Alignment: No acute subluxation.  Grade 1 C4-C5 anterolisthesis. Skull base and vertebrae: No acute fracture.  Osteopenia. Soft tissues and spinal canal: No prevertebral fluid or swelling. No visible canal hematoma. Disc levels: Multilevel degenerative changes with endplate irregularity and disc space narrowing. There is multilevel facet arthropathy Upper chest: Mild emphysema. Other: Bilateral carotid bulb calcified plaques. IMPRESSION: 1. No definite acute/traumatic cervical spine pathology. 2. Multiple degenerative changes. Electronically Signed   By: Anner Crete M.D.   On: 01/18/2020 15:37   DG Hip Unilat With Pelvis 2-3 Views Right  Result Date: 01/18/2020 CLINICAL DATA:  Fall, right hip pain EXAM: DG HIP (WITH OR WITHOUT PELVIS) 2-3V RIGHT COMPARISON:  CT 10/25/2016 FINDINGS: Mildly displaced fractures of the right superior and inferior pubic rami, which are new from prior. Pubic symphysis is intact without diastasis. SI joints are intact. Right hip joint is intact without fracture or dislocation. Bones are demineralized. Vascular calcifications. IMPRESSION: Mildly displaced fractures of the right superior and inferior pubic rami, which are new from prior. Electronically Signed   By: Davina Poke D.O.   On:  01/18/2020 15:42    Procedures Procedures (including critical care time)  Medications Ordered in ED Medications  fentaNYL (SUBLIMAZE) injection 12.5 mcg (12.5 mcg Intravenous Given 01/18/20 1421)    ED Course  I have reviewed the triage vital signs and the nursing notes.  Pertinent labs & imaging results that were available during my care of the patient were reviewed by me and considered in my medical decision making (see chart for details).    MDM Rules/Calculators/A&P                      3:59 PM I discussed the patient's findings with orthopedic team.  I also discussed the findings with her.  She continues to complain of pain. Patient has nonsurgical fractures of the pelvis and the right scapula. Given concern for ongoing pain management, weakness inability to ambulate secondary to pain from the fall she will require admission. Patient's evaluation in terms of dyspnea is generally reassuring, Covid negative, x-ray without demonstration of pneumonia. Some suspicion for the patient's COPD contributing to this aspect of her presentation. Final Clinical Impression(s) / ED Diagnoses Final diagnoses:  Fall, initial encounter  Closed nondisplaced fracture of body of right scapula, initial encounter  Multiple closed fractures of pelvis without disruption of pelvic ring, initial encounter ([place])  Shortness of breath     Carmin Muskrat, MD 01/18/20 1647    Carmin Muskrat, MD 01/18/20 1650

## 2020-01-18 NOTE — ED Notes (Signed)
Attempted to call nursing report.  

## 2020-01-18 NOTE — Progress Notes (Signed)
Patient ID: Kendra Hernandez, female   DOB: October 19, 1941, 79 y.o.   MRN: [id number]  Contacted by EDP regarding right scap and pelvic fxs. All non-operative. She may be WBAT BLE and NWB in sling RUE; motion unrestricted. Full consult note to follow in am.    Lisette Abu, PA-C Orthopedic Surgery [phone]

## 2020-01-19 DIAGNOSIS — R413 Other amnesia: Secondary | ICD-10-CM | POA: Diagnosis not present

## 2020-01-19 DIAGNOSIS — Z515 Encounter for palliative care: Secondary | ICD-10-CM | POA: Diagnosis present

## 2020-01-19 DIAGNOSIS — S32591A Other specified fracture of right pubis, initial encounter for closed fracture: Secondary | ICD-10-CM | POA: Diagnosis present

## 2020-01-19 DIAGNOSIS — S42101A Fracture of unspecified part of scapula, right shoulder, initial encounter for closed fracture: Secondary | ICD-10-CM | POA: Diagnosis not present

## 2020-01-19 DIAGNOSIS — G4700 Insomnia, unspecified: Secondary | ICD-10-CM | POA: Diagnosis present

## 2020-01-19 DIAGNOSIS — W010XXA Fall on same level from slipping, tripping and stumbling without subsequent striking against object, initial encounter: Secondary | ICD-10-CM | POA: Diagnosis present

## 2020-01-19 DIAGNOSIS — K219 Gastro-esophageal reflux disease without esophagitis: Secondary | ICD-10-CM | POA: Diagnosis present

## 2020-01-19 DIAGNOSIS — I10 Essential (primary) hypertension: Secondary | ICD-10-CM | POA: Diagnosis present

## 2020-01-19 DIAGNOSIS — Z880 Allergy status to penicillin: Secondary | ICD-10-CM | POA: Diagnosis not present

## 2020-01-19 DIAGNOSIS — Z8673 Personal history of transient ischemic attack (TIA), and cerebral infarction without residual deficits: Secondary | ICD-10-CM | POA: Diagnosis not present

## 2020-01-19 DIAGNOSIS — M6281 Muscle weakness (generalized): Secondary | ICD-10-CM | POA: Diagnosis not present

## 2020-01-19 DIAGNOSIS — E039 Hypothyroidism, unspecified: Secondary | ICD-10-CM | POA: Diagnosis present

## 2020-01-19 DIAGNOSIS — S32501D Unspecified fracture of right pubis, subsequent encounter for fracture with routine healing: Secondary | ICD-10-CM | POA: Diagnosis not present

## 2020-01-19 DIAGNOSIS — S42111A Displaced fracture of body of scapula, right shoulder, initial encounter for closed fracture: Secondary | ICD-10-CM | POA: Diagnosis present

## 2020-01-19 DIAGNOSIS — R4182 Altered mental status, unspecified: Secondary | ICD-10-CM | POA: Diagnosis not present

## 2020-01-19 DIAGNOSIS — R0902 Hypoxemia: Secondary | ICD-10-CM | POA: Diagnosis present

## 2020-01-19 DIAGNOSIS — Z7401 Bed confinement status: Secondary | ICD-10-CM | POA: Diagnosis not present

## 2020-01-19 DIAGNOSIS — R2681 Unsteadiness on feet: Secondary | ICD-10-CM | POA: Diagnosis not present

## 2020-01-19 DIAGNOSIS — M2550 Pain in unspecified joint: Secondary | ICD-10-CM | POA: Diagnosis not present

## 2020-01-19 DIAGNOSIS — Z79891 Long term (current) use of opiate analgesic: Secondary | ICD-10-CM | POA: Diagnosis not present

## 2020-01-19 DIAGNOSIS — R296 Repeated falls: Secondary | ICD-10-CM | POA: Diagnosis present

## 2020-01-19 DIAGNOSIS — R52 Pain, unspecified: Secondary | ICD-10-CM | POA: Diagnosis not present

## 2020-01-19 DIAGNOSIS — F17210 Nicotine dependence, cigarettes, uncomplicated: Secondary | ICD-10-CM | POA: Diagnosis present

## 2020-01-19 DIAGNOSIS — Z85828 Personal history of other malignant neoplasm of skin: Secondary | ICD-10-CM | POA: Diagnosis not present

## 2020-01-19 DIAGNOSIS — J069 Acute upper respiratory infection, unspecified: Secondary | ICD-10-CM | POA: Diagnosis not present

## 2020-01-19 DIAGNOSIS — E785 Hyperlipidemia, unspecified: Secondary | ICD-10-CM | POA: Diagnosis present

## 2020-01-19 DIAGNOSIS — Z79899 Other long term (current) drug therapy: Secondary | ICD-10-CM | POA: Diagnosis not present

## 2020-01-19 DIAGNOSIS — Z9181 History of falling: Secondary | ICD-10-CM | POA: Diagnosis not present

## 2020-01-19 DIAGNOSIS — S42101D Fracture of unspecified part of scapula, right shoulder, subsequent encounter for fracture with routine healing: Secondary | ICD-10-CM | POA: Diagnosis not present

## 2020-01-19 DIAGNOSIS — W19XXXA Unspecified fall, initial encounter: Secondary | ICD-10-CM | POA: Diagnosis not present

## 2020-01-19 DIAGNOSIS — J9811 Atelectasis: Secondary | ICD-10-CM | POA: Diagnosis present

## 2020-01-19 DIAGNOSIS — R41841 Cognitive communication deficit: Secondary | ICD-10-CM | POA: Diagnosis not present

## 2020-01-19 DIAGNOSIS — Z20822 Contact with and (suspected) exposure to covid-19: Secondary | ICD-10-CM | POA: Diagnosis present

## 2020-01-19 DIAGNOSIS — H9190 Unspecified hearing loss, unspecified ear: Secondary | ICD-10-CM | POA: Diagnosis present

## 2020-01-19 DIAGNOSIS — Z8249 Family history of ischemic heart disease and other diseases of the circulatory system: Secondary | ICD-10-CM | POA: Diagnosis not present

## 2020-01-19 DIAGNOSIS — F0391 Unspecified dementia with behavioral disturbance: Secondary | ICD-10-CM | POA: Diagnosis present

## 2020-01-19 DIAGNOSIS — I469 Cardiac arrest, cause unspecified: Secondary | ICD-10-CM | POA: Diagnosis not present

## 2020-01-19 DIAGNOSIS — I69828 Other speech and language deficits following other cerebrovascular disease: Secondary | ICD-10-CM | POA: Diagnosis not present

## 2020-01-19 DIAGNOSIS — J449 Chronic obstructive pulmonary disease, unspecified: Secondary | ICD-10-CM | POA: Diagnosis present

## 2020-01-19 DIAGNOSIS — Z825 Family history of asthma and other chronic lower respiratory diseases: Secondary | ICD-10-CM | POA: Diagnosis not present

## 2020-01-19 LAB — BASIC METABOLIC PANEL
Anion gap: 9 (ref 5–15)
BUN: 12 mg/dL (ref 8–23)
CO2: 23 mmol/L (ref 22–32)
Calcium: 8.8 mg/dL — ABNORMAL LOW (ref 8.9–10.3)
Chloride: 104 mmol/L (ref 98–111)
Creatinine, Ser: 0.58 mg/dL (ref 0.44–1.00)
GFR calc Af Amer: >60 mL/min (ref >60)
GFR calc non Af Amer: >60 mL/min (ref >60)
Glucose, Bld: 99 mg/dL (ref 70–99)
Potassium: 3.7 mmol/L (ref 3.5–5.1)
Sodium: 136 mmol/L (ref 135–145)

## 2020-01-19 NOTE — Evaluation (Signed)
Physical Therapy Evaluation Patient Details Name: Kendra Hernandez MRN: [id number] DOB: 1941/06/14 Today's Date: 01/19/2020   History of Present Illness  Kendra Hernandez is a 79 y.o. female with medical history significant of hypertension, hyperlipidemia, COPD, and tobacco abuse.  She presents after having a witnessed fall with complaints of pain in her right hip, back, and right shoulder.Acute right upper and lower pubic ramus fractures and right scapular fracture secondary to fall. Orthopedics was consulted and no surgery recommended. Sling for comfort.    Clinical Impression  Pt presented supine in bed with HOB elevated, awake and willing to participate in therapy session. Prior to admission, pt reported that she ambulated with use of a rollator and was independent with ADLs. Pt lives at [place] [facility]. At the time of evaluation, pt significantly limited secondary to bilateral hip pain and weakness. She required heavy physical assistance of two for bed mobility and transfers. She will require post-acute rehab prior to returning to her ALF. Despite multiple reminders and cueing, pt using R UE to assist during mobility and performing AROM without issues. RN was notified.   Pt would continue to benefit from skilled physical therapy services at this time while admitted and after d/c to address the below listed limitations in order to improve overall safety and independence with functional mobility.     Follow Up Recommendations SNF    Equipment Recommendations  None recommended by PT    Recommendations for Other Services       Precautions / Restrictions Precautions Precautions: Fall Restrictions Weight Bearing Restrictions: Yes RUE Weight Bearing: Non weight bearing RLE Weight Bearing: Weight bearing as tolerated LLE Weight Bearing: Weight bearing as tolerated Other Position/Activity Restrictions: Per MD notes: She may be WBAT BLE and NWB in sling RUE; motion unrestricted. Sling to the  R UE for comfort. Gentle pendulum exercises.      Mobility  Bed Mobility Overal bed mobility: Needs Assistance Bed Mobility: Supine to Sit     Supine to sit: Mod assist;+2 for physical assistance     General bed mobility comments: assist with LEs to EOB and to elevate trunk  Transfers Overall transfer level: Needs assistance Equipment used: 2 person hand held assist Transfers: Sit to/from Stand;Stand Pivot Transfers Sit to Stand: Max assist;+2 physical assistance Stand pivot transfers: Max assist;+2 physical assistance       General transfer comment: increased time and effort needed, heavy assistance needed to power into standing from EOB and for pivot to chair towards pt's R side  Ambulation/Gait                Stairs            Wheelchair Mobility    Modified Rankin (Stroke Patients Only)       Balance Overall balance assessment: Needs assistance Sitting-balance support: Feet unsupported Sitting balance-Leahy Scale: Fair     Standing balance support: Bilateral upper extremity supported;During functional activity Standing balance-Leahy Scale: Poor                               Pertinent Vitals/Pain Pain Assessment: Faces Faces Pain Scale: Hurts even more Pain Location: hips Pain Descriptors / Indicators: Grimacing;Guarding;Moaning Pain Intervention(s): Monitored during session;Repositioned    Home Living Family/patient expects to be discharged to:: Assisted living               Home Equipment: Walker - 4 wheels      Prior  Function Level of Independence: Independent with assistive device(s)         Comments: ambulates with a rollator; independent with ADLs; needs assist for medication management     Hand Dominance   Dominant Hand: Right    Extremity/Trunk Assessment   Upper Extremity Assessment Upper Extremity Assessment: Defer to OT evaluation    Lower Extremity Assessment Lower Extremity Assessment:  Generalized weakness       Communication   Communication: HOH  Cognition Arousal/Alertness: Awake/alert Behavior During Therapy: WFL for tasks assessed/performed Overall Cognitive Status: Within Functional Limits for tasks assessed                                        General Comments      Exercises General Exercises - Lower Extremity Ankle Circles/Pumps: AROM;Both;20 reps;Seated Long Arc Quad: AROM;Strengthening;Left;10 reps;Seated;Other (comment)(could not tolerate on R)   Assessment/Plan    PT Assessment Patient needs continued PT services  PT Problem List Decreased strength;Decreased range of motion;Decreased activity tolerance;Decreased balance;Decreased mobility;Decreased coordination;Decreased knowledge of use of DME;Decreased safety awareness;Decreased knowledge of precautions;Pain       PT Treatment Interventions DME instruction;Gait training;Stair training;Functional mobility training;Therapeutic activities;Therapeutic exercise;Balance training;Neuromuscular re-education;Patient/family education    PT Goals (Current goals can be found in the Care Plan section)  Acute Rehab PT Goals Patient Stated Goal: decrease pain PT Goal Formulation: With patient Time For Goal Achievement: 02/02/20 Potential to Achieve Goals: Good    Frequency Min 2X/week   Barriers to discharge        Co-evaluation PT/OT/SLP Co-Evaluation/Treatment: Yes Reason for Co-Treatment: For patient/therapist safety;To address functional/ADL transfers PT goals addressed during session: Mobility/safety with mobility;Balance;Strengthening/ROM OT goals addressed during session: ADL's and self-care;Proper use of Adaptive equipment and DME       AM-PAC PT "6 Clicks" Mobility  Outcome Measure Help needed turning from your back to your side while in a flat bed without using bedrails?: A Lot Help needed moving from lying on your back to sitting on the side of a flat bed without using  bedrails?: A Lot Help needed moving to and from a bed to a chair (including a wheelchair)?: A Lot Help needed standing up from a chair using your arms (e.g., wheelchair or bedside chair)?: A Lot Help needed to walk in hospital room?: Total Help needed climbing 3-5 steps with a railing? : Total 6 Click Score: 10    End of Session Equipment Utilized During Treatment: Gait belt Activity Tolerance: Patient limited by pain Patient left: in chair;with call bell/phone within reach;with chair alarm set Nurse Communication: Mobility status;Weight bearing status PT Visit Diagnosis: Other abnormalities of gait and mobility (R26.89);Pain Pain - Right/Left: Right Pain - part of body: Hip    Time: [id number] PT Time Calculation (min) (ACUTE ONLY): 30 min   Charges:   PT Evaluation $PT Eval Moderate Complexity: 1 Mod          Eduard Clos, PT, DPT  Acute Rehabilitation Services Pager [phone] Office [place] 01/19/2020, 2:07 PM

## 2020-01-19 NOTE — Progress Notes (Signed)
Orthopedic Tech Progress Note Patient Details:  Kendra Hernandez [birth year] [id number]  Ortho Devices Type of Ortho Device: Sling immobilizer Ortho Device/Splint Location: rue Ortho Device/Splint Interventions: Ordered, Application, Adjustment   Post Interventions Patient Tolerated: Well Instructions Provided: Care of device, Adjustment of device   Karolee Stamps 01/19/2020, 7:26 AM

## 2020-01-19 NOTE — Progress Notes (Signed)
Subjective:     Patient reports pain as mild to moderate.  Resting comfortably in bed.  Patient is hard of hearing.  Objective:   VITALS:  Temp:  [97.8 F (36.6 C)-99.2 F (37.3 C)] 97.8 F (36.6 C) (01/20 0455) Pulse Rate:  [66-86] 68 (01/20 0455) Resp:  [14-26] 18 (01/20 0455) BP: (87-142)/(48-96) 141/65 (01/20 0455) SpO2:  [93 %-95 %] 95 % (01/20 0455) Weight:  [56.7 kg] 56.7 kg (01/19 1323)  General: WDWN patient in NAD. Psych:  Appropriate mood and affect. Neuro:  A&O x 3, Moving all extremities, sensation intact to light touch HEENT:  EOMs intact Chest:  Even non-labored respirations Skin: C/D/I, no rashes or lesions, R shoulder immobilized in sling Extremities: warm/dry, mild edema to R UE, no erythema or echymosis.  No lymphadenopathy. Pulses: Popliteus 2+, radial 2+ MSK:  ROM: full R wrist compared bilaterally, TKE bilaterally, MMT: able to perform quad set, (-) Homan's    LABS Recent Labs    01/18/20 1413  HGB 14.1  WBC 7.0  PLT 157   Recent Labs    01/18/20 1413 01/19/20 0215  NA 139 136  K 4.0 3.7  CL 107 104  CO2 24 23  BUN 12 12  CREATININE 0.66 0.58  GLUCOSE 103* 99   No results for input(s): LABPT, INR in the last 72 hours.   Assessment/Plan:    R nondisplaced scapula fx Nondisplaced R superior and inferior pubic ramus fx  These are stable injuries being treated in closed fashion. Sling to R UE. Gentle pendulum exercises to R UE WBAT bilateral LE Up with therapy Plan for outpatient f/u with Dr. Doran Durand in 2-3 weeks.   Mechele Claude PA-C [facility] Office:  [phone]

## 2020-01-19 NOTE — Plan of Care (Signed)
  Problem: Activity: °Goal: Ability to avoid complications of mobility impairment will improve °Outcome: Progressing °  °Problem: Respiratory: °Goal: Ability to maintain a clear airway will improve °Outcome: Progressing °  °Problem: Pain Management: °Goal: Pain level will decrease °Outcome: Progressing °  °Problem: Skin Integrity: °Goal: Signs of wound healing will improve °Outcome: Progressing °  °

## 2020-01-19 NOTE — Progress Notes (Signed)
PROGRESS NOTE    Corita Gerhart  [id number] DOB: [birth year] DOA: 01/18/2020 PCP: Seward Carol, MD   Brief Narrative:  HPI: Kendra Hernandez is a 79 y.o. female with medical history significant of hypertension, hyperlipidemia, COPD, and tobacco abuse.  She presents after having a witnessed fall with complaints of pain in her right hip, back, and right shoulder.  At baseline she ambulates with the use of prior to walker and had been using it when she was coming from lunch.  She reports that her feet got tangled up causing her to trip and fall.  She was unable to bear weight on her feet thereafter.  Denies any trauma to her head or lose consciousness.  Patient reports associated symptoms of shortness of breath, but she reports that this is chronic.  Normally she does not require oxygen at home and admits to smoking half pack cigarettes per day on average.  ED Course: On admission into the emergency department patient was seen to be afebrile with O2 saturations 89-91% on room air, and vital signs relatively within normal limits.  Labs were unremarkable.  X-ray imaging revealed right scapula, right superior pubic ramus, and right inferior pelvic ramus fractures.  Orthopedics was consulted.  COVID-19 and influenza screening were negative.  Patient had received 25 mcg of fentanyl.  TRH called to admit.  Assessment & Plan:   Principal Problem:   Multiple closed stable fractures of pubic ramus (HCC) Active Problems:   Essential hypertension, benign   Esophageal reflux   Memory loss   Fall   Essential hypertension   Dementia with behavioral disturbance (HCC)   Hypoxia   Closed right scapular fracture   COPD (chronic obstructive pulmonary disease) (HCC)   Acute right upper and lower pubic ramus fractures and right scapular fracture secondary to fall: Seen by orthopedics.  Right upper extremity sling was placed.  Orthopedics recommend supportive management, PT and weightbearing as tolerated.   Appreciate their help.  PT to see patient.  Continue current pain management in the meantime.    Hypoxia: On admission O2 saturations noted to be 89 to 91% on room air.  Chest x-ray showed a right scapular fracture.    This was likely secondary to pain and atelectasis.  Has resolved.  She is saturating >90% on room air now.  Continue incentive spirometry.  Essential hypertension: Blood pressure better and fairly controlled.  Continue amlodipine and labetalol.    COPD, without acute exacerbation: Patient with history of tobacco use. -Continue as needed albuterol.  Hyperlipidemia: Continue atorvastatin 20 mg p.o. daily.  Dementia -Continue Aricept  Tobacco abuse: Smoking cessation counseling provided.  Insomnia -Continue trazodone and Seroquel  GERD -Continue pharmacy substitution for omeprazole  DVT prophylaxis: Lovenox Code Status: Full code Family Communication:  None present at bedside.  Plan of care discussed with patient in length and he verbalized understanding and agreed with it. Disposition Plan: To be determined.  Will likely need SNF.  Estimated body mass index is 20.8 kg/m as calculated from the following:   Height as of this encounter: 5\' 5"  (1.651 m).   Weight as of this encounter: 56.7 kg.      Nutritional status:               Consultants:   Orthopedics  Procedures:   None  Antimicrobials:   None   Subjective: Seen and examined.  Complains of right arm pain, upper back and lower back pain.  No other complaint.  Objective: Vitals:  01/18/20 2035 01/18/20 2101 01/19/20 0455 01/19/20 0758  BP: (!) 142/78 137/64 (!) 141/65 (!) 119/55  Pulse: 78 66 68 64  Resp: 16 14 18 17   Temp:  99.2 F (37.3 C) 97.8 F (36.6 C) 98.5 F (36.9 C)  TempSrc:  Oral Oral Oral  SpO2: 95% 95% 95% 90%  Weight:      Height:        Intake/Output Summary (Last 24 hours) at 01/19/2020 1222 Last data filed at 01/19/2020 0500 Gross per 24 hour    Intake 360 ml  Output 200 ml  Net 160 ml   Filed Weights   01/18/20 1323  Weight: 56.7 kg    Examination:  General exam: Appears calm and comfortable  Respiratory system: Slightly diminished breath sounds at the bases. Respiratory effort normal. Cardiovascular system: S1 & S2 heard, RRR. No JVD, murmurs, rubs, gallops or clicks. No pedal edema. Gastrointestinal system: Abdomen is nondistended, soft and nontender. No organomegaly or masses felt. Normal bowel sounds heard. Central nervous system: Alert and oriented. No focal neurological deficits. Extremities: Sling in right upper extremity. Skin: No rashes, lesions or ulcers Psychiatry: Judgement and insight appear normal. Mood & affect appropriate.    Data Reviewed: I have personally reviewed following labs and imaging studies  CBC: Recent Labs  Lab 01/18/20 1413  WBC 7.0  NEUTROABS 4.7  HGB 14.1  HCT 43.1  MCV 90.7  PLT [id number]   Basic Metabolic Panel: Recent Labs  Lab 01/18/20 1413 01/19/20 0215  NA 139 136  K 4.0 3.7  CL 107 104  CO2 24 23  GLUCOSE 103* 99  BUN 12 12  CREATININE 0.66 0.58  CALCIUM 9.3 8.8*   GFR: Estimated Creatinine Clearance: 53.6 mL/min (by C-G formula based on SCr of 0.58 mg/dL). Liver Function Tests: Recent Labs  Lab 01/18/20 1413  AST 19  ALT 16  ALKPHOS 100  BILITOT 0.7  PROT 6.6  ALBUMIN 3.9   No results for input(s): LIPASE, AMYLASE in the last 168 hours. No results for input(s): AMMONIA in the last 168 hours. Coagulation Profile: No results for input(s): INR, PROTIME in the last 168 hours. Cardiac Enzymes: No results for input(s): CKTOTAL, CKMB, CKMBINDEX, TROPONINI in the last 168 hours. BNP (last 3 results) No results for input(s): PROBNP in the last 8760 hours. HbA1C: No results for input(s): HGBA1C in the last 72 hours. CBG: No results for input(s): GLUCAP in the last 168 hours. Lipid Profile: No results for input(s): CHOL, HDL, LDLCALC, TRIG, CHOLHDL, LDLDIRECT  in the last 72 hours. Thyroid Function Tests: Recent Labs    01/18/20 2224  TSH 1.047   Anemia Panel: No results for input(s): VITAMINB12, FOLATE, FERRITIN, TIBC, IRON, RETICCTPCT in the last 72 hours. Sepsis Labs: No results for input(s): PROCALCITON, LATICACIDVEN in the last 168 hours.  Recent Results (from the past 240 hour(s))  Respiratory Panel by RT PCR (Flu A&B, Covid) - Nasopharyngeal Swab     Status: None   Collection Time: 01/18/20  2:35 PM   Specimen: Nasopharyngeal Swab  Result Value Ref Range Status   SARS Coronavirus 2 by RT PCR NEGATIVE NEGATIVE Final    Comment: (NOTE) SARS-CoV-2 target nucleic acids are NOT DETECTED. The SARS-CoV-2 RNA is generally detectable in upper respiratoy specimens during the acute phase of infection. The lowest concentration of SARS-CoV-2 viral copies this assay can detect is 131 copies/mL. A negative result does not preclude SARS-Cov-2 infection and should not be used as the sole basis for  treatment or other patient management decisions. A negative result may occur with  improper specimen collection/handling, submission of specimen other than nasopharyngeal swab, presence of viral mutation(s) within the areas targeted by this assay, and inadequate number of viral copies (<131 copies/mL). A negative result must be combined with clinical observations, patient history, and epidemiological information. The expected result is Negative. Fact Sheet for Patients:  PinkCheek.be Fact Sheet for Healthcare Providers:  GravelBags.it This test is not yet ap proved or cleared by the [place] FDA and  has been authorized for detection and/or diagnosis of SARS-CoV-2 by FDA under an Emergency Use Authorization (EUA). This EUA will remain  in effect (meaning this test can be used) for the duration of the COVID-19 declaration under Section 564(b)(1) of the Act, 21 U.S.C. section  360bbb-3(b)(1), unless the authorization is terminated or revoked sooner.    Influenza A by PCR NEGATIVE NEGATIVE Final   Influenza B by PCR NEGATIVE NEGATIVE Final    Comment: (NOTE) The Xpert Xpress SARS-CoV-2/FLU/RSV assay is intended as an aid in  the diagnosis of influenza from Nasopharyngeal swab specimens and  should not be used as a sole basis for treatment. Nasal washings and  aspirates are unacceptable for Xpert Xpress SARS-CoV-2/FLU/RSV  testing. Fact Sheet for Patients: PinkCheek.be Fact Sheet for Healthcare Providers: GravelBags.it This test is not yet approved or cleared by the [place] FDA and  has been authorized for detection and/or diagnosis of SARS-CoV-2 by  FDA under an Emergency Use Authorization (EUA). This EUA will remain  in effect (meaning this test can be used) for the duration of the  Covid-19 declaration under Section 564(b)(1) of the Act, 21  U.S.C. section 360bbb-3(b)(1), unless the authorization is  terminated or revoked. Performed at [facility], [place] [address] [place], [place] [address]       Radiology Studies: DG Chest 1 View  Result Date: 01/18/2020 CLINICAL DATA:  Fall, back pain, shortness of breath EXAM: CHEST  1 VIEW COMPARISON:  06/18/2017 FINDINGS: The heart size and mediastinal contours are within normal limits. Opacity with bubbly internal contents at the medial right lung base compatible with known moderate-sized hiatal hernia. Both lungs are otherwise clear. No pleural effusion or pneumothorax. Abnormal contour of the inferior aspect of the right glenoid neck concerning for fracture. IMPRESSION: 1. No active cardiopulmonary disease. 2. Abnormal cortical contour of the right scapula at the inferior glenoid neck concerning for fracture. 3. Hiatal hernia. Electronically Signed   By: Davina Poke D.O.   On: 01/18/2020 15:47   DG Shoulder Right  Result Date:  01/18/2020 CLINICAL DATA:  Shoulder pain after fall EXAM: RIGHT SHOULDER - 2+ VIEW COMPARISON:  07/16/2016 FINDINGS: Cortical irregularity with subtle angulation of the right scapula at the inferior glenoid neck, which was slightly more conspicuous on the frontal chest radiograph which was obtained concurrently. Glenohumeral joint is intact without fracture or dislocation. AC joint is intact. Soft tissues appear within normal limits. IMPRESSION: Cortical irregularity with subtle angulation of the right scapula at the inferior glenoid neck, which raises suspicion for a nondisplaced fracture. If further evaluation of this area is clinically warranted, CT can be performed. Electronically Signed   By: Davina Poke D.O.   On: 01/18/2020 15:51   CT Cervical Spine Wo Contrast  Result Date: 01/18/2020 CLINICAL DATA:  79 year old female with trauma. EXAM: CT CERVICAL SPINE WITHOUT CONTRAST TECHNIQUE: Multidetector CT imaging of the cervical spine was performed without intravenous contrast. Multiplanar CT image reconstructions were  also generated. COMPARISON:  CT of the cervical spine dated 08/08/2018. FINDINGS: Evaluation of this exam is limited due to motion artifact. Alignment: No acute subluxation.  Grade 1 C4-C5 anterolisthesis. Skull base and vertebrae: No acute fracture.  Osteopenia. Soft tissues and spinal canal: No prevertebral fluid or swelling. No visible canal hematoma. Disc levels: Multilevel degenerative changes with endplate irregularity and disc space narrowing. There is multilevel facet arthropathy Upper chest: Mild emphysema. Other: Bilateral carotid bulb calcified plaques. IMPRESSION: 1. No definite acute/traumatic cervical spine pathology. 2. Multiple degenerative changes. Electronically Signed   By: Anner Crete M.D.   On: 01/18/2020 15:37   DG Hip Unilat With Pelvis 2-3 Views Right  Result Date: 01/18/2020 CLINICAL DATA:  Fall, right hip pain EXAM: DG HIP (WITH OR WITHOUT PELVIS) 2-3V  RIGHT COMPARISON:  CT 10/25/2016 FINDINGS: Mildly displaced fractures of the right superior and inferior pubic rami, which are new from prior. Pubic symphysis is intact without diastasis. SI joints are intact. Right hip joint is intact without fracture or dislocation. Bones are demineralized. Vascular calcifications. IMPRESSION: Mildly displaced fractures of the right superior and inferior pubic rami, which are new from prior. Electronically Signed   By: Davina Poke D.O.   On: 01/18/2020 15:42    Scheduled Meds: . atorvastatin  20 mg Oral Daily  . darifenacin  7.5 mg Oral Daily  . donepezil  10 mg Oral QPC supper  . enoxaparin (LOVENOX) injection  40 mg Subcutaneous Q24H  . labetalol  100 mg Oral BID  . loratadine  10 mg Oral Daily  . nicotine  21 mg Transdermal Daily  . pantoprazole  40 mg Oral Daily  . QUEtiapine  25 mg Oral Daily  . sodium chloride flush  3 mL Intravenous Q12H  . traZODone  200 mg Oral QHS  . venlafaxine XR  225 mg Oral Q breakfast   Continuous Infusions:   LOS: 0 days   Time spent: 32 minutes   Darliss Cheney, MD [facility]  01/19/2020, 12:22 PM   To contact the attending provider between 7A-7P or the covering provider during after hours 7P-7A, please log into the web site [email or web address].

## 2020-01-19 NOTE — Progress Notes (Signed)
Call placed to ortho tech- for rt arm sling- message left

## 2020-01-19 NOTE — Evaluation (Signed)
Occupational Therapy Evaluation Patient Details Name: Kendra Hernandez MRN: [id number] DOB: 1941/08/01 Today's Date: 01/19/2020    History of Present Illness Kendra Hernandez is a 79 y.o. female with medical history significant of hypertension, hyperlipidemia, COPD, and tobacco abuse.  She presents after having a witnessed fall with complaints of pain in her right hip, back, and right shoulder.Acute right upper and lower pubic ramus fractures and right scapular fracture secondary to fall: Seen by orthopedics.  Right upper extremity sling was placed.   Clinical Impression   Pt with decline in function and safety with ADLs and ADL mobility with impaired strength, balance, endurance, R UE function and is limited by pain in pubic rami area. PTA, pt lived at [facility] and was independent with ADLs/selfcare and used a RW for mobility. Pt with hx of falls. Pt currently requires mod A +2 for bed mobility, min A with UB ADLs, max - total A with LB ADLs, total A with toileting and max A +2 for sit - stand/SPT. For R UE, pt with sling for comfort, no ROM retsrictions, but NWB and can do pendulums per MD note. Pt using R UE for ADL tasks with no c/o pain. Initiated R pendulum exercises education with pt and handout provided. Pt would benefit from acute OT services to address impairments to maximize level of function and safety with    Follow Up Recommendations  SNF    Equipment Recommendations  (TBD atnext venue of care)    Recommendations for Other Services       Precautions / Restrictions Precautions Precautions: Fall Restrictions Weight Bearing Restrictions: Yes RUE Weight Bearing: Non weight bearing RLE Weight Bearing: Weight bearing as tolerated LLE Weight Bearing: Weight bearing as tolerated Other Position/Activity Restrictions: Per MD notes: She may be WBAT BLE and NWB in sling RUE; motion unrestricted. Sling to the R UE for comfort. Gentle pendulum exercises.      Mobility Bed  Mobility Overal bed mobility: Needs Assistance Bed Mobility: Supine to Sit     Supine to sit: Mod assist;+2 for physical assistance     General bed mobility comments: assist with LEs to EOB and to elevate trunk  Transfers Overall transfer level: Needs assistance   Transfers: Sit to/from Stand;Stand Pivot Transfers Sit to Stand: Max assist;+2 physical assistance Stand pivot transfers: Max assist;+2 physical assistance       General transfer comment: pt limited by pain    Balance Overall balance assessment: Needs assistance Sitting-balance support: Feet unsupported Sitting balance-Leahy Scale: Fair     Standing balance support: Bilateral upper extremity supported;During functional activity Standing balance-Leahy Scale: Poor                             ADL either performed or assessed with clinical judgement   ADL Overall ADL's : Needs assistance/impaired Eating/Feeding: Set up;Sitting Eating/Feeding Details (indicate cue type and reason): assist with opening containers Grooming: Wash/dry hands;Wash/dry face;Min guard;Sitting   Upper Body Bathing: Minimal assistance   Lower Body Bathing: Maximal assistance   Upper Body Dressing : Minimal assistance   Lower Body Dressing: Total assistance   Toilet Transfer: Maximal assistance;Moderate assistance;+2 for physical assistance;Stand-pivot   Toileting- Clothing Manipulation and Hygiene: Total assistance       Functional mobility during ADLs: Maximal assistance;Moderate assistance;+2 for physical assistance;Cueing for safety;Cueing for sequencing General ADL Comments: pt with R UE sling for comfort, no ROM retsrictions, but NWB and can do pendulums per MD  note. Pt using R UE for ADL tasks with no c/o pain     Vision Patient Visual Report: No change from baseline       Perception     Praxis      Pertinent Vitals/Pain Pain Assessment: Faces Pain Score: 7  Faces Pain Scale: Hurts even more Pain  Location: with mobility, R LE Pain Descriptors / Indicators: Grimacing;Guarding;Moaning Pain Intervention(s): Limited activity within patient's tolerance;Monitored during session;Repositioned     Hand Dominance Right   Extremity/Trunk Assessment Upper Extremity Assessment Upper Extremity Assessment: Generalized weakness   Lower Extremity Assessment Lower Extremity Assessment: Defer to PT evaluation       Communication Communication Communication: HOH   Cognition Arousal/Alertness: Awake/alert Behavior During Therapy: WFL for tasks assessed/performed Overall Cognitive Status: Within Functional Limits for tasks assessed                                     General Comments       Exercises     Shoulder Instructions      Home Living Family/patient expects to be discharged to:: Assisted living                             Home Equipment: Walker - 4 wheels          Prior Functioning/Environment Level of Independence: Independent with assistive device(s)        Comments: ambulates with a rollator; independent with ADLs; needs assist for medication management        OT Problem List: Decreased strength;Impaired balance (sitting and/or standing);Decreased knowledge of precautions;Pain;Decreased activity tolerance;Decreased coordination;Decreased knowledge of use of DME or AE;Impaired UE functional use      OT Treatment/Interventions: Self-care/ADL training;DME and/or AE instruction;Therapeutic activities;Therapeutic exercise;Patient/family education;Balance training    OT Goals(Current goals can be found in the care plan section) Acute Rehab OT Goals Patient Stated Goal: none stated OT Goal Formulation: With patient Time For Goal Achievement: 02/02/20 Potential to Achieve Goals: Good ADL Goals Pt Will Perform Grooming: with supervision;with set-up;sitting Pt Will Perform Upper Body Bathing: with min guard assist;sitting Pt Will Perform  Lower Body Bathing: with mod assist;sitting/lateral leans Pt Will Perform Upper Body Dressing: with min guard assist;sitting Pt Will Transfer to Toilet: with mod assist;stand pivot transfer Additional ADL Goal #1: Pt will tolerate R UE pendulum exercises per handout provided  OT Frequency: Min 2X/week   Barriers to D/C: Decreased caregiver support          Co-evaluation PT/OT/SLP Co-Evaluation/Treatment: Yes Reason for Co-Treatment: For patient/therapist safety;To address functional/ADL transfers   OT goals addressed during session: ADL's and self-care;Proper use of Adaptive equipment and DME      AM-PAC OT "6 Clicks" Daily Activity     Outcome Measure Help from another person eating meals?: A Little Help from another person taking care of personal grooming?: A Little Help from another person toileting, which includes using toliet, bedpan, or urinal?: A Little Help from another person bathing (including washing, rinsing, drying)?: A Lot Help from another person to put on and taking off regular upper body clothing?: A Little Help from another person to put on and taking off regular lower body clothing?: Total 6 Click Score: 15   End of Session Equipment Utilized During Treatment: Gait belt  Activity Tolerance: Patient limited by pain Patient left: in chair;with call bell/phone within reach;with  chair alarm set  OT Visit Diagnosis: Unsteadiness on feet (R26.81);Other abnormalities of gait and mobility (R26.89);Muscle weakness (generalized) (M62.81);History of falling (Z91.81);Pain Pain - Right/Left: Right Pain - part of body: Leg(pubic rami area)                Time: [id number] OT Time Calculation (min): 23 min Charges:  OT General Charges $OT Visit: 1 Visit OT Evaluation $OT Eval Moderate Complexity: 1 Mod    Britt Bottom 01/19/2020, 1:07 PM

## 2020-01-20 LAB — CBC
HCT: 38.5 % (ref 36.0–46.0)
Hemoglobin: 12.6 g/dL (ref 12.0–15.0)
MCH: 29.4 pg (ref 26.0–34.0)
MCHC: 32.7 g/dL (ref 30.0–36.0)
MCV: 90 fL (ref 80.0–100.0)
Platelets: 132 10*3/uL — ABNORMAL LOW (ref 150–400)
RBC: 4.28 MIL/uL (ref 3.87–5.11)
RDW: 13.4 % (ref 11.5–15.5)
WBC: 5.5 10*3/uL (ref 4.0–10.5)
nRBC: 0 % (ref 0.0–0.2)

## 2020-01-20 LAB — BASIC METABOLIC PANEL
Anion gap: 8 (ref 5–15)
BUN: 19 mg/dL (ref 8–23)
CO2: 23 mmol/L (ref 22–32)
Calcium: 8.8 mg/dL — ABNORMAL LOW (ref 8.9–10.3)
Chloride: 105 mmol/L (ref 98–111)
Creatinine, Ser: 0.66 mg/dL (ref 0.44–1.00)
GFR calc Af Amer: >60 mL/min (ref >60)
GFR calc non Af Amer: >60 mL/min (ref >60)
Glucose, Bld: 115 mg/dL — ABNORMAL HIGH (ref 70–99)
Potassium: 3.5 mmol/L (ref 3.5–5.1)
Sodium: 136 mmol/L (ref 135–145)

## 2020-01-20 NOTE — Plan of Care (Signed)
  Problem: Skin Integrity: Goal: Risk for impaired skin integrity will decrease Outcome: Progressing   Problem: Safety: Goal: Ability to remain free from injury will improve Outcome: Progressing   Problem: Pain Managment: Goal: General experience of comfort will improve Outcome: Progressing   Problem: Elimination: Goal: Will not experience complications related to bowel motility Outcome: Progressing   Problem: Nutrition: Goal: Adequate nutrition will be maintained Outcome: Progressing   Problem: Clinical Measurements: Goal: Ability to maintain clinical measurements within normal limits will improve Outcome: Progressing

## 2020-01-20 NOTE — Progress Notes (Signed)
PROGRESS NOTE    Kendra Hernandez  [id number] DOB: [birth year] DOA: 01/18/2020 PCP: Seward Carol, MD   Brief Narrative:  HPI: Kendra Hernandez is a 79 y.o. female with medical history significant of hypertension, hyperlipidemia, COPD, and tobacco abuse.  She presents after having a witnessed fall with complaints of pain in her right hip, back, and right shoulder.  At baseline she ambulates with the use of prior to walker and had been using it when she was coming from lunch.  She reports that her feet got tangled up causing her to trip and fall.  She was unable to bear weight on her feet thereafter.  Denies any trauma to her head or lose consciousness.  Patient reports associated symptoms of shortness of breath, but she reports that this is chronic.  Normally she does not require oxygen at home and admits to smoking half pack cigarettes per day on average.  ED Course: On admission into the emergency department patient was seen to be afebrile with O2 saturations 89-91% on room air, and vital signs relatively within normal limits.  Labs were unremarkable.  X-ray imaging revealed right scapula, right superior pubic ramus, and right inferior pelvic ramus fractures.  Orthopedics was consulted.  COVID-19 and influenza screening were negative.  Patient had received 25 mcg of fentanyl.  TRH called to admit.  Assessment & Plan:   Principal Problem:   Multiple closed stable fractures of pubic ramus (HCC) Active Problems:   Essential hypertension, benign   Esophageal reflux   Memory loss   Fall   Essential hypertension   Dementia with behavioral disturbance (HCC)   Hypoxia   Closed right scapular fracture   COPD (chronic obstructive pulmonary disease) (HCC)   Acute right upper and lower pubic ramus fractures and right scapular fracture secondary to fall: Seen by orthopedics.  Right upper extremity sling was placed.  Orthopedics recommend supportive management, PT and weightbearing as tolerated.   Appreciate their help.  Seen by PT.  Recommend SNF.  TOC consulted for finding placement.  Hypoxia: On admission O2 saturations noted to be 89 to 91% on room air.  Chest x-ray showed a right scapular fracture. This was likely secondary to pain and atelectasis.  Patient is saturating 96% on room air.  Incentive spirometry was ordered yesterday.  Patient is not using it.  Not available at the bedside.  Reordered today.  Advised RN to make sure patient gets incentive spirometry, education and prompts to use every hour.  Essential hypertension: Blood pressure better and fairly controlled.  Continue amlodipine and labetalol.   COPD, without acute exacerbation: Patient with history of tobacco use. -Continue as needed albuterol.  Hyperlipidemia: Continue atorvastatin 20 mg p.o. daily.  Dementia -Continue Aricept  Tobacco abuse: Smoking cessation counseling provided.  Insomnia -Continue trazodone and Seroquel  GERD -Continue pharmacy substitution for omeprazole  DVT prophylaxis: Lovenox Code Status: Full code Family Communication:  None present at bedside.  Called and updated patient's son Romie Minus. Disposition Plan: Will be discharged to SNF once bed available.  TOC consulted.  Estimated body mass index is 20.8 kg/m as calculated from the following:   Height as of this encounter: 5\' 5"  (1.651 m).   Weight as of this encounter: 56.7 kg.      Nutritional status:               Consultants:   Orthopedics  Procedures:   None  Antimicrobials:   None   Subjective: Seen and examined.  Pain is better  controlled.  No new complaint.  Objective: Vitals:   01/19/20 1421 01/19/20 2027 01/20/20 0402 01/20/20 0746  BP: 130/68 130/68 136/66 132/61  Pulse: 78 84 73 68  Resp: 17 16 16 17   Temp: 98.6 F (37 C) 98.9 F (37.2 C) (!) 97.5 F (36.4 C) 97.7 F (36.5 C)  TempSrc: Oral Oral Oral Oral  SpO2: 91% 94% 92% 96%  Weight:      Height:        Intake/Output  Summary (Last 24 hours) at 01/20/2020 1257 Last data filed at 01/20/2020 1100 Gross per 24 hour  Intake --  Output 1100 ml  Net -1100 ml   Filed Weights   01/18/20 1323  Weight: 56.7 kg    Examination:  General exam: Appears calm and comfortable  Respiratory system: Clear to auscultation. Respiratory effort normal. Cardiovascular system: S1 & S2 heard, RRR. No JVD, murmurs, rubs, gallops or clicks. No pedal edema. Gastrointestinal system: Abdomen is nondistended, soft and nontender. No organomegaly or masses felt. Normal bowel sounds heard. Central nervous system: Alert and oriented. No focal neurological deficits. Extremities: Sling in right upper extremity. Skin: No rashes, lesions or ulcers.  Psychiatry: Judgement and insight appear poor. Mood & affect flat.    Data Reviewed: I have personally reviewed following labs and imaging studies  CBC: Recent Labs  Lab 01/18/20 1413 01/20/20 0236  WBC 7.0 5.5  NEUTROABS 4.7  --   HGB 14.1 12.6  HCT 43.1 38.5  MCV 90.7 90.0  PLT 157 [id number]*   Basic Metabolic Panel: Recent Labs  Lab 01/18/20 1413 01/19/20 0215 01/20/20 0236  NA 139 136 136  K 4.0 3.7 3.5  CL 107 104 105  CO2 24 23 23   GLUCOSE 103* 99 115*  BUN 12 12 19   CREATININE 0.66 0.58 0.66  CALCIUM 9.3 8.8* 8.8*   GFR: Estimated Creatinine Clearance: 53.6 mL/min (by C-G formula based on SCr of 0.66 mg/dL). Liver Function Tests: Recent Labs  Lab 01/18/20 1413  AST 19  ALT 16  ALKPHOS 100  BILITOT 0.7  PROT 6.6  ALBUMIN 3.9   No results for input(s): LIPASE, AMYLASE in the last 168 hours. No results for input(s): AMMONIA in the last 168 hours. Coagulation Profile: No results for input(s): INR, PROTIME in the last 168 hours. Cardiac Enzymes: No results for input(s): CKTOTAL, CKMB, CKMBINDEX, TROPONINI in the last 168 hours. BNP (last 3 results) No results for input(s): PROBNP in the last 8760 hours. HbA1C: No results for input(s): HGBA1C in the last 72  hours. CBG: No results for input(s): GLUCAP in the last 168 hours. Lipid Profile: No results for input(s): CHOL, HDL, LDLCALC, TRIG, CHOLHDL, LDLDIRECT in the last 72 hours. Thyroid Function Tests: Recent Labs    01/18/20 2224  TSH 1.047   Anemia Panel: No results for input(s): VITAMINB12, FOLATE, FERRITIN, TIBC, IRON, RETICCTPCT in the last 72 hours. Sepsis Labs: No results for input(s): PROCALCITON, LATICACIDVEN in the last 168 hours.  Recent Results (from the past 240 hour(s))  Respiratory Panel by RT PCR (Flu A&B, Covid) - Nasopharyngeal Swab     Status: None   Collection Time: 01/18/20  2:35 PM   Specimen: Nasopharyngeal Swab  Result Value Ref Range Status   SARS Coronavirus 2 by RT PCR NEGATIVE NEGATIVE Final    Comment: (NOTE) SARS-CoV-2 target nucleic acids are NOT DETECTED. The SARS-CoV-2 RNA is generally detectable in upper respiratoy specimens during the acute phase of infection. The lowest concentration of SARS-CoV-2 viral  copies this assay can detect is 131 copies/mL. A negative result does not preclude SARS-Cov-2 infection and should not be used as the sole basis for treatment or other patient management decisions. A negative result may occur with  improper specimen collection/handling, submission of specimen other than nasopharyngeal swab, presence of viral mutation(s) within the areas targeted by this assay, and inadequate number of viral copies (<131 copies/mL). A negative result must be combined with clinical observations, patient history, and epidemiological information. The expected result is Negative. Fact Sheet for Patients:  PinkCheek.be Fact Sheet for Healthcare Providers:  GravelBags.it This test is not yet ap proved or cleared by the [place] FDA and  has been authorized for detection and/or diagnosis of SARS-CoV-2 by FDA under an Emergency Use Authorization (EUA). This EUA will remain    in effect (meaning this test can be used) for the duration of the COVID-19 declaration under Section 564(b)(1) of the Act, 21 U.S.C. section 360bbb-3(b)(1), unless the authorization is terminated or revoked sooner.    Influenza A by PCR NEGATIVE NEGATIVE Final   Influenza B by PCR NEGATIVE NEGATIVE Final    Comment: (NOTE) The Xpert Xpress SARS-CoV-2/FLU/RSV assay is intended as an aid in  the diagnosis of influenza from Nasopharyngeal swab specimens and  should not be used as a sole basis for treatment. Nasal washings and  aspirates are unacceptable for Xpert Xpress SARS-CoV-2/FLU/RSV  testing. Fact Sheet for Patients: PinkCheek.be Fact Sheet for Healthcare Providers: GravelBags.it This test is not yet approved or cleared by the [place] FDA and  has been authorized for detection and/or diagnosis of SARS-CoV-2 by  FDA under an Emergency Use Authorization (EUA). This EUA will remain  in effect (meaning this test can be used) for the duration of the  Covid-19 declaration under Section 564(b)(1) of the Act, 21  U.S.C. section 360bbb-3(b)(1), unless the authorization is  terminated or revoked. Performed at [facility], [place] [address], [place], [place] [address]       Radiology Studies: DG Chest 1 View  Result Date: 01/18/2020 CLINICAL DATA:  Fall, back pain, shortness of breath EXAM: CHEST  1 VIEW COMPARISON:  06/18/2017 FINDINGS: The heart size and mediastinal contours are within normal limits. Opacity with bubbly internal contents at the medial right lung base compatible with known moderate-sized hiatal hernia. Both lungs are otherwise clear. No pleural effusion or pneumothorax. Abnormal contour of the inferior aspect of the right glenoid neck concerning for fracture. IMPRESSION: 1. No active cardiopulmonary disease. 2. Abnormal cortical contour of the right scapula at the inferior glenoid neck concerning for  fracture. 3. Hiatal hernia. Electronically Signed   By: Davina Poke D.O.   On: 01/18/2020 15:47   DG Shoulder Right  Result Date: 01/18/2020 CLINICAL DATA:  Shoulder pain after fall EXAM: RIGHT SHOULDER - 2+ VIEW COMPARISON:  07/16/2016 FINDINGS: Cortical irregularity with subtle angulation of the right scapula at the inferior glenoid neck, which was slightly more conspicuous on the frontal chest radiograph which was obtained concurrently. Glenohumeral joint is intact without fracture or dislocation. AC joint is intact. Soft tissues appear within normal limits. IMPRESSION: Cortical irregularity with subtle angulation of the right scapula at the inferior glenoid neck, which raises suspicion for a nondisplaced fracture. If further evaluation of this area is clinically warranted, CT can be performed. Electronically Signed   By: Davina Poke D.O.   On: 01/18/2020 15:51   CT Cervical Spine Wo Contrast  Result Date: 01/18/2020 CLINICAL DATA:  79 year old  female with trauma. EXAM: CT CERVICAL SPINE WITHOUT CONTRAST TECHNIQUE: Multidetector CT imaging of the cervical spine was performed without intravenous contrast. Multiplanar CT image reconstructions were also generated. COMPARISON:  CT of the cervical spine dated 08/08/2018. FINDINGS: Evaluation of this exam is limited due to motion artifact. Alignment: No acute subluxation.  Grade 1 C4-C5 anterolisthesis. Skull base and vertebrae: No acute fracture.  Osteopenia. Soft tissues and spinal canal: No prevertebral fluid or swelling. No visible canal hematoma. Disc levels: Multilevel degenerative changes with endplate irregularity and disc space narrowing. There is multilevel facet arthropathy Upper chest: Mild emphysema. Other: Bilateral carotid bulb calcified plaques. IMPRESSION: 1. No definite acute/traumatic cervical spine pathology. 2. Multiple degenerative changes. Electronically Signed   By: Anner Crete M.D.   On: 01/18/2020 15:37   DG Hip Unilat  With Pelvis 2-3 Views Right  Result Date: 01/18/2020 CLINICAL DATA:  Fall, right hip pain EXAM: DG HIP (WITH OR WITHOUT PELVIS) 2-3V RIGHT COMPARISON:  CT 10/25/2016 FINDINGS: Mildly displaced fractures of the right superior and inferior pubic rami, which are new from prior. Pubic symphysis is intact without diastasis. SI joints are intact. Right hip joint is intact without fracture or dislocation. Bones are demineralized. Vascular calcifications. IMPRESSION: Mildly displaced fractures of the right superior and inferior pubic rami, which are new from prior. Electronically Signed   By: Davina Poke D.O.   On: 01/18/2020 15:42    Scheduled Meds: . atorvastatin  20 mg Oral Daily  . darifenacin  7.5 mg Oral Daily  . donepezil  10 mg Oral QPC supper  . enoxaparin (LOVENOX) injection  40 mg Subcutaneous Q24H  . labetalol  100 mg Oral BID  . loratadine  10 mg Oral Daily  . nicotine  21 mg Transdermal Daily  . pantoprazole  40 mg Oral Daily  . QUEtiapine  25 mg Oral Daily  . sodium chloride flush  3 mL Intravenous Q12H  . traZODone  200 mg Oral QHS  . venlafaxine XR  225 mg Oral Q breakfast   Continuous Infusions:   LOS: 1 day   Time spent: 28 minutes   Darliss Cheney, MD [facility]  01/20/2020, 12:57 PM   To contact the attending provider between 7A-7P or the covering provider during after hours 7P-7A, please log into the web site [email or web address].

## 2020-01-20 NOTE — Progress Notes (Signed)
Keeps removing sling reinforced numerous times purpose of device

## 2020-01-21 DIAGNOSIS — J449 Chronic obstructive pulmonary disease, unspecified: Secondary | ICD-10-CM

## 2020-01-21 LAB — CBC
HCT: 37.5 % (ref 36.0–46.0)
Hemoglobin: 12.4 g/dL (ref 12.0–15.0)
MCH: 29.7 pg (ref 26.0–34.0)
MCHC: 33.1 g/dL (ref 30.0–36.0)
MCV: 89.7 fL (ref 80.0–100.0)
Platelets: 125 10*3/uL — ABNORMAL LOW (ref 150–400)
RBC: 4.18 MIL/uL (ref 3.87–5.11)
RDW: 13.3 % (ref 11.5–15.5)
WBC: 5.7 10*3/uL (ref 4.0–10.5)
nRBC: 0 % (ref 0.0–0.2)

## 2020-01-21 LAB — BASIC METABOLIC PANEL
Anion gap: 10 (ref 5–15)
BUN: 21 mg/dL (ref 8–23)
CO2: 23 mmol/L (ref 22–32)
Calcium: 8.5 mg/dL — ABNORMAL LOW (ref 8.9–10.3)
Chloride: 102 mmol/L (ref 98–111)
Creatinine, Ser: 0.6 mg/dL (ref 0.44–1.00)
GFR calc Af Amer: >60 mL/min (ref >60)
GFR calc non Af Amer: >60 mL/min (ref >60)
Glucose, Bld: 123 mg/dL — ABNORMAL HIGH (ref 70–99)
Potassium: 3.9 mmol/L (ref 3.5–5.1)
Sodium: 135 mmol/L (ref 135–145)

## 2020-01-21 NOTE — Progress Notes (Addendum)
NCM received consult for possible SNF placement at time of discharge. NCM spoke with patient and patient son regarding PT recommendation of SNF placement at time of discharge. Patient reported that she wants to get better and   is currently unable to care for self at the ALF given patient's current physical needs and fall risk. Patient expressed understanding of PT recommendation and is agreeable to SNF placement at time of discharge. Patient reports preference for   .  [facility] , however , [facility] will not have any beds available til Wednesday. [facility] , no nonCOVID beds available. [facility] pt's  3rd choice. [facility] will be able to accept pt on Monday. MD awre of updated COVID needed. NCM discussed insurance authorization process and provided Medicare SNF ratings list. Patient expressed being hopeful for rehab and to feel better soon. No further questions reported at this time. NCM to continue to follow and assist with discharge planning needs.

## 2020-01-21 NOTE — Plan of Care (Signed)
  Problem: Education: Goal: Verbalization of understanding the information provided will improve Outcome: Progressing   Problem: Coping: Goal: Level of anxiety will decrease Outcome: Progressing   Problem: Physical Regulation: Goal: Postoperative complications will be avoided or minimized Outcome: Progressing   Problem: Respiratory: Goal: Ability to maintain a clear airway will improve Outcome: Progressing   Problem: Pain Management: Goal: Pain level will decrease Outcome: Progressing   Problem: Skin Integrity: Goal: Signs of wound healing will improve Outcome: Progressing   Problem: Tissue Perfusion: Goal: Ability to maintain adequate tissue perfusion will improve Outcome: Progressing

## 2020-01-21 NOTE — Progress Notes (Signed)
Physical Therapy Treatment Patient Details Name: Kendra Hernandez MRN: [id number] DOB: 1941-04-15 Today's Date: 01/21/2020    History of Present Illness Kendra Hernandez is a 79 y.o. female with medical history significant of hypertension, hyperlipidemia, COPD, and tobacco abuse.  She presents after having a witnessed fall with complaints of pain in her right hip, back, and right shoulder.Acute right upper and lower pubic ramus fractures and right scapular fracture secondary to fall. Orthopedics was consulted and no surgery recommended. Sling for comfort.    PT Comments    Pt remains very limited secondary to pain and weakness. Pt reporting pain mostly in her hips (R>L). She continues to require heavy physical assistance of two for all mobility at this time. Pt would continue to benefit from skilled physical therapy services at this time while admitted and after d/c to address the below listed limitations in order to improve overall safety and independence with functional mobility.    Follow Up Recommendations  SNF     Equipment Recommendations  None recommended by PT    Recommendations for Other Services       Precautions / Restrictions Precautions Precautions: Fall Restrictions Weight Bearing Restrictions: Yes RUE Weight Bearing: Non weight bearing RLE Weight Bearing: Weight bearing as tolerated LLE Weight Bearing: Weight bearing as tolerated    Mobility  Bed Mobility Overal bed mobility: Needs Assistance Bed Mobility: Supine to Sit     Supine to sit: Max assist;+2 for physical assistance     General bed mobility comments: assist with LEs to EOB and to elevate trunk  Transfers Overall transfer level: Needs assistance Equipment used: 2 person hand held assist Transfers: Squat Pivot Transfers     Squat pivot transfers: Total assist;+2 physical assistance     General transfer comment: pt unable to assist at all secondary to pain and pt not wanting to move.    Ambulation/Gait                 Stairs             Wheelchair Mobility    Modified Rankin (Stroke Patients Only)       Balance Overall balance assessment: Needs assistance Sitting-balance support: Feet unsupported Sitting balance-Leahy Scale: Poor     Standing balance support: Bilateral upper extremity supported;During functional activity Standing balance-Leahy Scale: Poor                              Cognition Arousal/Alertness: Awake/alert Behavior During Therapy: WFL for tasks assessed/performed Overall Cognitive Status: Within Functional Limits for tasks assessed                                        Exercises      General Comments        Pertinent Vitals/Pain Pain Assessment: Faces Faces Pain Scale: Hurts whole lot Pain Location: hips Pain Descriptors / Indicators: Grimacing;Guarding;Moaning Pain Intervention(s): Monitored during session;Repositioned    Home Living                      Prior Function            PT Goals (current goals can now be found in the care plan section) Acute Rehab PT Goals PT Goal Formulation: With patient Time For Goal Achievement: 02/02/20 Potential to Achieve Goals: Good Progress towards PT goals:  Progressing toward goals    Frequency    Min 2X/week      PT Plan Current plan remains appropriate    Co-evaluation              AM-PAC PT "6 Clicks" Mobility   Outcome Measure  Help needed turning from your back to your side while in a flat bed without using bedrails?: Total Help needed moving from lying on your back to sitting on the side of a flat bed without using bedrails?: Total Help needed moving to and from a bed to a chair (including a wheelchair)?: Total Help needed standing up from a chair using your arms (e.g., wheelchair or bedside chair)?: Total Help needed to walk in hospital room?: Total Help needed climbing 3-5 steps with a railing? :  Total 6 Click Score: 6    End of Session Equipment Utilized During Treatment: Gait belt Activity Tolerance: Patient limited by pain Patient left: in chair;with call bell/phone within reach;with chair alarm set Nurse Communication: Mobility status;Weight bearing status PT Visit Diagnosis: Other abnormalities of gait and mobility (R26.89);Pain Pain - Right/Left: Right Pain - part of body: Hip     Time: 1003-1017 PT Time Calculation (min) (ACUTE ONLY): 14 min  Charges:  $Therapeutic Activity: 8-22 mins                     Anastasio Champion, DPT  Acute Rehabilitation Services Pager [phone] Office [place] 01/21/2020, 12:50 PM

## 2020-01-21 NOTE — Progress Notes (Signed)
PROGRESS NOTE    Kendra Hernandez  [id number] DOB: 26-May-1941 DOA: 01/18/2020 PCP: Seward Carol, MD   Brief Narrative:  HPI On 01/18/2020 by Dr. Fuller Plan Kendra Hernandez is a 79 y.o. female with medical history significant of hypertension, hyperlipidemia, COPD, and tobacco abuse.  She presents after having a witnessed fall with complaints of pain in her right hip, back, and right shoulder.  At baseline she ambulates with the use of prior to walker and had been using it when she was coming from lunch.  She reports that her feet got tangled up causing her to trip and fall.  She was unable to bear weight on her feet thereafter.  Denies any trauma to her head or lose consciousness.  Patient reports associated symptoms of shortness of breath, but she reports that this is chronic.  Normally she does not require oxygen at home and admits to smoking half pack cigarettes per day on average.  Interim history Patient admitted for right upper and lower pubic rami fractures as well as right scapular fracture secondary to fall.  Orthopedics consulted and recommended supportive management.  PT consulted and recommending SNF.  TOC consulted and pending.  Patient was also noted to be hypoxic on admission however this appears to have resolved. Assessment & Plan   Acute right upper and lower pubic rami fractures/right scapular fracture -Secondary to fall -Orthopedics consulted and appreciated, recommended supportive management and PT consult, weight-bear as tolerated -PT and OT consulted, recommending SNF -Continue pain control -TOC consulted for placement  Transient hypoxia -On admission, patient was noted to have oxygen saturations noted to be between 89 to 91% on room air -Chest x-ray showed right scapular fracture -Suspect hypoxia was secondary to pain and atelectasis -Currently maintaining oxygen saturations in the mid 90s on room air -Continue incentive spirometer  Essential  hypertension -Continue labetalol as needed with holding parameters  COPD without exacerbation -Patient does have a history of tobacco abuse -Continue albuterol as needed  Dementia -Currently alert and oriented x3 -Continue Aricept  Hyperlipidemia -Continue statin  Tobacco abuse -Smoking cessation discussed  Insomnia -Continue Seroquel, trazodone  GERD -Continue PPI  DVT Prophylaxis  Lovenox  Code Status: Full  Family Communication: None at bedside  Disposition Plan: admitted, pending SNF placement  Consultants Orthopedic surgery  Procedures  none  Antibiotics   Anti-infectives (From admission, onward)   None      Subjective:   Kendra Hernandez seen and examined today.  Patient states that her right shoulder arm hurts with movement.  Denies any further pain.  Denies current chest pain, shortness of breath, abdominal pain, nausea vomiting, diarrhea or constipation, dizziness or headache.   Objective:   Vitals:   01/20/20 1451 01/20/20 2005 01/21/20 0444 01/21/20 0723  BP: (!) 141/52 139/75 (!) 98/48 125/65  Pulse: 72 83 70 75  Resp: 17 15 16 16   Temp: 98.4 F (36.9 C) 98.2 F (36.8 C) 97.9 F (36.6 C) 98.2 F (36.8 C)  TempSrc: Oral Oral Oral Oral  SpO2: 94% 95% 93% 93%  Weight:      Height:        Intake/Output Summary (Last 24 hours) at 01/21/2020 0954 Last data filed at 01/21/2020 0809 Gross per 24 hour  Intake 243 ml  Output 400 ml  Net -157 ml   Filed Weights   01/18/20 1323  Weight: 56.7 kg    Exam  General: Well developed, elderly, NAD  HEENT: NCAT, mucous membranes moist.   Cardiovascular: S1 S2  auscultated, + SEM, RRR  Respiratory: Clear to auscultation bilaterally with equal chest rise  Abdomen: Soft, nontender, nondistended, + bowel sounds  Extremities: warm dry without cyanosis clubbing or edema of LE. R shoulder/scapula edema  Neuro: AAOx3, hard of hearing otherwise nonfocal  Psych: Pleasant, appropriate mood and  affect   Data Reviewed: I have personally reviewed following labs and imaging studies  CBC: Recent Labs  Lab 01/18/20 1413 01/20/20 0236 01/21/20 0515  WBC 7.0 5.5 5.7  NEUTROABS 4.7  --   --   HGB 14.1 12.6 12.4  HCT 43.1 38.5 37.5  MCV 90.7 90.0 89.7  PLT 157 132* 0000000*   Basic Metabolic Panel: Recent Labs  Lab 01/18/20 1413 01/19/20 0215 01/20/20 0236 01/21/20 0515  NA 139 136 136 135  K 4.0 3.7 3.5 3.9  CL 107 104 105 102  CO2 24 23 23 23   GLUCOSE 103* 99 115* 123*  BUN 12 12 19 21   CREATININE 0.66 0.58 0.66 0.60  CALCIUM 9.3 8.8* 8.8* 8.5*   GFR: Estimated Creatinine Clearance: 53.6 mL/min (by C-G formula based on SCr of 0.6 mg/dL). Liver Function Tests: Recent Labs  Lab 01/18/20 1413  AST 19  ALT 16  ALKPHOS 100  BILITOT 0.7  PROT 6.6  ALBUMIN 3.9   No results for input(s): LIPASE, AMYLASE in the last 168 hours. No results for input(s): AMMONIA in the last 168 hours. Coagulation Profile: No results for input(s): INR, PROTIME in the last 168 hours. Cardiac Enzymes: No results for input(s): CKTOTAL, CKMB, CKMBINDEX, TROPONINI in the last 168 hours. BNP (last 3 results) No results for input(s): PROBNP in the last 8760 hours. HbA1C: No results for input(s): HGBA1C in the last 72 hours. CBG: No results for input(s): GLUCAP in the last 168 hours. Lipid Profile: No results for input(s): CHOL, HDL, LDLCALC, TRIG, CHOLHDL, LDLDIRECT in the last 72 hours. Thyroid Function Tests: Recent Labs    01/18/20 2224  TSH 1.047   Anemia Panel: No results for input(s): VITAMINB12, FOLATE, FERRITIN, TIBC, IRON, RETICCTPCT in the last 72 hours. Urine analysis:    Component Value Date/Time   COLORURINE YELLOW 06/26/2017 2151   APPEARANCEUR HAZY (A) 06/26/2017 2151   LABSPEC 1.011 06/26/2017 2151   PHURINE 7.0 06/26/2017 2151   GLUCOSEU NEGATIVE 06/26/2017 2151   HGBUR NEGATIVE 06/26/2017 2151   [place] 06/26/2017 2151   KETONESUR 20 (A)  06/26/2017 2151   PROTEINUR NEGATIVE 06/26/2017 2151   NITRITE NEGATIVE 06/26/2017 2151   LEUKOCYTESUR NEGATIVE 06/26/2017 2151   Sepsis Labs: @LABRCNTIP (procalcitonin:4,lacticidven:4)  ) Recent Results (from the past 240 hour(s))  Respiratory Panel by RT PCR (Flu A&B, Covid) - Nasopharyngeal Swab     Status: None   Collection Time: 01/18/20  2:35 PM   Specimen: Nasopharyngeal Swab  Result Value Ref Range Status   SARS Coronavirus 2 by RT PCR NEGATIVE NEGATIVE Final    Comment: (NOTE) SARS-CoV-2 target nucleic acids are NOT DETECTED. The SARS-CoV-2 RNA is generally detectable in upper respiratoy specimens during the acute phase of infection. The lowest concentration of SARS-CoV-2 viral copies this assay can detect is 131 copies/mL. A negative result does not preclude SARS-Cov-2 infection and should not be used as the sole basis for treatment or other patient management decisions. A negative result may occur with  improper specimen collection/handling, submission of specimen other than nasopharyngeal swab, presence of viral mutation(s) within the areas targeted by this assay, and inadequate number of viral copies (<131 copies/mL). A negative result  must be combined with clinical observations, patient history, and epidemiological information. The expected result is Negative. Fact Sheet for Patients:  PinkCheek.be Fact Sheet for Healthcare Providers:  GravelBags.it This test is not yet ap proved or cleared by the [place] FDA and  has been authorized for detection and/or diagnosis of SARS-CoV-2 by FDA under an Emergency Use Authorization (EUA). This EUA will remain  in effect (meaning this test can be used) for the duration of the COVID-19 declaration under Section 564(b)(1) of the Act, 21 U.S.C. section 360bbb-3(b)(1), unless the authorization is terminated or revoked sooner.    Influenza A by PCR NEGATIVE NEGATIVE  Final   Influenza B by PCR NEGATIVE NEGATIVE Final    Comment: (NOTE) The Xpert Xpress SARS-CoV-2/FLU/RSV assay is intended as an aid in  the diagnosis of influenza from Nasopharyngeal swab specimens and  should not be used as a sole basis for treatment. Nasal washings and  aspirates are unacceptable for Xpert Xpress SARS-CoV-2/FLU/RSV  testing. Fact Sheet for Patients: PinkCheek.be Fact Sheet for Healthcare Providers: GravelBags.it This test is not yet approved or cleared by the [place] FDA and  has been authorized for detection and/or diagnosis of SARS-CoV-2 by  FDA under an Emergency Use Authorization (EUA). This EUA will remain  in effect (meaning this test can be used) for the duration of the  Covid-19 declaration under Section 564(b)(1) of the Act, 21  U.S.C. section 360bbb-3(b)(1), unless the authorization is  terminated or revoked. Performed at [facility], [place] [address], [place], [place] [address]       Radiology Studies: No results found.   Scheduled Meds: . atorvastatin  20 mg Oral Daily  . darifenacin  7.5 mg Oral Daily  . donepezil  10 mg Oral QPC supper  . enoxaparin (LOVENOX) injection  40 mg Subcutaneous Q24H  . labetalol  100 mg Oral BID  . loratadine  10 mg Oral Daily  . nicotine  21 mg Transdermal Daily  . pantoprazole  40 mg Oral Daily  . QUEtiapine  25 mg Oral Daily  . sodium chloride flush  3 mL Intravenous Q12H  . traZODone  200 mg Oral QHS  . venlafaxine XR  225 mg Oral Q breakfast   Continuous Infusions:   LOS: 2 days   Time Spent in minutes   30 minutes  Zykira Matlack D.O. on 01/21/2020 at 9:54 AM  Between 7am to 7pm - Please see pager noted on [email or web address]  After 7pm go to [email or web address]  And look for the night coverage person covering for me after hours  [facility] Office  [phone]

## 2020-01-21 NOTE — NC FL2 (Signed)
[place] MEDICAID FL2 LEVEL OF CARE SCREENING TOOL     IDENTIFICATION  Patient Name: Kendra Hernandez Birthdate: 1941-01-24 Sex: female Admission Date (Current Location): 01/18/2020  [facility] and [place] Number:  Herbalist and Address:  [facility], [place] [address] [place], [place] [address]      Provider Number: [id number]  Attending Physician Name and Address:  Cristal Ford, DO  Relative Name and Phone Number:  Amelia Jo [phone]    Current Level of Care: Hospital Recommended Level of Care: [place] Prior Approval Number:    Date Approved/Denied:   PASRR Number: [id number]  Discharge Plan: SNF    Current Diagnoses: Patient Active Problem List   Diagnosis Date Noted  . Multiple closed stable fractures of pubic ramus ([place]) 01/18/2020  . Hypoxia 01/18/2020  . Closed right scapular fracture 01/18/2020  . COPD (chronic obstructive pulmonary disease) ([place]) 01/18/2020  . Dementia with behavioral disturbance ([place]) 11/11/2017  . Chronic ischemic right MCA stroke 11/11/2017  . Alcoholism, chronic ([place]) 06/27/2017  . Isopropyl alcohol poisoning   . Major depressive disorder, recurrent episode ([place]) 12/21/2016  . Major depressive disorder, recurrent episode, severe ([place]) 11/24/2016  . Alcohol withdrawal ([place]) 07/17/2016  . Withdrawal symptoms, alcohol ([place]) 07/17/2016  . Hypothyroidism 07/17/2016  . History of CVA (cerebrovascular accident) 07/17/2016  . Head contusion 07/17/2016  . Fall 07/17/2016  . Insomnia 07/17/2016  . Essential hypertension 07/17/2016  . Abnormality of gait 01/24/2016  . Memory loss 01/24/2016  . Acute right MCA stroke ([place]) 01/09/2016  . Essential hypertension, benign 01/09/2016  . Hyperlipidemia LDL goal <100 01/09/2016  . Other specified hypothyroidism 01/09/2016  . Cervicalgia 01/09/2016  . Esophageal reflux 01/09/2016  . Major depression, chronic 01/09/2016    Orientation  RESPIRATION BLADDER Height & Weight     Self, Time, Situation, Place  Normal Incontinent Weight: 56.7 kg Height:  5\' 5"  (165.1 cm)  BEHAVIORAL SYMPTOMS/MOOD NEUROLOGICAL BOWEL NUTRITION STATUS      Continent Diet(Refer to d/c summary)  AMBULATORY STATUS COMMUNICATION OF NEEDS Skin   Extensive Assist Verbally Normal                       Personal Care Assistance Level of Assistance  Bathing, Feeding, Dressing Bathing Assistance: Limited assistance Feeding assistance: Independent Dressing Assistance: Limited assistance     Functional Limitations Info  Sight, Hearing, Speech Sight Info: Adequate Hearing Info: Adequate Speech Info: Adequate    SPECIAL CARE FACTORS FREQUENCY  PT (By licensed PT), OT (By licensed OT)     PT Frequency: 5 x/ week OT Frequency: 5 x/ week            Contractures Contractures Info: Not present    Additional Factors Info  Code Status, Allergies Code Status Info: Full Code Allergies Info: Penicillins, Ceftin,Cefuroxime Axetil, Simvastatin           Current Medications (01/21/2020):  This is the current hospital active medication list Current Facility-Administered Medications  Medication Dose Route Frequency Provider Last Rate Last Admin  . acetaminophen (TYLENOL) tablet 650 mg  650 mg Oral Q6H PRN Fuller Plan A, MD   650 mg at 01/20/20 1841   Or  . acetaminophen (TYLENOL) suppository 650 mg  650 mg Rectal Q6H PRN Smith, Rondell A, MD      . albuterol (PROVENTIL) (2.5 MG/3ML) 0.083% nebulizer solution 2.5 mg  2.5 mg Nebulization Q6H PRN Norval Morton, MD      .  atorvastatin (LIPITOR) tablet 20 mg  20 mg Oral Daily Fuller Plan A, MD   20 mg at 01/21/20 [id number]  . darifenacin (ENABLEX) 24 hr tablet 7.5 mg  7.5 mg Oral Daily Einar Grad, RPH   7.5 mg at 01/21/20 [id number]  . donepezil (ARICEPT) tablet 10 mg  10 mg Oral QPC supper Fuller Plan A, MD   10 mg at 01/20/20 1732  . enoxaparin (LOVENOX) injection 40 mg  40 mg  Subcutaneous Q24H Smith, Rondell A, MD   40 mg at 01/20/20 1735  . fentaNYL (SUBLIMAZE) injection 25 mcg  25 mcg Intravenous Q2H PRN Fuller Plan A, MD      . HYDROcodone-acetaminophen (NORCO/VICODIN) 5-325 MG per tablet 1 tablet  1 tablet Oral Q6H PRN Fuller Plan A, MD   1 tablet at 01/21/20 1111  . hydrOXYzine (ATARAX/VISTARIL) tablet 50 mg  50 mg Oral Q12H PRN Fuller Plan A, MD      . labetalol (NORMODYNE) tablet 100 mg  100 mg Oral BID Cristal Ford, DO   100 mg at 01/21/20 [id number]  . loperamide (IMODIUM) capsule 2 mg  2 mg Oral PRN Fuller Plan A, MD      . loratadine (CLARITIN) tablet 10 mg  10 mg Oral Daily Fuller Plan A, MD   10 mg at 01/21/20 [id number]  . nicotine (NICODERM CQ - dosed in mg/24 hours) patch 21 mg  21 mg Transdermal Daily Tamala Julian, Rondell A, MD   21 mg at 01/20/20 1734  . ondansetron (ZOFRAN) tablet 4 mg  4 mg Oral Q6H PRN Fuller Plan A, MD       Or  . ondansetron (ZOFRAN) injection 4 mg  4 mg Intravenous Q6H PRN Smith, Rondell A, MD      . pantoprazole (PROTONIX) EC tablet 40 mg  40 mg Oral Daily Fuller Plan A, MD   40 mg at 01/21/20 [id number]  . QUEtiapine (SEROQUEL) tablet 25 mg  25 mg Oral Daily Fuller Plan A, MD   25 mg at 01/21/20 [id number]  . sodium chloride flush (NS) 0.9 % injection 3 mL  3 mL Intravenous Q12H Smith, Rondell A, MD   3 mL at 01/21/20 0809  . traZODone (DESYREL) tablet 200 mg  200 mg Oral QHS Smith, Rondell A, MD   200 mg at 01/20/20 2049  . trolamine salicylate (ASPERCREME) 10 % cream 1 application  1 application Topical PRN Tamala Julian, Rondell A, MD      . venlafaxine XR (EFFEXOR-XR) 24 hr capsule 225 mg  225 mg Oral Q breakfast Fuller Plan A, MD   225 mg at 01/21/20 0808     Discharge Medications: Please see discharge summary for a list of discharge medications.  Relevant Imaging Results:  Relevant Lab Results:   Additional Information [id number]  Sharin Mons, RN

## 2020-01-21 NOTE — Progress Notes (Signed)
Subjective:     Patient reports pain as mild to moderate. Notes that her R shoulder is sore.  Resting comfortably in bed this morning.   Objective:   VITALS:  Temp:  [97.7 F (36.5 C)-98.4 F (36.9 C)] 98.2 F (36.8 C) (01/22 0723) Pulse Rate:  [68-83] 75 (01/22 0723) Resp:  [15-17] 16 (01/22 0723) BP: (98-141)/(48-75) 125/65 (01/22 0723) SpO2:  [93 %-96 %] 93 % (01/22 0723)  General: WDWN patient in NAD. Psych:  Appropriate mood and affect. Neuro:  A&O x 3, Moving all extremities, sensation intact to light touch HEENT:  EOMs intact Chest:  Even non-labored respirations Skin:C/D/I, no rashes or lesions Extremities: warm/dry, mild edema to R shoulder/scapula, noerythema or echymosis.  No lymphadenopathy. Pulses: Popliteus and radial 2+ MSK:  ROM: full Right wrist ROM compared bilaterally, TKE MMT: able to perform quad set, 5/5 grip strength    LABS Recent Labs    01/18/20 1413 01/18/20 1413 01/20/20 0236 01/21/20 0515  HGB 14.1  --  12.6 12.4  WBC 7.0   < > 5.5 5.7  PLT 157   < > 132* 125*   < > = values in this interval not displayed.   Recent Labs    01/20/20 0236 01/21/20 0515  NA 136 135  K 3.5 3.9  CL 105 102  CO2 23 23  BUN 19 21  CREATININE 0.66 0.60  GLUCOSE 115* 123*   No results for input(s): LABPT, INR in the last 72 hours.   Assessment/Plan:    R nondisplaced scapula fx Nondisplaced R superior and inferior pubic ramus fx  Sling to R UE Gentle pendulum exercises to R UE WBAT bilateral LE Up with therapy Disp: likely SNF Plan for outpatient f/u visit with Dr. Doran Durand in 3 weeks. Ortho signing off.  Mechele Claude PA-C [facility] Office:  [phone]   Marya Amsler [facility] 01/21/2020, 7:38 AM

## 2020-01-22 NOTE — TOC Progression Note (Signed)
Transition of Care [facility]) - Progression Note    Patient Details  Name: Kendra Hernandez MRN: [id number] Date of Birth: 1941-02-05  [facility]) CM/SW [place], [place] Phone Number: 01/22/2020, 9:03 AM  Clinical Narrative:    CSW spoke with MD regarding new COVID swab as plan per weekday RNCM is for pt to transition to SNF on Monday at [facility]- Dr. Ree Kida aware and will order swab.    Expected Discharge Plan: [place] Barriers to Discharge: Continued Medical Work up, Other (comment)(new COVID swab)  Expected Discharge Plan and Services Expected Discharge Plan: [place] In-house Referral: Clinical Social Work Discharge Planning Services: CM Consult Post Acute Care Choice: [place] Living arrangements for the past 2 months: [place]  Readmission Risk Interventions No flowsheet data found.

## 2020-01-22 NOTE — Progress Notes (Signed)
PROGRESS NOTE    Kendra Hernandez  [id number] DOB: Jun 24, 1941 DOA: 01/18/2020 PCP: Seward Carol, MD   Brief Narrative:  HPI On 01/18/2020 by Dr. Fuller Plan Kendra Hernandez is a 79 y.o. female with medical history significant of hypertension, hyperlipidemia, COPD, and tobacco abuse.  She presents after having a witnessed fall with complaints of pain in her right hip, back, and right shoulder.  At baseline she ambulates with the use of prior to walker and had been using it when she was coming from lunch.  She reports that her feet got tangled up causing her to trip and fall.  She was unable to bear weight on her feet thereafter.  Denies any trauma to her head or lose consciousness.  Patient reports associated symptoms of shortness of breath, but she reports that this is chronic.  Normally she does not require oxygen at home and admits to smoking half pack cigarettes per day on average.  Interim history Patient admitted for right upper and lower pubic rami fractures as well as right scapular fracture secondary to fall.  Orthopedics consulted and recommended supportive management.  PT consulted and recommending SNF.  TOC consulted and pending.  Patient was also noted to be hypoxic on admission however this appears to have resolved. Assessment & Plan   Acute right upper and lower pubic rami fractures/right scapular fracture -Secondary to fall -Orthopedics consulted and appreciated, recommended supportive management and PT consult, weight-bear as tolerated -PT and OT consulted, recommending SNF -Continue pain control -TOC consulted for placement- hopefully to occur on 01/24/20 -patient continues to need SNF given that she is elderly with multiple fractures and continues to have pain and weakness which has limited her mobility.   Transient hypoxia -On admission, patient was noted to have oxygen saturations noted to be between 89 to 91% on room air -Chest x-ray showed right scapular  fracture -Suspect hypoxia was secondary to pain and atelectasis -Continue incentive spirometery  Essential hypertension -Continue labetalol as needed with holding parameters  COPD without exacerbation -Patient does have a history of tobacco abuse -Continue albuterol as needed  Dementia -Currently alert and oriented x3 -Continue Aricept  Hyperlipidemia -Continue statin  Tobacco abuse -Smoking cessation discussed  Insomnia -Continue Seroquel, trazodone  GERD -Continue PPI  DVT Prophylaxis  Lovenox  Code Status: Full  Family Communication: None at bedside  Disposition Plan: admitted, pending SNF placement on 1/25. Pending COVID test.  Consultants Orthopedic surgery  Procedures  none  Antibiotics   Anti-infectives (From admission, onward)   None      Subjective:   Kendra Hernandez seen and examined today.  Continues to have pain in the right shoulder. Denies current chest pain, shortness of breath, abdominal pain, N/V/C/D.   Objective:   Vitals:   01/21/20 1305 01/21/20 1949 01/22/20 0314 01/22/20 0831  BP: (!) 120/58 121/65 136/67 132/64  Pulse: 80 60 77 75  Resp: 14     Temp: 98.9 F (37.2 C) 98.3 F (36.8 C) 98.1 F (36.7 C) 98.4 F (36.9 C)  TempSrc: Oral Oral Oral Oral  SpO2: 91% 92% 91% 97%  Weight:      Height:        Intake/Output Summary (Last 24 hours) at 01/22/2020 1056 Last data filed at 01/22/2020 1039 Gross per 24 hour  Intake 103 ml  Output --  Net 103 ml   Filed Weights   01/18/20 1323  Weight: 56.7 kg   Exam  General: Well developed, well nourished, elderly, NAD  HEENT:  NCAT, mucous membranes moist.   Cardiovascular: S1 S2 auscultated, SEM, RRR  Respiratory: Clear to auscultation bilaterally   Abdomen: Soft, nontender, nondistended, + bowel sounds  Extremities: warm dry without cyanosis clubbing or edema of LE. R shoulder/scapular edema  Neuro: AAOx3, hard of hearing, otherwise nonfocal  Psych: Appropriate mood  and affect, pleasant   Data Reviewed: I have personally reviewed following labs and imaging studies  CBC: Recent Labs  Lab 01/18/20 1413 01/20/20 0236 01/21/20 0515  WBC 7.0 5.5 5.7  NEUTROABS 4.7  --   --   HGB 14.1 12.6 12.4  HCT 43.1 38.5 37.5  MCV 90.7 90.0 89.7  PLT 157 132* 0000000*   Basic Metabolic Panel: Recent Labs  Lab 01/18/20 1413 01/19/20 0215 01/20/20 0236 01/21/20 0515  NA 139 136 136 135  K 4.0 3.7 3.5 3.9  CL 107 104 105 102  CO2 24 23 23 23   GLUCOSE 103* 99 115* 123*  BUN 12 12 19 21   CREATININE 0.66 0.58 0.66 0.60  CALCIUM 9.3 8.8* 8.8* 8.5*   GFR: Estimated Creatinine Clearance: 53.6 mL/min (by C-G formula based on SCr of 0.6 mg/dL). Liver Function Tests: Recent Labs  Lab 01/18/20 1413  AST 19  ALT 16  ALKPHOS 100  BILITOT 0.7  PROT 6.6  ALBUMIN 3.9   No results for input(s): LIPASE, AMYLASE in the last 168 hours. No results for input(s): AMMONIA in the last 168 hours. Coagulation Profile: No results for input(s): INR, PROTIME in the last 168 hours. Cardiac Enzymes: No results for input(s): CKTOTAL, CKMB, CKMBINDEX, TROPONINI in the last 168 hours. BNP (last 3 results) No results for input(s): PROBNP in the last 8760 hours. HbA1C: No results for input(s): HGBA1C in the last 72 hours. CBG: No results for input(s): GLUCAP in the last 168 hours. Lipid Profile: No results for input(s): CHOL, HDL, LDLCALC, TRIG, CHOLHDL, LDLDIRECT in the last 72 hours. Thyroid Function Tests: No results for input(s): TSH, T4TOTAL, FREET4, T3FREE, THYROIDAB in the last 72 hours. Anemia Panel: No results for input(s): VITAMINB12, FOLATE, FERRITIN, TIBC, IRON, RETICCTPCT in the last 72 hours. Urine analysis:    Component Value Date/Time   COLORURINE YELLOW 06/26/2017 2151   APPEARANCEUR HAZY (A) 06/26/2017 2151   LABSPEC 1.011 06/26/2017 2151   PHURINE 7.0 06/26/2017 2151   GLUCOSEU NEGATIVE 06/26/2017 2151   HGBUR NEGATIVE 06/26/2017 2151    [place] 06/26/2017 2151   KETONESUR 20 (A) 06/26/2017 2151   PROTEINUR NEGATIVE 06/26/2017 2151   NITRITE NEGATIVE 06/26/2017 2151   LEUKOCYTESUR NEGATIVE 06/26/2017 2151   Sepsis Labs: @LABRCNTIP (procalcitonin:4,lacticidven:4)  ) Recent Results (from the past 240 hour(s))  Respiratory Panel by RT PCR (Flu A&B, Covid) - Nasopharyngeal Swab     Status: None   Collection Time: 01/18/20  2:35 PM   Specimen: Nasopharyngeal Swab  Result Value Ref Range Status   SARS Coronavirus 2 by RT PCR NEGATIVE NEGATIVE Final    Comment: (NOTE) SARS-CoV-2 target nucleic acids are NOT DETECTED. The SARS-CoV-2 RNA is generally detectable in upper respiratoy specimens during the acute phase of infection. The lowest concentration of SARS-CoV-2 viral copies this assay can detect is 131 copies/mL. A negative result does not preclude SARS-Cov-2 infection and should not be used as the sole basis for treatment or other patient management decisions. A negative result may occur with  improper specimen collection/handling, submission of specimen other than nasopharyngeal swab, presence of viral mutation(s) within the areas targeted by this assay, and inadequate number of  viral copies (<131 copies/mL). A negative result must be combined with clinical observations, patient history, and epidemiological information. The expected result is Negative. Fact Sheet for Patients:  PinkCheek.be Fact Sheet for Healthcare Providers:  GravelBags.it This test is not yet ap proved or cleared by the [place] FDA and  has been authorized for detection and/or diagnosis of SARS-CoV-2 by FDA under an Emergency Use Authorization (EUA). This EUA will remain  in effect (meaning this test can be used) for the duration of the COVID-19 declaration under Section 564(b)(1) of the Act, 21 U.S.C. section 360bbb-3(b)(1), unless the authorization is terminated  or revoked sooner.    Influenza A by PCR NEGATIVE NEGATIVE Final   Influenza B by PCR NEGATIVE NEGATIVE Final    Comment: (NOTE) The Xpert Xpress SARS-CoV-2/FLU/RSV assay is intended as an aid in  the diagnosis of influenza from Nasopharyngeal swab specimens and  should not be used as a sole basis for treatment. Nasal washings and  aspirates are unacceptable for Xpert Xpress SARS-CoV-2/FLU/RSV  testing. Fact Sheet for Patients: PinkCheek.be Fact Sheet for Healthcare Providers: GravelBags.it This test is not yet approved or cleared by the [place] FDA and  has been authorized for detection and/or diagnosis of SARS-CoV-2 by  FDA under an Emergency Use Authorization (EUA). This EUA will remain  in effect (meaning this test can be used) for the duration of the  Covid-19 declaration under Section 564(b)(1) of the Act, 21  U.S.C. section 360bbb-3(b)(1), unless the authorization is  terminated or revoked. Performed at [facility], [place] [address], [place], [place] [address]       Radiology Studies: No results found.   Scheduled Meds: . atorvastatin  20 mg Oral Daily  . darifenacin  7.5 mg Oral Daily  . donepezil  10 mg Oral QPC supper  . enoxaparin (LOVENOX) injection  40 mg Subcutaneous Q24H  . labetalol  100 mg Oral BID  . loratadine  10 mg Oral Daily  . nicotine  21 mg Transdermal Daily  . pantoprazole  40 mg Oral Daily  . QUEtiapine  25 mg Oral Daily  . sodium chloride flush  3 mL Intravenous Q12H  . traZODone  200 mg Oral QHS  . venlafaxine XR  225 mg Oral Q breakfast   Continuous Infusions:   LOS: 3 days   Time Spent in minutes   30 minutes  Carnita Golob D.O. on 01/22/2020 at 10:56 AM  Between 7am to 7pm - Please see pager noted on [email or web address]  After 7pm go to [email or web address]  And look for the night coverage person covering for me after hours  [facility] Office  [phone]

## 2020-01-22 NOTE — Plan of Care (Signed)
  Problem: Pain Managment: Goal: General experience of comfort will improve Outcome: Progressing   Problem: Safety: Goal: Ability to remain free from injury will improve Outcome: Progressing   Problem: Skin Integrity: Goal: Risk for impaired skin integrity will decrease Outcome: Progressing   

## 2020-01-22 NOTE — Plan of Care (Signed)
  Problem: Activity: Goal: Ability to avoid complications of mobility impairment will improve Outcome: Progressing Goal: Ability to tolerate increased activity will improve Outcome: Progressing   Problem: Education: Goal: Verbalization of understanding the information provided will improve Outcome: Progressing   Problem: Coping: Goal: Level of anxiety will decrease Outcome: Progressing   Problem: Physical Regulation: Goal: Postoperative complications will be avoided or minimized Outcome: Progressing   Problem: Respiratory: Goal: Ability to maintain a clear airway will improve Outcome: Progressing   Problem: Pain Management: Goal: Pain level will decrease Outcome: Progressing   Problem: Skin Integrity: Goal: Signs of wound healing will improve Outcome: Progressing   Problem: Tissue Perfusion: Goal: Ability to maintain adequate tissue perfusion will improve Outcome: Progressing   Problem: Education: Goal: Knowledge of General Education information will improve Description: Including pain rating scale, medication(s)/side effects and non-pharmacologic comfort measures Outcome: Progressing   Problem: Health Behavior/Discharge Planning: Goal: Ability to manage health-related needs will improve Outcome: Progressing   Problem: Clinical Measurements: Goal: Ability to maintain clinical measurements within normal limits will improve Outcome: Progressing Goal: Will remain free from infection Outcome: Progressing Goal: Diagnostic test results will improve Outcome: Progressing Goal: Respiratory complications will improve Outcome: Progressing Goal: Cardiovascular complication will be avoided Outcome: Progressing   Problem: Activity: Goal: Risk for activity intolerance will decrease Outcome: Progressing   Problem: Nutrition: Goal: Adequate nutrition will be maintained Outcome: Progressing   Problem: Coping: Goal: Level of anxiety will decrease Outcome: Progressing    Problem: Elimination: Goal: Will not experience complications related to bowel motility Outcome: Progressing Goal: Will not experience complications related to urinary retention Outcome: Progressing   Problem: Pain Managment: Goal: General experience of comfort will improve Outcome: Progressing   Problem: Safety: Goal: Ability to remain free from injury will improve Outcome: Progressing   Problem: Skin Integrity: Goal: Risk for impaired skin integrity will decrease Outcome: Progressing   

## 2020-01-23 LAB — SARS CORONAVIRUS 2 (TAT 6-24 HRS): SARS Coronavirus 2: NEGATIVE

## 2020-01-23 NOTE — Progress Notes (Signed)
PROGRESS NOTE    Kendra Hernandez  [id number] DOB: June 23, 1941 DOA: 01/18/2020 PCP: Seward Carol, MD   Brief Narrative:  HPI On 01/18/2020 by Dr. Fuller Plan Kendra Hernandez is a 79 y.o. female with medical history significant of hypertension, hyperlipidemia, COPD, and tobacco abuse.  She presents after having a witnessed fall with complaints of pain in her right hip, back, and right shoulder.  At baseline she ambulates with the use of prior to walker and had been using it when she was coming from lunch.  She reports that her feet got tangled up causing her to trip and fall.  She was unable to bear weight on her feet thereafter.  Denies any trauma to her head or lose consciousness.  Patient reports associated symptoms of shortness of breath, but she reports that this is chronic.  Normally she does not require oxygen at home and admits to smoking half pack cigarettes per day on average.  Interim history Patient admitted for right upper and lower pubic rami fractures as well as right scapular fracture secondary to fall.  Orthopedics consulted and recommended supportive management.  PT consulted and recommending SNF.  TOC consulted and pending SNF placement and repeat COVID.  Patient was also noted to be hypoxic on admission however this appears to have resolved. Assessment & Plan   Acute right upper and lower pubic rami fractures/right scapular fracture -Secondary to fall -Orthopedics consulted and appreciated, recommended supportive management and PT consult, weight-bear as tolerated -PT and OT consulted, recommending SNF -Continue pain control -TOC consulted for placement- hopefully to occur on 01/24/20 -patient continues to need SNF given that she is elderly with multiple fractures and continues to have pain and weakness which has limited her mobility.   Transient hypoxia -On admission, patient was noted to have oxygen saturations noted to be between 89 to 91% on room air -Chest x-ray  showed right scapular fracture -Suspect hypoxia was secondary to pain and atelectasis -Continue incentive spirometery -will wean oxygen  Essential hypertension -Continue labetalol as needed with holding parameters  COPD without exacerbation -Patient does have a history of tobacco abuse -Continue albuterol as needed  Dementia -Currently alert and oriented x3 -Continue Aricept  Hyperlipidemia -Continue statin  Tobacco abuse -Smoking cessation discussed  Insomnia -Continue Seroquel, trazodone  GERD -Continue PPI  DVT Prophylaxis  Lovenox  Code Status: Full  Family Communication: None at bedside  Disposition Plan: admitted, pending SNF placement on 1/25. Pending COVID test.  Consultants Orthopedic surgery  Procedures  none  Antibiotics   Anti-infectives (From admission, onward)   None      Subjective:   Kendra Hernandez seen and examined today.  Feels tired this morning. Complains of pain when she moves her right arm. Denies chest pain or shortness of breath.   Objective:   Vitals:   01/22/20 1400 01/22/20 1509 01/22/20 1957 01/23/20 0350  BP: 111/69 132/67 (!) 118/45 120/70  Pulse: 78 78 78 (!) 58  Resp:   17 14  Temp: 97.9 F (36.6 C) 97.7 F (36.5 C) 98.2 F (36.8 C) 98.3 F (36.8 C)  TempSrc: Axillary Oral    SpO2: 97% 96% 94% 94%  Weight:      Height:        Intake/Output Summary (Last 24 hours) at 01/23/2020 0800 Last data filed at 01/23/2020 0500 Gross per 24 hour  Intake 483 ml  Output 700 ml  Net -217 ml   Filed Weights   01/18/20 1323  Weight: 56.7 kg  Exam  General: Well developed, well nourished, NAD  HEENT: NCAT, mucous membranes moist.   Cardiovascular: S1 S2 auscultated, RRR, SEM  Respiratory: Clear to auscultation bilaterally with equal chest rise  Abdomen: Soft, nontender, nondistended, + bowel sounds  Extremities: warm dry without cyanosis clubbing or edema of LE. R shoulder/scapular edema  Neuro: AAOx2, hard of  hearing otherwise nonfocal  Psych: Appropriate mood and affect  Data Reviewed: I have personally reviewed following labs and imaging studies  CBC: Recent Labs  Lab 01/18/20 1413 01/20/20 0236 01/21/20 0515  WBC 7.0 5.5 5.7  NEUTROABS 4.7  --   --   HGB 14.1 12.6 12.4  HCT 43.1 38.5 37.5  MCV 90.7 90.0 89.7  PLT 157 132* 0000000*   Basic Metabolic Panel: Recent Labs  Lab 01/18/20 1413 01/19/20 0215 01/20/20 0236 01/21/20 0515  NA 139 136 136 135  K 4.0 3.7 3.5 3.9  CL 107 104 105 102  CO2 24 23 23 23   GLUCOSE 103* 99 115* 123*  BUN 12 12 19 21   CREATININE 0.66 0.58 0.66 0.60  CALCIUM 9.3 8.8* 8.8* 8.5*   GFR: Estimated Creatinine Clearance: 53.6 mL/min (by C-G formula based on SCr of 0.6 mg/dL). Liver Function Tests: Recent Labs  Lab 01/18/20 1413  AST 19  ALT 16  ALKPHOS 100  BILITOT 0.7  PROT 6.6  ALBUMIN 3.9   No results for input(s): LIPASE, AMYLASE in the last 168 hours. No results for input(s): AMMONIA in the last 168 hours. Coagulation Profile: No results for input(s): INR, PROTIME in the last 168 hours. Cardiac Enzymes: No results for input(s): CKTOTAL, CKMB, CKMBINDEX, TROPONINI in the last 168 hours. BNP (last 3 results) No results for input(s): PROBNP in the last 8760 hours. HbA1C: No results for input(s): HGBA1C in the last 72 hours. CBG: No results for input(s): GLUCAP in the last 168 hours. Lipid Profile: No results for input(s): CHOL, HDL, LDLCALC, TRIG, CHOLHDL, LDLDIRECT in the last 72 hours. Thyroid Function Tests: No results for input(s): TSH, T4TOTAL, FREET4, T3FREE, THYROIDAB in the last 72 hours. Anemia Panel: No results for input(s): VITAMINB12, FOLATE, FERRITIN, TIBC, IRON, RETICCTPCT in the last 72 hours. Urine analysis:    Component Value Date/Time   COLORURINE YELLOW 06/26/2017 2151   APPEARANCEUR HAZY (A) 06/26/2017 2151   LABSPEC 1.011 06/26/2017 2151   PHURINE 7.0 06/26/2017 2151   GLUCOSEU NEGATIVE 06/26/2017 2151    HGBUR NEGATIVE 06/26/2017 2151   [place] 06/26/2017 2151   KETONESUR 20 (A) 06/26/2017 2151   PROTEINUR NEGATIVE 06/26/2017 2151   NITRITE NEGATIVE 06/26/2017 2151   LEUKOCYTESUR NEGATIVE 06/26/2017 2151   Sepsis Labs: @LABRCNTIP (procalcitonin:4,lacticidven:4)  ) Recent Results (from the past 240 hour(s))  Respiratory Panel by RT PCR (Flu A&B, Covid) - Nasopharyngeal Swab     Status: None   Collection Time: 01/18/20  2:35 PM   Specimen: Nasopharyngeal Swab  Result Value Ref Range Status   SARS Coronavirus 2 by RT PCR NEGATIVE NEGATIVE Final    Comment: (NOTE) SARS-CoV-2 target nucleic acids are NOT DETECTED. The SARS-CoV-2 RNA is generally detectable in upper respiratoy specimens during the acute phase of infection. The lowest concentration of SARS-CoV-2 viral copies this assay can detect is 131 copies/mL. A negative result does not preclude SARS-Cov-2 infection and should not be used as the sole basis for treatment or other patient management decisions. A negative result may occur with  improper specimen collection/handling, submission of specimen other than nasopharyngeal swab, presence of viral mutation(s)  within the areas targeted by this assay, and inadequate number of viral copies (<131 copies/mL). A negative result must be combined with clinical observations, patient history, and epidemiological information. The expected result is Negative. Fact Sheet for Patients:  PinkCheek.be Fact Sheet for Healthcare Providers:  GravelBags.it This test is not yet ap proved or cleared by the [place] FDA and  has been authorized for detection and/or diagnosis of SARS-CoV-2 by FDA under an Emergency Use Authorization (EUA). This EUA will remain  in effect (meaning this test can be used) for the duration of the COVID-19 declaration under Section 564(b)(1) of the Act, 21 U.S.C. section 360bbb-3(b)(1), unless the  authorization is terminated or revoked sooner.    Influenza A by PCR NEGATIVE NEGATIVE Final   Influenza B by PCR NEGATIVE NEGATIVE Final    Comment: (NOTE) The Xpert Xpress SARS-CoV-2/FLU/RSV assay is intended as an aid in  the diagnosis of influenza from Nasopharyngeal swab specimens and  should not be used as a sole basis for treatment. Nasal washings and  aspirates are unacceptable for Xpert Xpress SARS-CoV-2/FLU/RSV  testing. Fact Sheet for Patients: PinkCheek.be Fact Sheet for Healthcare Providers: GravelBags.it This test is not yet approved or cleared by the [place] FDA and  has been authorized for detection and/or diagnosis of SARS-CoV-2 by  FDA under an Emergency Use Authorization (EUA). This EUA will remain  in effect (meaning this test can be used) for the duration of the  Covid-19 declaration under Section 564(b)(1) of the Act, 21  U.S.C. section 360bbb-3(b)(1), unless the authorization is  terminated or revoked. Performed at [facility], [place] [address] [place], [place] [address]       Radiology Studies: No results found.   Scheduled Meds: . atorvastatin  20 mg Oral Daily  . darifenacin  7.5 mg Oral Daily  . donepezil  10 mg Oral QPC supper  . enoxaparin (LOVENOX) injection  40 mg Subcutaneous Q24H  . labetalol  100 mg Oral BID  . loratadine  10 mg Oral Daily  . nicotine  21 mg Transdermal Daily  . pantoprazole  40 mg Oral Daily  . QUEtiapine  25 mg Oral Daily  . sodium chloride flush  3 mL Intravenous Q12H  . traZODone  200 mg Oral QHS  . venlafaxine XR  225 mg Oral Q breakfast   Continuous Infusions:   LOS: 4 days   Time Spent in minutes   30 minutes  Vangie Henthorn D.O. on 01/23/2020 at 8:00 AM  Between 7am to 7pm - Please see pager noted on [email or web address]  After 7pm go to [email or web address]  And look for the night coverage person covering for me after hours  [facility] Office  [phone]

## 2020-01-23 NOTE — Plan of Care (Signed)
  Problem: Activity: Goal: Ability to avoid complications of mobility impairment will improve Outcome: Progressing   Problem: Education: Goal: Verbalization of understanding the information provided will improve Outcome: Progressing   Problem: Respiratory: Goal: Ability to maintain a clear airway will improve Outcome: Progressing   Problem: Pain Management: Goal: Pain level will decrease Outcome: Progressing   Problem: Clinical Measurements: Goal: Will remain free from infection Outcome: Progressing Goal: Cardiovascular complication will be avoided Outcome: Progressing   Problem: Elimination: Goal: Will not experience complications related to bowel motility Outcome: Progressing   Problem: Pain Managment: Goal: General experience of comfort will improve Outcome: Progressing   Problem: Safety: Goal: Ability to remain free from injury will improve Outcome: Progressing   Problem: Skin Integrity: Goal: Risk for impaired skin integrity will decrease Outcome: Progressing

## 2020-01-23 NOTE — Plan of Care (Signed)
  Problem: Pain Managment: Goal: General experience of comfort will improve Outcome: Progressing   Problem: Safety: Goal: Ability to remain free from injury will improve Outcome: Progressing   Problem: Skin Integrity: Goal: Risk for impaired skin integrity will decrease Outcome: Progressing   

## 2020-01-24 ENCOUNTER — Other Ambulatory Visit: Payer: Self-pay | Admitting: Internal Medicine

## 2020-01-24 DIAGNOSIS — R41841 Cognitive communication deficit: Secondary | ICD-10-CM | POA: Diagnosis not present

## 2020-01-24 DIAGNOSIS — R2681 Unsteadiness on feet: Secondary | ICD-10-CM | POA: Diagnosis not present

## 2020-01-24 DIAGNOSIS — R4182 Altered mental status, unspecified: Secondary | ICD-10-CM | POA: Diagnosis not present

## 2020-01-24 DIAGNOSIS — S32591A Other specified fracture of right pubis, initial encounter for closed fracture: Secondary | ICD-10-CM | POA: Diagnosis not present

## 2020-01-24 DIAGNOSIS — F99 Mental disorder, not otherwise specified: Secondary | ICD-10-CM | POA: Diagnosis not present

## 2020-01-24 DIAGNOSIS — I10 Essential (primary) hypertension: Secondary | ICD-10-CM | POA: Diagnosis not present

## 2020-01-24 DIAGNOSIS — F329 Major depressive disorder, single episode, unspecified: Secondary | ICD-10-CM | POA: Diagnosis not present

## 2020-01-24 DIAGNOSIS — R52 Pain, unspecified: Secondary | ICD-10-CM | POA: Diagnosis not present

## 2020-01-24 DIAGNOSIS — I69828 Other speech and language deficits following other cerebrovascular disease: Secondary | ICD-10-CM | POA: Diagnosis not present

## 2020-01-24 DIAGNOSIS — F0281 Dementia in other diseases classified elsewhere with behavioral disturbance: Secondary | ICD-10-CM | POA: Diagnosis not present

## 2020-01-24 DIAGNOSIS — F0391 Unspecified dementia with behavioral disturbance: Secondary | ICD-10-CM | POA: Diagnosis not present

## 2020-01-24 DIAGNOSIS — Z7401 Bed confinement status: Secondary | ICD-10-CM | POA: Diagnosis not present

## 2020-01-24 DIAGNOSIS — Z9181 History of falling: Secondary | ICD-10-CM | POA: Diagnosis not present

## 2020-01-24 DIAGNOSIS — S42101A Fracture of unspecified part of scapula, right shoulder, initial encounter for closed fracture: Secondary | ICD-10-CM | POA: Diagnosis not present

## 2020-01-24 DIAGNOSIS — W19XXXA Unspecified fall, initial encounter: Secondary | ICD-10-CM | POA: Diagnosis not present

## 2020-01-24 DIAGNOSIS — J449 Chronic obstructive pulmonary disease, unspecified: Secondary | ICD-10-CM | POA: Diagnosis not present

## 2020-01-24 DIAGNOSIS — M2550 Pain in unspecified joint: Secondary | ICD-10-CM | POA: Diagnosis not present

## 2020-01-24 DIAGNOSIS — R102 Pelvic and perineal pain: Secondary | ICD-10-CM | POA: Diagnosis not present

## 2020-01-24 DIAGNOSIS — K219 Gastro-esophageal reflux disease without esophagitis: Secondary | ICD-10-CM | POA: Diagnosis not present

## 2020-01-24 DIAGNOSIS — I469 Cardiac arrest, cause unspecified: Secondary | ICD-10-CM | POA: Diagnosis not present

## 2020-01-24 DIAGNOSIS — J069 Acute upper respiratory infection, unspecified: Secondary | ICD-10-CM | POA: Diagnosis not present

## 2020-01-24 DIAGNOSIS — M25511 Pain in right shoulder: Secondary | ICD-10-CM | POA: Diagnosis not present

## 2020-01-24 DIAGNOSIS — F5105 Insomnia due to other mental disorder: Secondary | ICD-10-CM | POA: Diagnosis not present

## 2020-01-24 DIAGNOSIS — F0390 Unspecified dementia without behavioral disturbance: Secondary | ICD-10-CM | POA: Diagnosis not present

## 2020-01-24 DIAGNOSIS — S42101D Fracture of unspecified part of scapula, right shoulder, subsequent encounter for fracture with routine healing: Secondary | ICD-10-CM | POA: Diagnosis not present

## 2020-01-24 DIAGNOSIS — M6281 Muscle weakness (generalized): Secondary | ICD-10-CM | POA: Diagnosis not present

## 2020-01-24 DIAGNOSIS — E785 Hyperlipidemia, unspecified: Secondary | ICD-10-CM | POA: Diagnosis not present

## 2020-01-24 DIAGNOSIS — S32501D Unspecified fracture of right pubis, subsequent encounter for fracture with routine healing: Secondary | ICD-10-CM | POA: Diagnosis not present

## 2020-01-24 DIAGNOSIS — G4700 Insomnia, unspecified: Secondary | ICD-10-CM | POA: Diagnosis not present

## 2020-01-24 DIAGNOSIS — F332 Major depressive disorder, recurrent severe without psychotic features: Secondary | ICD-10-CM | POA: Diagnosis not present

## 2020-01-24 DIAGNOSIS — R413 Other amnesia: Secondary | ICD-10-CM | POA: Diagnosis not present

## 2020-01-24 DIAGNOSIS — Z23 Encounter for immunization: Secondary | ICD-10-CM | POA: Diagnosis not present

## 2020-01-24 DIAGNOSIS — R0902 Hypoxemia: Secondary | ICD-10-CM | POA: Diagnosis not present

## 2020-01-24 MED ORDER — NICOTINE 21 MG/24HR TD PT24: 21.0000 mg | MEDICATED_PATCH | Freq: Every day | TRANSDERMAL | 0 refills | Status: AC

## 2020-01-24 MED ORDER — TRAMADOL HCL 50 MG PO TABS: 50.0000 mg | ORAL_TABLET | Freq: Four times a day (QID) | ORAL | 0 refills | Status: DC | PRN

## 2020-01-24 MED ORDER — TRAMADOL HCL 50 MG PO TABS: 50.0000 mg | ORAL_TABLET | ORAL | 0 refills | Status: DC | PRN

## 2020-01-24 MED ORDER — LABETALOL HCL 100 MG PO TABS: 100.0000 mg | ORAL_TABLET | Freq: Two times a day (BID) | ORAL | Status: DC

## 2020-01-24 NOTE — Care Management Note (Addendum)
Spoke to [place] at [facility] patient come be transferred today.  Covid 01/23/20 negative.    Patient going to room 108. Nurse to call report to [phone]   Ashley at [facility] [employer] would like son to call her at [phone] to arrange a time to do paperwork. Message given to son.   Spoke to nurse, patient and son regarding above.   Nurse requesting PTAR to be arranged for 2 pm this afternoon.  Magdalen Spatz RN

## 2020-01-24 NOTE — Plan of Care (Addendum)
Pt discharging to [facility]. Attempted calling report at [phone], Network engineer took a message and stated they would give a call back. Discharge instructions explained to pt by Arley Phenix, RN and given to [facility] to pass on to facility. Pt verbalized understanding and denied any further questions or concerns. Packed all personal belongings.    Addendum: Received call back and gave report to [facility] 1400.   Problem: Activity: Goal: Ability to avoid complications of mobility impairment will improve 01/24/2020 1322 by Stevan Born, RN Outcome: Completed/Met 01/24/2020 1321 by Stevan Born, RN Reactivated Goal: Ability to tolerate increased activity will improve 01/24/2020 1322 by Michaelia Beilfuss, Curley Spice, RN Outcome: Completed/Met 01/24/2020 1321 by Stevan Born, RN Reactivated   Problem: Education: Goal: Verbalization of understanding the information provided will improve 01/24/2020 1322 by Stevan Born, RN Outcome: Completed/Met 01/24/2020 1321 by Stevan Born, RN Reactivated   Problem: Coping: Goal: Level of anxiety will decrease 01/24/2020 1322 by Stevan Born, RN Outcome: Completed/Met 01/24/2020 1321 by Stevan Born, RN Reactivated   Problem: Physical Regulation: Goal: Postoperative complications will be avoided or minimized 01/24/2020 1322 by Stevan Born, RN Outcome: Completed/Met 01/24/2020 1321 by Stevan Born, RN Reactivated   Problem: Respiratory: Goal: Ability to maintain a clear airway will improve 01/24/2020 1322 by Stevan Born, RN Outcome: Completed/Met 01/24/2020 1321 by Stevan Born, RN Reactivated   Problem: Pain Management: Goal: Pain level will decrease 01/24/2020 1322 by Stevan Born, RN Outcome: Completed/Met 01/24/2020 1321 by Stevan Born, RN Reactivated   Problem: Skin Integrity: Goal: Signs of wound healing will improve 01/24/2020 1322 by Stevan Born, RN Outcome: Completed/Met 01/24/2020 1321 by Stevan Born,  RN Reactivated   Problem: Tissue Perfusion: Goal: Ability to maintain adequate tissue perfusion will improve 01/24/2020 1322 by Stevan Born, RN Outcome: Completed/Met 01/24/2020 1321 by Stevan Born, RN Reactivated   Problem: Education: Goal: Knowledge of General Education information will improve Description: Including pain rating scale, medication(s)/side effects and non-pharmacologic comfort measures 01/24/2020 1322 by Stevan Born, RN Outcome: Completed/Met 01/24/2020 1321 by Stevan Born, RN Reactivated   Problem: Health Behavior/Discharge Planning: Goal: Ability to manage health-related needs will improve 01/24/2020 1322 by Stevan Born, RN Outcome: Completed/Met 01/24/2020 1321 by Stevan Born, RN Reactivated   Problem: Clinical Measurements: Goal: Ability to maintain clinical measurements within normal limits will improve 01/24/2020 1322 by Stevan Born, RN Outcome: Completed/Met 01/24/2020 1321 by Stevan Born, RN Reactivated Goal: Will remain free from infection 01/24/2020 1322 by Stevan Born, RN Outcome: Completed/Met 01/24/2020 1321 by Stevan Born, RN Reactivated Goal: Diagnostic test results will improve 01/24/2020 1322 by Stevan Born, RN Outcome: Completed/Met 01/24/2020 1321 by Stevan Born, RN Reactivated Goal: Respiratory complications will improve 01/24/2020 1322 by Stevan Born, RN Outcome: Completed/Met 01/24/2020 1321 by Stevan Born, RN Reactivated Goal: Cardiovascular complication will be avoided 01/24/2020 1322 by Stevan Born, RN Outcome: Completed/Met 01/24/2020 1321 by Stevan Born, RN Reactivated   Problem: Activity: Goal: Risk for activity intolerance will decrease 01/24/2020 1322 by Stevan Born, RN Outcome: Completed/Met 01/24/2020 1321 by Stevan Born, RN Reactivated   Problem: Nutrition: Goal: Adequate nutrition will be maintained 01/24/2020 1322 by Stevan Born,  RN Outcome: Completed/Met 01/24/2020 1321 by Stevan Born, RN Reactivated   Problem: Coping: Goal: Level of anxiety will decrease 01/24/2020 1322 by Stevan Born, RN Outcome: Completed/Met 01/24/2020 1321  by Stevan Born, RN Reactivated   Problem: Elimination: Goal: Will not experience complications related to bowel motility 01/24/2020 1322 by Stevan Born, RN Outcome: Completed/Met 01/24/2020 1321 by Stevan Born, RN Reactivated Goal: Will not experience complications related to urinary retention 01/24/2020 1322 by Retal Tonkinson, Curley Spice, RN Outcome: Completed/Met 01/24/2020 1321 by Stevan Born, RN Reactivated   Problem: Pain Managment: Goal: General experience of comfort will improve 01/24/2020 1322 by Stevan Born, RN Outcome: Completed/Met 01/24/2020 1321 by Stevan Born, RN Reactivated   Problem: Safety: Goal: Ability to remain free from injury will improve 01/24/2020 1322 by Stevan Born, RN Outcome: Completed/Met 01/24/2020 1321 by Stevan Born, RN Reactivated   Problem: Skin Integrity: Goal: Risk for impaired skin integrity will decrease 01/24/2020 1322 by Stevan Born, RN Outcome: Completed/Met 01/24/2020 1321 by Stevan Born, RN Reactivated

## 2020-01-24 NOTE — Discharge Summary (Signed)
Physician Discharge Summary  Kendra Hernandez [id number] DOB: 08/08/1941 DOA: 01/18/2020  PCP: Seward Carol, MD  Admit date: 01/18/2020 Discharge date: 01/24/2020  Time spent: 45 minutes  Recommendations for Outpatient Follow-up:  Patient will be discharged to skilled nursing facility. Continue physical and occupational therapy.  Patient will need to follow up with primary care provider within one week of discharge.  Follow up with Dr. Doran Durand, orthopedics, in 3 weeks. Patient should continue medications as prescribed.  Patient should follow a heart healthy diet.    Discharge Diagnoses:  Acute right upper and lower pubic rami fractures/right scapular fracture Transient hypoxia Essential hypertension COPD without exacerbation Dementia Hyperlipidemia Tobacco abuse Insomnia GERD  Discharge Condition: Stable  Diet recommendation: heart healthy  Filed Weights   01/18/20 1323  Weight: 56.7 kg    History of present illness:  On 01/18/2020 by Dr. Joen Laura a 79 y.o.femalewith medical history significant ofhypertension, hyperlipidemia, COPD,andtobacco abuse.She presents after having a witnessed fall with complaints of pain in her right hip, back, and right shoulder. At baseline she ambulates with the use of prior to walker and had been using it when she was coming from lunch. She reports that her feet got tangled up causing her to trip and fall.She was unable to bear weight on her feet thereafter. Denies any trauma to her head or lose consciousness. Patient reports associated symptoms of shortness of breath, but she reports that this is chronic. Normally she does not require oxygen at homeand admits to smoking half pack cigarettes per day on average.  Hospital Course:  Acute right upper and lower pubic rami fractures/right scapular fracture -Secondary to fall -Orthopedics consulted and appreciated, recommended supportive management and PT consult,  weight-bear as tolerated -PT and OT consulted, recommending SNF -Continue pain control -TOC consulted for placement- hopefully to occur on 01/24/20 -patient continues to need SNF given that she is elderly with multiple fractures and continues to have pain and weakness which has limited her mobility.  -Patient to follow up with Dr. Doran Durand in 3 weeks  Transient hypoxia -On admission, patient was noted to have oxygen saturations noted to be between 89 to 91% on room air -Chest x-ray showed right scapular fracture -Suspect hypoxia was secondary to pain and atelectasis -Continue incentive spirometery -COVID negative x2 -Currently maintaining oxygen saturations on room air in the mid 90s  Essential hypertension -Continue labetalol as needed with holding parameters  COPD without exacerbation -Patient does have a history of tobacco abuse -Continue albuterol as needed  Dementia -Currently alert and oriented x3 -Continue Aricept  Hyperlipidemia -Continue statin  Tobacco abuse -Smoking cessation discussed  Insomnia -Continue Seroquel, trazodone  GERD -Continue PPI  Procedures: None  Consultations: Orthopedic surgery  Discharge Exam: Vitals:   01/24/20 0943 01/24/20 0954  BP: (!) 140/53   Pulse: 72 67  Resp:    Temp:    SpO2:  95%     General: Well developed, elderly, NAD  HEENT: NCAT, mucous membranes moist.  Cardiovascular: S1 S2 auscultated, RRR, SEM  Respiratory: Clear to auscultation bilaterally  Abdomen: Soft, nontender, nondistended, + bowel sounds  Extremities: warm dry without cyanosis clubbing or edema of LE. R shoulder/scapular edema improving   Neuro: AAOx3, nonfocal  Psych: Appropriate mood and affect  Discharge Instructions Discharge Instructions    Discharge instructions   Complete by: As directed    Patient will be discharged to skilled nursing facility. Continue physical and occupational therapy.  Patient will need to follow up  with  primary care provider within one week of discharge.  Follow up with Dr. Doran Durand, orthopedics, in 3 weeks. Patient should continue medications as prescribed.  Patient should follow a heart healthy diet.     Allergies as of 01/24/2020      Reactions   Penicillins Hives, Other (See Comments)   Hives and swelling as a child Has patient had a PCN reaction causing immediate rash, facial/tongue/throat swelling, SOB or lightheadedness with hypotension: Yes Has patient had a PCN reaction causing severe rash involving mucus membranes or skin necrosis: No Has patient had a PCN reaction that required hospitalization No Has patient had a PCN reaction occurring within the last 10 years: No If all of the above answers are "NO", then may proceed with Cephalosporin use.   Ceftin [cefuroxime Axetil] Other (See Comments)   In Epic since 2017, so leaving this (allergy not noted on MAR in 2021??)   Simvastatin Other (See Comments)   In Epic since 2017, so leaving this (allergy not noted on MAR in 2021??)      Medication List    TAKE these medications   acetaminophen 500 MG tablet Commonly known as: TYLENOL Take 1,000 mg by mouth See admin instructions. Take 1,000 mg by mouth two times a day and 1,000 mg every 8 hours as needed for pain   amLODipine 10 MG tablet Commonly known as: NORVASC Take 10 mg by mouth daily.   atorvastatin 20 MG tablet Commonly known as: LIPITOR Take 20 mg by mouth daily.   donepezil 10 MG tablet Commonly known as: ARICEPT Take 1 tablet (10 mg total) daily by mouth. After meal What changed:   when to take this  additional instructions   hydrOXYzine 50 MG tablet Commonly known as: ATARAX/VISTARIL Take 50 mg by mouth every 12 (twelve) hours as needed for anxiety.   labetalol 100 MG tablet Commonly known as: NORMODYNE Take 100 mg by mouth 2 (two) times daily. What changed: Another medication with the same name was changed. Make sure you understand how and when to  take each.   labetalol 100 MG tablet Commonly known as: NORMODYNE Take 1 tablet (100 mg total) by mouth 2 (two) times daily. What changed: medication strength   loperamide 2 MG capsule Commonly known as: IMODIUM Take 2 mg by mouth daily as needed (for diarrhea).   loratadine 10 MG tablet Commonly known as: CLARITIN Take 10 mg by mouth daily.   nicotine 21 mg/24hr patch Commonly known as: NICODERM CQ - dosed in mg/24 hours Place 1 patch (21 mg total) onto the skin daily.   omeprazole 40 MG capsule Commonly known as: PRILOSEC Take 40 mg by mouth daily before breakfast.   QUEtiapine 25 MG tablet Commonly known as: SEROQUEL Take 25 mg by mouth every evening.   sodium chloride 0.65 % Soln nasal spray Commonly known as: OCEAN Place 2 sprays into both nostrils daily as needed (for dryness).   traMADol 50 MG tablet Commonly known as: ULTRAM Take 1 tablet (50 mg total) by mouth every 4 (four) hours as needed (for breakthrough pain).   traZODone 100 MG tablet Commonly known as: DESYREL Take 200 mg by mouth at bedtime.   trolamine salicylate 10 % cream Commonly known as: ASPERCREME Apply 1 application topically See admin instructions. Apply to both hands at bedtime   Trospium Chloride 60 MG Cp24 Take 60 mg by mouth daily.   Venlafaxine HCl 75 MG Tb24 Take 225 mg by mouth every morning.  Allergies  Allergen Reactions  . Penicillins Hives and Other (See Comments)    Hives and swelling as a child Has patient had a PCN reaction causing immediate rash, facial/tongue/throat swelling, SOB or lightheadedness with hypotension: Yes Has patient had a PCN reaction causing severe rash involving mucus membranes or skin necrosis: No Has patient had a PCN reaction that required hospitalization No Has patient had a PCN reaction occurring within the last 10 years: No If all of the above answers are "NO", then may proceed with Cephalosporin use.   . Ceftin [Cefuroxime Axetil] Other  (See Comments)    In Epic since 2017, so leaving this (allergy not noted on MAR in 2021??)  . Simvastatin Other (See Comments)    In Epic since 2017, so leaving this (allergy not noted on MAR in 2021??)   Follow-up Information    Wylene Simmer, MD. Schedule an appointment as soon as possible for a visit in 3 week(s).   Specialty: Orthopedic Surgery Contact information: [address] [place] [address] [phone]        Seward Carol, MD. Schedule an appointment as soon as possible for a visit in 1 week(s).   Specialty: Internal Medicine Why: Hospital follow up Contact information: [address] [place] [address] [phone]            The results of significant diagnostics from this hospitalization (including imaging, microbiology, ancillary and laboratory) are listed below for reference.    Significant Diagnostic Studies: DG Chest 1 View  Result Date: 01/18/2020 CLINICAL DATA:  Fall, back pain, shortness of breath EXAM: CHEST  1 VIEW COMPARISON:  06/18/2017 FINDINGS: The heart size and mediastinal contours are within normal limits. Opacity with bubbly internal contents at the medial right lung base compatible with known moderate-sized hiatal hernia. Both lungs are otherwise clear. No pleural effusion or pneumothorax. Abnormal contour of the inferior aspect of the right glenoid neck concerning for fracture. IMPRESSION: 1. No active cardiopulmonary disease. 2. Abnormal cortical contour of the right scapula at the inferior glenoid neck concerning for fracture. 3. Hiatal hernia. Electronically Signed   By: Davina Poke D.O.   On: 01/18/2020 15:47   DG Shoulder Right  Result Date: 01/18/2020 CLINICAL DATA:  Shoulder pain after fall EXAM: RIGHT SHOULDER - 2+ VIEW COMPARISON:  07/16/2016 FINDINGS: Cortical irregularity with subtle angulation of the right scapula at the inferior glenoid neck, which was slightly more conspicuous on the  frontal chest radiograph which was obtained concurrently. Glenohumeral joint is intact without fracture or dislocation. AC joint is intact. Soft tissues appear within normal limits. IMPRESSION: Cortical irregularity with subtle angulation of the right scapula at the inferior glenoid neck, which raises suspicion for a nondisplaced fracture. If further evaluation of this area is clinically warranted, CT can be performed. Electronically Signed   By: Davina Poke D.O.   On: 01/18/2020 15:51   CT Cervical Spine Wo Contrast  Result Date: 01/18/2020 CLINICAL DATA:  79 year old female with trauma. EXAM: CT CERVICAL SPINE WITHOUT CONTRAST TECHNIQUE: Multidetector CT imaging of the cervical spine was performed without intravenous contrast. Multiplanar CT image reconstructions were also generated. COMPARISON:  CT of the cervical spine dated 08/08/2018. FINDINGS: Evaluation of this exam is limited due to motion artifact. Alignment: No acute subluxation.  Grade 1 C4-C5 anterolisthesis. Skull base and vertebrae: No acute fracture.  Osteopenia. Soft tissues and spinal canal: No prevertebral fluid or swelling. No visible canal hematoma. Disc levels: Multilevel degenerative changes with  endplate irregularity and disc space narrowing. There is multilevel facet arthropathy Upper chest: Mild emphysema. Other: Bilateral carotid bulb calcified plaques. IMPRESSION: 1. No definite acute/traumatic cervical spine pathology. 2. Multiple degenerative changes. Electronically Signed   By: Anner Crete M.D.   On: 01/18/2020 15:37   DG Hip Unilat With Pelvis 2-3 Views Right  Result Date: 01/18/2020 CLINICAL DATA:  Fall, right hip pain EXAM: DG HIP (WITH OR WITHOUT PELVIS) 2-3V RIGHT COMPARISON:  CT 10/25/2016 FINDINGS: Mildly displaced fractures of the right superior and inferior pubic rami, which are new from prior. Pubic symphysis is intact without diastasis. SI joints are intact. Right hip joint is intact without fracture or  dislocation. Bones are demineralized. Vascular calcifications. IMPRESSION: Mildly displaced fractures of the right superior and inferior pubic rami, which are new from prior. Electronically Signed   By: Davina Poke D.O.   On: 01/18/2020 15:42    Microbiology: Recent Results (from the past 240 hour(s))  Respiratory Panel by RT PCR (Flu A&B, Covid) - Nasopharyngeal Swab     Status: None   Collection Time: 01/18/20  2:35 PM   Specimen: Nasopharyngeal Swab  Result Value Ref Range Status   SARS Coronavirus 2 by RT PCR NEGATIVE NEGATIVE Final    Comment: (NOTE) SARS-CoV-2 target nucleic acids are NOT DETECTED. The SARS-CoV-2 RNA is generally detectable in upper respiratoy specimens during the acute phase of infection. The lowest concentration of SARS-CoV-2 viral copies this assay can detect is 131 copies/mL. A negative result does not preclude SARS-Cov-2 infection and should not be used as the sole basis for treatment or other patient management decisions. A negative result may occur with  improper specimen collection/handling, submission of specimen other than nasopharyngeal swab, presence of viral mutation(s) within the areas targeted by this assay, and inadequate number of viral copies (<131 copies/mL). A negative result must be combined with clinical observations, patient history, and epidemiological information. The expected result is Negative. Fact Sheet for Patients:  PinkCheek.be Fact Sheet for Healthcare Providers:  GravelBags.it This test is not yet ap proved or cleared by the [place] FDA and  has been authorized for detection and/or diagnosis of SARS-CoV-2 by FDA under an Emergency Use Authorization (EUA). This EUA will remain  in effect (meaning this test can be used) for the duration of the COVID-19 declaration under Section 564(b)(1) of the Act, 21 U.S.C. section 360bbb-3(b)(1), unless the authorization is  terminated or revoked sooner.    Influenza A by PCR NEGATIVE NEGATIVE Final   Influenza B by PCR NEGATIVE NEGATIVE Final    Comment: (NOTE) The Xpert Xpress SARS-CoV-2/FLU/RSV assay is intended as an aid in  the diagnosis of influenza from Nasopharyngeal swab specimens and  should not be used as a sole basis for treatment. Nasal washings and  aspirates are unacceptable for Xpert Xpress SARS-CoV-2/FLU/RSV  testing. Fact Sheet for Patients: PinkCheek.be Fact Sheet for Healthcare Providers: GravelBags.it This test is not yet approved or cleared by the [place] FDA and  has been authorized for detection and/or diagnosis of SARS-CoV-2 by  FDA under an Emergency Use Authorization (EUA). This EUA will remain  in effect (meaning this test can be used) for the duration of the  Covid-19 declaration under Section 564(b)(1) of the Act, 21  U.S.C. section 360bbb-3(b)(1), unless the authorization is  terminated or revoked. Performed at [facility], [place] [address] [place], [place] [address]   SARS CORONAVIRUS 2 (TAT 6-24 HRS) Nasopharyngeal Nasopharyngeal Swab  Status: None   Collection Time: 01/23/20  5:28 AM   Specimen: Nasopharyngeal Swab  Result Value Ref Range Status   SARS Coronavirus 2 NEGATIVE NEGATIVE Final    Comment: (NOTE) SARS-CoV-2 target nucleic acids are NOT DETECTED. The SARS-CoV-2 RNA is generally detectable in upper and lower respiratory specimens during the acute phase of infection. Negative results do not preclude SARS-CoV-2 infection, do not rule out co-infections with other pathogens, and should not be used as the sole basis for treatment or other patient management decisions. Negative results must be combined with clinical observations, patient history, and epidemiological information. The expected result is Negative. Fact Sheet for Patients: SugarRoll.be Fact Sheet  for Healthcare Providers: https://www.woods-mathews.com/ This test is not yet approved or cleared by the [place] FDA and  has been authorized for detection and/or diagnosis of SARS-CoV-2 by FDA under an Emergency Use Authorization (EUA). This EUA will remain  in effect (meaning this test can be used) for the duration of the COVID-19 declaration under Section 56 4(b)(1) of the Act, 21 U.S.C. section 360bbb-3(b)(1), unless the authorization is terminated or revoked sooner. Performed at [facility], [place] [address], [place], [place] [address]      Labs: Basic Metabolic Panel: Recent Labs  Lab 01/18/20 1413 01/19/20 0215 01/20/20 0236 01/21/20 0515  NA 139 136 136 135  K 4.0 3.7 3.5 3.9  CL 107 104 105 102  CO2 24 23 23 23   GLUCOSE 103* 99 115* 123*  BUN 12 12 19 21   CREATININE 0.66 0.58 0.66 0.60  CALCIUM 9.3 8.8* 8.8* 8.5*   Liver Function Tests: Recent Labs  Lab 01/18/20 1413  AST 19  ALT 16  ALKPHOS 100  BILITOT 0.7  PROT 6.6  ALBUMIN 3.9   No results for input(s): LIPASE, AMYLASE in the last 168 hours. No results for input(s): AMMONIA in the last 168 hours. CBC: Recent Labs  Lab 01/18/20 1413 01/20/20 0236 01/21/20 0515  WBC 7.0 5.5 5.7  NEUTROABS 4.7  --   --   HGB 14.1 12.6 12.4  HCT 43.1 38.5 37.5  MCV 90.7 90.0 89.7  PLT 157 132* 125*   Cardiac Enzymes: No results for input(s): CKTOTAL, CKMB, CKMBINDEX, TROPONINI in the last 168 hours. BNP: BNP (last 3 results) Recent Labs    01/18/20 1413  BNP 176.7*    ProBNP (last 3 results) No results for input(s): PROBNP in the last 8760 hours.  CBG: No results for input(s): GLUCAP in the last 168 hours.     Signed:  Cristal Ford  [facility] 01/24/2020, 10:02 AM

## 2020-01-24 NOTE — Discharge Instructions (Signed)
Scapular Fracture  A scapular fracture is a break in the large, triangular bone behind your shoulder (shoulder blade or scapula). This bone makes up the socket joint of your shoulder. The scapula is well protected by muscles, so scapular fractures are unusual injuries. They often involve a lot of force. People who have a scapular fracture often have other injuries as well. These may be injuries to the lung, spine, head, shoulder, or ribs. What are the causes? Common causes of this condition include:  A fall from a great height.  A car or motorcycle accident.  A heavy, direct blow to the scapula. What are the signs or symptoms? The main symptom of a scapular fracture is severe pain when you try to move your arm. Other signs and symptoms include:  Swelling behind the shoulder.  Bruising.  Holding the arm still and close to the body. How is this diagnosed? This condition may be diagnosed based on:  Your symptoms and the details of a recent injury.  A physical exam.  X-ray or CT scan to confirm the diagnosis and to check for other injuries. How is this treated? This condition may be treated with:  Immobilization. Your arm is put in a sling. A support bandage may be wrapped around your chest. The health care provider will explain how to move your shoulder for the first week after your injury in order to prevent pain and stiffness. The sling can be removed as your movement increases and your pain decreases.  Physical therapy. A physical therapist will teach you exercises to stretch and strengthen your shoulder. The goal is to keep your shoulder from getting stiff or frozen. You may need to do these exercises for 6-12 months.  Surgery. You may need surgery if the bone pieces are out of place (displaced fracture). You may also need surgery if the fracture causes the bone to be deformed. In this case, the broken scapula will be put back into position and held in place with a surgical plate  and screws. Surgery is rarely done for this condition. Follow these instructions at home:  Medicines  Take over-the-counter and prescription medicines only as told by your health care provider.  Do not drive or use heavy machinery while taking prescription pain medicine. If you have a splint and a wrap:  Wear the splint and the wrap as told by your health care provider. Remove them only as told by your health care provider.  Loosen them if your fingers or toes tingle, become numb, or turn cold and blue.  Keep them clean.  If they are not waterproof: ? Do not let them get wet. ? Cover them with a watertight covering when you take a bath or a shower. Managing pain, stiffness, and swelling  Apply ice to the back of your shoulder: ? If you have a removable splint or wrap, remove it as told by your health care provider. ? Put ice in a plastic bag. ? Place a towel between your skin and the bag. ? Leave the ice on for 20 minutes, 2-3 times per day.  Do not lift anything that is heavier than 10 lbs. (4.5 kg), or the limit that your health care provider tells you, until he or she says that it is safe.  Avoid activities that make your symptoms worse for 4-6 weeks, or as long as directed. General instructions  Ask your health care provider when it is safe for you to drive.  Do not use any products  that contain nicotine or tobacco, such as cigarettes and e-cigarettes. These can delay bone healing. If you need help quitting, ask your health care provider.  Drink enough fluid to keep your urine pale yellow.  Do physical therapy exercises as told by your health care provider.  Return to your normal activities as told by your health care provider. Ask your health care provider what activities are safe for you.  Keep all follow-up visits as told by your health care provider. This is important. Contact a health care provider if:  You have pain that is not relieved by medicine.  You are  unable to do your physical therapy because of pain or stiffness. Get help right away if:  You are short of breath.  You cough up blood.  You cannot move your arm or your fingers. Summary  A scapular fracture is a break in the large, triangular bone behind your shoulder (shoulder blade or scapula).  The scapula is well protected by muscles, so scapular fractures are unusual injuries. They often involve a lot of force.  The main symptom of a scapular fracture is severe pain when you try to move your arm.  Immobilization, physical therapy, and surgery are used to treat this injury. Surgery is rarely done.  Follow your health care provider's instructions on taking medicines, using a wrap and splint, putting ice on the injured area, and resting from regular activities. This information is not intended to replace advice given to you by your health care provider. Make sure you discuss any questions you have with your health care provider. Document Revised: 02/27/2018 Document Reviewed: 01/27/2018 Elsevier Patient Education  2020 [employer].

## 2020-01-25 ENCOUNTER — Encounter: Payer: Self-pay | Admitting: Internal Medicine

## 2020-01-25 ENCOUNTER — Non-Acute Institutional Stay (SKILLED_NURSING_FACILITY): Payer: Medicare Other | Admitting: Internal Medicine

## 2020-01-25 DIAGNOSIS — S32591A Other specified fracture of right pubis, initial encounter for closed fracture: Secondary | ICD-10-CM | POA: Diagnosis not present

## 2020-01-25 DIAGNOSIS — I10 Essential (primary) hypertension: Secondary | ICD-10-CM

## 2020-01-25 DIAGNOSIS — F329 Major depressive disorder, single episode, unspecified: Secondary | ICD-10-CM | POA: Diagnosis not present

## 2020-01-25 DIAGNOSIS — F0391 Unspecified dementia with behavioral disturbance: Secondary | ICD-10-CM

## 2020-01-25 DIAGNOSIS — F5105 Insomnia due to other mental disorder: Secondary | ICD-10-CM

## 2020-01-25 DIAGNOSIS — K219 Gastro-esophageal reflux disease without esophagitis: Secondary | ICD-10-CM

## 2020-01-25 DIAGNOSIS — E785 Hyperlipidemia, unspecified: Secondary | ICD-10-CM | POA: Diagnosis not present

## 2020-01-25 DIAGNOSIS — F99 Mental disorder, not otherwise specified: Secondary | ICD-10-CM | POA: Diagnosis not present

## 2020-01-25 DIAGNOSIS — F332 Major depressive disorder, recurrent severe without psychotic features: Secondary | ICD-10-CM

## 2020-01-25 DIAGNOSIS — S42101A Fracture of unspecified part of scapula, right shoulder, initial encounter for closed fracture: Secondary | ICD-10-CM | POA: Diagnosis not present

## 2020-01-25 NOTE — Progress Notes (Signed)
: Provider:  Noah Delaine. Sheppard Coil, MD Location:  [place] Room Number: [id number] Place of Service:  SNF [phone])  PCP: Seward Carol, MD Patient Care Team: Seward Carol, MD as PCP - General  Extended Emergency Contact Information Primary Emergency Contact: Caton,Gene Address: [address]          [place], [place] [address] [place] Phone: [phone] Mobile Phone: [phone] Relation: Son     Allergies: Penicillins, Ceftin [cefuroxime axetil], and Simvastatin  Chief Complaint  Patient presents with  . New Admit To SNF    Admit to SNF    HPI: Patient is 79 y.o. female with hypertension, hyperlipidemia, COPD, tobacco abuse, who presented to [facility] after witnessed fall with complaints of right hip back and right shoulder pain.  At baseline she ambulates with the use of walker.  She reportedly got tangled causing her to trip and fall.  She was unable to bear weight after her fall.  Denies trauma to her head or loss of consciousness and patient is chronically short of breath.  Echocardiogram [facility] from 1/9-25 where she was found to have acute right upper and lower pubic rami fractures and a right scapula fracture.  Patient was seen by orthopedics who recommended supportive management and physical therapy.  During hospitalization patient did have transient hypoxia that was felt to be secondary to pain and atelectasis.  Patient is admitted to skilled nursing facility for OT/PT.  Was collection facility patient will be followed for hypertension treated with labetalol, dementia treated Aricept, and hyperlipidemia treated with Lipitor.  Past Medical History:  Diagnosis Date  . Anxiety   . Arthritis   . COPD (chronic obstructive pulmonary disease) ([place])   . Hyperlipidemia   . Hypertension   . Hypothyroid   . Insomnia   . Reflux   . Repeated falls   . Skin cancer   . Stress   . Stroke (cerebrum) [facility])      Past Surgical History:  Procedure Laterality Date  . COLONOSCOPY     With polyp removal   . MANDIBLE FRACTURE SURGERY     Placement of partial jaw due to abcess  . SKIN CANCER EXCISION     Removal of several skin cancers  . TOE SURGERY Right    Removal of toe     Allergies as of 01/25/2020      Reactions   Penicillins Hives, Other (See Comments)   Hives and swelling as a child Has patient had a PCN reaction causing immediate rash, facial/tongue/throat swelling, SOB or lightheadedness with hypotension: Yes Has patient had a PCN reaction causing severe rash involving mucus membranes or skin necrosis: No Has patient had a PCN reaction that required hospitalization No Has patient had a PCN reaction occurring within the last 10 years: No If all of the above answers are "NO", then may proceed with Cephalosporin use.   Ceftin [cefuroxime Axetil] Other (See Comments)   In Epic since 2017, so leaving this (allergy not noted on MAR in 2021??)   Simvastatin Other (See Comments)   In Epic since 2017, so leaving this (allergy not noted on MAR in 2021??)      Medication List       Accurate as of January 25, 2020  9:56 AM. If you have any questions, ask your nurse or doctor.        acetaminophen 500 MG tablet Commonly known as: TYLENOL Take 1,000 mg  by mouth See admin instructions. Take 1,000 mg by mouth two times a day and 1,000 mg every 8 hours as needed for pain   amLODipine 10 MG tablet Commonly known as: NORVASC Take 10 mg by mouth daily.   atorvastatin 20 MG tablet Commonly known as: LIPITOR Take 20 mg by mouth daily.   donepezil 10 MG tablet Commonly known as: ARICEPT Take 1 tablet (10 mg total) daily by mouth. After meal   hydrOXYzine 50 MG tablet Commonly known as: ATARAX/VISTARIL Take 50 mg by mouth every 12 (twelve) hours as needed for anxiety.   labetalol 100 MG tablet Commonly known as: NORMODYNE Take 100 mg by mouth 2 (two) times daily.   labetalol 100 MG  tablet Commonly known as: NORMODYNE Take 1 tablet (100 mg total) by mouth 2 (two) times daily.   loperamide 2 MG capsule Commonly known as: IMODIUM Take 2 mg by mouth daily as needed (for diarrhea).   loratadine 10 MG tablet Commonly known as: CLARITIN Take 10 mg by mouth daily.   nicotine 21 mg/24hr patch Commonly known as: NICODERM CQ - dosed in mg/24 hours Place 1 patch (21 mg total) onto the skin daily.   omeprazole 40 MG capsule Commonly known as: PRILOSEC Take 40 mg by mouth daily before breakfast.   QUEtiapine 25 MG tablet Commonly known as: SEROQUEL Take 25 mg by mouth every evening.   sodium chloride 0.65 % Soln nasal spray Commonly known as: OCEAN Place 2 sprays into both nostrils daily as needed (for dryness).   traMADol 50 MG tablet Commonly known as: ULTRAM Take 1 tablet (50 mg total) by mouth every 6 (six) hours as needed (for breakthrough pain).   traZODone 100 MG tablet Commonly known as: DESYREL Take 200 mg by mouth at bedtime.   trolamine salicylate 10 % cream Commonly known as: ASPERCREME Apply 1 application topically See admin instructions. Apply to both hands at bedtime   Trospium Chloride 60 MG Cp24 Take 60 mg by mouth daily.   Venlafaxine HCl 75 MG Tb24 Take 225 mg by mouth every morning.       No orders of the defined types were placed in this encounter.   Immunization History  Administered Date(s) Administered  . Influenza, High Dose Seasonal PF 10/23/2017  . Influenza-Unspecified 10/31/2015  . PPD Test 01/04/2016, 01/18/2016  . Pneumococcal-Unspecified 01/11/2016  . Td 07/29/2014    Social History   Tobacco Use  . Smoking status: Current Every Day Smoker    Packs/day: 2.00    Years: 30.00    Pack years: 60.00    Types: Cigarettes  . Smokeless tobacco: Never Used  Substance Use Topics  . Alcohol use: Not Currently    Alcohol/week: 0.0 standard drinks    Comment: last drink 2 wks ago 11-05-16, but is uncontrollable.     Family history is   Family History  Problem Relation Age of Onset  . Emphysema Mother   . Rheumatologic disease Mother   . Heart attack Father   . Emphysema Brother   . Pancreatic cancer Daughter       Review of Systems  GENERAL:  no fevers, fatigue, appetite changes SKIN: No itching, or rash EYES: No eye pain, redness, discharge EARS: No earache, tinnitus, change in hearing NOSE: No congestion, drainage or bleeding  MOUTH/THROAT: No mouth or tooth pain, No sore throat RESPIRATORY: No cough, wheezing, SOB CARDIAC: No chest pain, palpitations, lower extremity edema  GI: No abdominal pain, No N/V/D or constipation,  No heartburn or reflux  GU: No dysuria, frequency or urgency, or incontinence  MUSCULOSKELETAL: No unrelieved bone/joint pain NEUROLOGIC: No headache, dizziness or focal weakness PSYCHIATRIC: No c/o anxiety or sadness   Vitals:   01/25/20 0949  BP: (!) 142/74  Pulse: 64  Resp: 16  Temp: (!) 97 F (36.1 C)  SpO2: 94%    SpO2 Readings from Last 1 Encounters:  01/25/20 94%   Body mass index is 20.8 kg/m.     Physical Exam  GENERAL APPEARANCE: Alert, conversant, appears reasonably comfortable.  SKIN: No diaphoresis rash HEAD: Normocephalic, atraumatic  EYES: Conjunctiva/lids clear. Pupils round, reactive. EOMs intact.  EARS: External exam WNL, canals clear. Hearing grossly normal.  NOSE: No deformity or discharge.  MOUTH/THROAT: Lips w/o lesions  RESPIRATORY: Breathing is even, unlabored. Lung sounds are clear   CARDIOVASCULAR: Heart RRR no murmurs, rubs or gallops. No peripheral edema.   GASTROINTESTINAL: Abdomen is soft, non-tender, not distended w/ normal bowel sounds. GENITOURINARY: Bladder non tender, not distended  MUSCULOSKELETAL: Bruising and tenderness over right scapula NEUROLOGIC:  Cranial nerves 2-12 grossly intact. Moves all extremities  PSYCHIATRIC: Mood and affect appropriate to situation, no behavioral issues  Patient Active Problem  List   Diagnosis Date Noted  . Multiple closed stable fractures of pubic ramus ([place]) 01/18/2020  . Hypoxia 01/18/2020  . Closed right scapular fracture 01/18/2020  . COPD (chronic obstructive pulmonary disease) ([place]) 01/18/2020  . Dementia with behavioral disturbance ([place]) 11/11/2017  . Chronic ischemic right MCA stroke 11/11/2017  . Alcoholism, chronic ([place]) 06/27/2017  . Isopropyl alcohol poisoning   . Major depressive disorder, recurrent episode ([place]) 12/21/2016  . Major depressive disorder, recurrent episode, severe ([place]) 11/24/2016  . Alcohol withdrawal ([place]) 07/17/2016  . Withdrawal symptoms, alcohol ([place]) 07/17/2016  . Hypothyroidism 07/17/2016  . History of CVA (cerebrovascular accident) 07/17/2016  . Head contusion 07/17/2016  . Fall 07/17/2016  . Insomnia 07/17/2016  . Essential hypertension 07/17/2016  . Abnormality of gait 01/24/2016  . Memory loss 01/24/2016  . Acute right MCA stroke ([place]) 01/09/2016  . Essential hypertension, benign 01/09/2016  . Hyperlipidemia LDL goal <100 01/09/2016  . Other specified hypothyroidism 01/09/2016  . Cervicalgia 01/09/2016  . Esophageal reflux 01/09/2016  . Major depression, chronic 01/09/2016      Labs reviewed: Basic Metabolic Panel:    Component Value Date/Time   NA 135 01/21/2020 0515   NA 139 01/02/2016 0000   K 3.9 01/21/2020 0515   CL 102 01/21/2020 0515   CO2 23 01/21/2020 0515   GLUCOSE 123 (H) 01/21/2020 0515   BUN 21 01/21/2020 0515   BUN 12 01/02/2016 0000   CREATININE 0.60 01/21/2020 0515   CALCIUM 8.5 (L) 01/21/2020 0515   PROT 6.6 01/18/2020 1413   ALBUMIN 3.9 01/18/2020 1413   AST 19 01/18/2020 1413   ALT 16 01/18/2020 1413   ALKPHOS 100 01/18/2020 1413   BILITOT 0.7 01/18/2020 1413   GFRNONAA >60 01/21/2020 0515   GFRAA >60 01/21/2020 0515    Recent Labs    01/19/20 0215 01/20/20 0236 01/21/20 0515  NA 136 136 135  K 3.7 3.5 3.9  CL 104 105 102  CO2 23 23 23   GLUCOSE 99 115* 123*  BUN 12  19 21   CREATININE 0.58 0.66 0.60  CALCIUM 8.8* 8.8* 8.5*   Liver Function Tests: Recent Labs    01/18/20 1413  AST 19  ALT 16  ALKPHOS 100  BILITOT 0.7  PROT 6.6  ALBUMIN 3.9  No results for input(s): LIPASE, AMYLASE in the last 8760 hours. No results for input(s): AMMONIA in the last 8760 hours. CBC: Recent Labs    01/18/20 1413 01/20/20 0236 01/21/20 0515  WBC 7.0 5.5 5.7  NEUTROABS 4.7  --   --   HGB 14.1 12.6 12.4  HCT 43.1 38.5 37.5  MCV 90.7 90.0 89.7  PLT 157 132* 125*   Lipid No results for input(s): CHOL, HDL, LDLCALC, TRIG in the last 8760 hours.  Cardiac Enzymes: No results for input(s): CKTOTAL, CKMB, CKMBINDEX, TROPONINI in the last 8760 hours. BNP: Recent Labs    01/18/20 1413  BNP 176.7*   No results found for: [facility] Lab Results  Component Value Date   HGBA1C 5.1 01/02/2016   Lab Results  Component Value Date   TSH 1.047 01/18/2020   No results found for: VITAMINB12 No results found for: FOLATE No results found for: IRON, TIBC, FERRITIN  Imaging and Procedures obtained prior to SNF admission: DG Chest 1 View  Result Date: 01/18/2020 CLINICAL DATA:  Fall, back pain, shortness of breath EXAM: CHEST  1 VIEW COMPARISON:  06/18/2017 FINDINGS: The heart size and mediastinal contours are within normal limits. Opacity with bubbly internal contents at the medial right lung base compatible with known moderate-sized hiatal hernia. Both lungs are otherwise clear. No pleural effusion or pneumothorax. Abnormal contour of the inferior aspect of the right glenoid neck concerning for fracture. IMPRESSION: 1. No active cardiopulmonary disease. 2. Abnormal cortical contour of the right scapula at the inferior glenoid neck concerning for fracture. 3. Hiatal hernia. Electronically Signed   By: Davina Poke D.O.   On: 01/18/2020 15:47   DG Shoulder Right  Result Date: 01/18/2020 CLINICAL DATA:  Shoulder pain after fall EXAM: RIGHT SHOULDER - 2+ VIEW  COMPARISON:  07/16/2016 FINDINGS: Cortical irregularity with subtle angulation of the right scapula at the inferior glenoid neck, which was slightly more conspicuous on the frontal chest radiograph which was obtained concurrently. Glenohumeral joint is intact without fracture or dislocation. AC joint is intact. Soft tissues appear within normal limits. IMPRESSION: Cortical irregularity with subtle angulation of the right scapula at the inferior glenoid neck, which raises suspicion for a nondisplaced fracture. If further evaluation of this area is clinically warranted, CT can be performed. Electronically Signed   By: Davina Poke D.O.   On: 01/18/2020 15:51   CT Cervical Spine Wo Contrast  Result Date: 01/18/2020 CLINICAL DATA:  79 year old female with trauma. EXAM: CT CERVICAL SPINE WITHOUT CONTRAST TECHNIQUE: Multidetector CT imaging of the cervical spine was performed without intravenous contrast. Multiplanar CT image reconstructions were also generated. COMPARISON:  CT of the cervical spine dated 08/08/2018. FINDINGS: Evaluation of this exam is limited due to motion artifact. Alignment: No acute subluxation.  Grade 1 C4-C5 anterolisthesis. Skull base and vertebrae: No acute fracture.  Osteopenia. Soft tissues and spinal canal: No prevertebral fluid or swelling. No visible canal hematoma. Disc levels: Multilevel degenerative changes with endplate irregularity and disc space narrowing. There is multilevel facet arthropathy Upper chest: Mild emphysema. Other: Bilateral carotid bulb calcified plaques. IMPRESSION: 1. No definite acute/traumatic cervical spine pathology. 2. Multiple degenerative changes. Electronically Signed   By: Anner Crete M.D.   On: 01/18/2020 15:37   DG Hip Unilat With Pelvis 2-3 Views Right  Result Date: 01/18/2020 CLINICAL DATA:  Fall, right hip pain EXAM: DG HIP (WITH OR WITHOUT PELVIS) 2-3V RIGHT COMPARISON:  CT 10/25/2016 FINDINGS: Mildly displaced fractures of the right  superior and  inferior pubic rami, which are new from prior. Pubic symphysis is intact without diastasis. SI joints are intact. Right hip joint is intact without fracture or dislocation. Bones are demineralized. Vascular calcifications. IMPRESSION: Mildly displaced fractures of the right superior and inferior pubic rami, which are new from prior. Electronically Signed   By: Davina Poke D.O.   On: 01/18/2020 15:42     Not all labs, radiology exams or other studies done during hospitalization come through on my EPIC note; however they are reviewed by me.    Assessment and Plan  Right scapular fracture/right upper and lower pubic rami fractures-secondary to fall; Ortho recommends supportive management and PT. SNF-admitted for OT/PT  Hypertension SNF-continue Norvasc 10 mg daily and labetalol 100 mg twice daily  Dementia SNF-oriented x3 continue Aricept 10 mg daily  Hyperlipidemia SNF-not stated as uncontrolled; continue Lipitor 20 mg daily  GERD SNF-continue omeprazole 40 mg daily; appears controlled  Insomnia SNF-no complaints; continue Seroquel 25 mg nightly and trazodone 100 mg nightly  Depression SNF-continue Effexor 225 mg nightly    Time spent greater than 45 minutes;> 50% of time with patient was spent reviewing records, labs, tests and studies, counseling and developing plan of care  Hennie Duos, MD

## 2020-01-29 ENCOUNTER — Encounter: Payer: Self-pay | Admitting: Internal Medicine

## 2020-01-31 ENCOUNTER — Encounter: Payer: Self-pay | Admitting: Internal Medicine

## 2020-01-31 ENCOUNTER — Non-Acute Institutional Stay (SKILLED_NURSING_FACILITY): Payer: Medicare Other | Admitting: Internal Medicine

## 2020-01-31 DIAGNOSIS — G4700 Insomnia, unspecified: Secondary | ICD-10-CM | POA: Diagnosis not present

## 2020-01-31 DIAGNOSIS — I10 Essential (primary) hypertension: Secondary | ICD-10-CM

## 2020-01-31 DIAGNOSIS — S32591A Other specified fracture of right pubis, initial encounter for closed fracture: Secondary | ICD-10-CM

## 2020-01-31 DIAGNOSIS — F0390 Unspecified dementia without behavioral disturbance: Secondary | ICD-10-CM | POA: Diagnosis not present

## 2020-01-31 NOTE — Progress Notes (Signed)
Location:    [facility] Room Number: 108/P Place of Service:  SNF [phone]) Provider:  Driscilla Moats, MD  Patient Care Team: Seward Carol, MD as PCP - General  Extended Emergency Contact Information Primary Emergency Contact: Caton,Gene Address: [address]          [place], [place] [address] [place] Phone: [phone] Mobile Phone: [phone] Relation: Son  Code Status:  DNR Goals of care: Advanced Directive information Advanced Directives 01/31/2020  Does Patient Have a Medical Advance Directive? No  Type of Advance Directive Out of facility DNR (pink MOST or yellow form)  Does patient want to make changes to medical advance directive? No - Patient declined  Copy of [place] in Chart? -  Would patient like information on creating a medical advance directive? -  Pre-existing out of facility DNR order (yellow form or pink MOST form) Yellow form placed in chart (order not valid for inpatient use)     Chief Complaint  Patient presents with  . Follow-up    Follow Up Visit   Follow-up visit status post hospitalization recently for right pubic rami fractures and right scapular fracture  HPI:  Pt is a 79 y.o. female seen today for an acute visit for follow-up after recent hospitalization for fall with right pubic rami fractures as well as a right scapular fracture.  She appears to be doing well in this regard she does have Tylenol for pain-and has been followed by orthopedics with recommendation for supportive care and she appears to be doing okay with this.  She has also has a history of hypertension on labetalol 100 mg twice daily and Norvasc 10 mg a day.  She also has a history of dementia and is on Aricept 10 mg a day  Currently she is lying in bed comfortably does not really have any complaints vital signs appear to be stable   Past Medical History:  Diagnosis Date  . Anxiety   .  Arthritis   . COPD (chronic obstructive pulmonary disease) ([place])   . Hyperlipidemia   . Hypertension   . Hypothyroid   . Insomnia   . Reflux   . Repeated falls   . Skin cancer   . Stress   . Stroke (cerebrum) [facility])    Past Surgical History:  Procedure Laterality Date  . COLONOSCOPY     With polyp removal   . MANDIBLE FRACTURE SURGERY     Placement of partial jaw due to abcess  . SKIN CANCER EXCISION     Removal of several skin cancers  . TOE SURGERY Right    Removal of toe     Allergies  Allergen Reactions  . Penicillins Hives and Other (See Comments)    Hives and swelling as a child Has patient had a PCN reaction causing immediate rash, facial/tongue/throat swelling, SOB or lightheadedness with hypotension: Yes Has patient had a PCN reaction causing severe rash involving mucus membranes or skin necrosis: No Has patient had a PCN reaction that required hospitalization No Has patient had a PCN reaction occurring within the last 10 years: No If all of the above answers are "NO", then may proceed with Cephalosporin use.   . Ceftin [Cefuroxime Axetil] Other (See Comments)    In Epic since 2017, so leaving this (allergy not noted on MAR in 2021??)  . Simvastatin Other (See Comments)    In Epic since 2017, so leaving  this (allergy not noted on MAR in 2021??)    Outpatient Encounter Medications as of 01/31/2020  Medication Sig  . acetaminophen (TYLENOL) 500 MG tablet Take 1,000 mg by mouth two times a day  . amLODipine (NORVASC) 10 MG tablet Take 10 mg by mouth daily.  Marland Kitchen atorvastatin (LIPITOR) 20 MG tablet Take 20 mg by mouth daily.  . bisacodyl (DULCOLAX) 10 MG suppository If not relieved by MOM, give 10 mg Bisacodyl suppositiory rectally X 1 dose in 24 hours as needed (Do not use constipation standing orders for residents with renal failure/CFR less than 30. Contact MD for orders) (Physician Order)  . donepezil (ARICEPT) 10 MG tablet Take 1 tablet (10 mg total) daily by mouth.  After meal  . hydrOXYzine (ATARAX/VISTARIL) 50 MG tablet Take 50 mg by mouth every 12 (twelve) hours as needed for anxiety.   Marland Kitchen labetalol (NORMODYNE) 100 MG tablet Take 100 mg by mouth 2 (two) times daily.   Marland Kitchen loperamide (IMODIUM) 2 MG capsule Take 2 mg by mouth daily as needed (for diarrhea).   . loratadine (CLARITIN) 10 MG tablet Take 10 mg by mouth daily.  . magnesium hydroxide (MILK OF MAGNESIA) 400 MG/5ML suspension If no BM in 3 days, give 30 cc Milk of Magnesium p.o. x 1 dose in 24 hours as needed (Do not use standing constipation orders for residents with renal failure CFR less than 30. Contact MD for orders) (Physician Order)  . nicotine (NICODERM CQ - DOSED IN MG/24 HOURS) 21 mg/24hr patch Place 1 patch (21 mg total) onto the skin daily.  Marland Kitchen omeprazole (PRILOSEC) 40 MG capsule Take 40 mg by mouth daily before breakfast.   . QUEtiapine (SEROQUEL) 25 MG tablet Take 25 mg by mouth every evening.   . sodium chloride (OCEAN) 0.65 % SOLN nasal spray Place 2 sprays into both nostrils daily as needed (for dryness).   . Sodium Phosphates (RA SALINE ENEMA RE) If not relieved by Biscodyl suppository, give disposable Saline Enema rectally X 1 dose/24 hrs as needed (Do not use constipation standing orders for residents with renal failure/CFR less than 30. Contact MD for orders)(Physician Or  . traMADol (ULTRAM) 50 MG tablet Take 50 mg by mouth every 4 (four) hours as needed (For Breakthrough Pain).  . traZODone (DESYREL) 100 MG tablet Take 200 mg by mouth at bedtime.   . trolamine salicylate (ASPERCREME) 10 % cream Apply 1 application topically See admin instructions. Apply to both hands at bedtime  . Trospium Chloride 60 MG CP24 Take 60 mg by mouth daily.   . Venlafaxine HCl 75 MG TB24 Take 225 mg by mouth every morning.  . [DISCONTINUED] labetalol (NORMODYNE) 100 MG tablet Take 1 tablet (100 mg total) by mouth 2 (two) times daily.  . [DISCONTINUED] traMADol (ULTRAM) 50 MG tablet Take 1 tablet (50 mg  total) by mouth every 6 (six) hours as needed (for breakthrough pain). (Patient taking differently: Take 50 mg by mouth every 4 (four) hours as needed (for breakthrough pain). )   No facility-administered encounter medications on file as of 01/31/2020.    Review of Systems  In general she is not complaining of any fever or chills.  Skin does not complain of rashes itching or diaphoresis.  Head ears eyes nose mouth and throat does not complain of visual changes or sore throat.  Respiratory not complain of shortness of breath or cough at this time.  Cardiac does not complain of chest pain or increased edema.  GI is  not complaining of abdominal discomfort nausea vomiting diarrhea says she has not had a bowel movement in a while and staff has given her a laxative.  GU is not complaining of dysuria.  Musculoskeletal does not really complain of joint pain or pubic pain or shoulder pain at this time.  Neurologic does not complain of dizziness headache or numbness.  And psych does not complain of being overtly depressed or anxious I do note she is on Effexor.     Immunization History  Administered Date(s) Administered  . Influenza, High Dose Seasonal PF 10/23/2017  . Influenza-Unspecified 10/31/2015  . PPD Test 01/04/2016, 01/18/2016  . Pneumococcal-Unspecified 01/11/2016  . Td 07/29/2014   Pertinent  Health Maintenance Due  Topic Date Due  . DEXA SCAN  05/14/2008  . PNA vac Low Risk Adult (2 of 2 - PCV13) 01/10/2017  . INFLUENZA VACCINE  07/31/2019   No flowsheet data found. Functional Status Survey:    Vitals:   01/31/20 1505  BP: 135/72  Pulse: 60  Resp: 18  Temp: 97.9 F (36.6 C)  TempSrc: Oral  SpO2: 93%  Weight: 122 lb 6.4 oz (55.5 kg)  Height: 5\' 5"  (1.651 m)   Body mass index is 20.37 kg/m. Physical Exam In general this is a pleasant elderly female in no distress lying comfortably in bed.  Her skin is warm and dry.  Eyes visual acuity appears to be  intact sclera and conjunctive are clear.  Oropharynx clear mucous membranes moist.--She has numerous extractions  Chest is clear to auscultation there is no labored breathing.  Heart is regular rate and rhythm without murmur gallop or rub she does not have significant lower extremity edema.  Abdomen is soft nontender with positive bowel sounds.  Musculoskeletal Limited exam since she is in bed but isable to move all extremities x4 she does have some history of tenderness more to her right shoulder area with the right scapula fracture  Neurologic is grossly intact her speech is clear cannot appreciate lateralizing findings.  Psych she is pleasant and appropriate her dementia appears to be fairly mild  Labs reviewed: Recent Labs    01/19/20 0215 01/20/20 0236 01/21/20 0515  NA 136 136 135  K 3.7 3.5 3.9  CL 104 105 102  CO2 23 23 23   GLUCOSE 99 115* 123*  BUN 12 19 21   CREATININE 0.58 0.66 0.60  CALCIUM 8.8* 8.8* 8.5*   Recent Labs    01/18/20 1413  AST 19  ALT 16  ALKPHOS 100  BILITOT 0.7  PROT 6.6  ALBUMIN 3.9   Recent Labs    01/18/20 1413 01/20/20 0236 01/21/20 0515  WBC 7.0 5.5 5.7  NEUTROABS 4.7  --   --   HGB 14.1 12.6 12.4  HCT 43.1 38.5 37.5  MCV 90.7 90.0 89.7  PLT 157 132* 125*   Lab Results  Component Value Date   TSH 1.047 01/18/2020   Lab Results  Component Value Date   HGBA1C 5.1 01/02/2016   Lab Results  Component Value Date   CHOL 279 (A) 01/02/2016   HDL 49 01/02/2016   LDLCALC 201 01/02/2016   TRIG 149 01/02/2016    Significant Diagnostic Results in last 30 days:  DG Chest 1 View  Result Date: 01/18/2020 CLINICAL DATA:  Fall, back pain, shortness of breath EXAM: CHEST  1 VIEW COMPARISON:  06/18/2017 FINDINGS: The heart size and mediastinal contours are within normal limits. Opacity with bubbly internal contents at the medial right lung  base compatible with known moderate-sized hiatal hernia. Both lungs are otherwise clear. No  pleural effusion or pneumothorax. Abnormal contour of the inferior aspect of the right glenoid neck concerning for fracture. IMPRESSION: 1. No active cardiopulmonary disease. 2. Abnormal cortical contour of the right scapula at the inferior glenoid neck concerning for fracture. 3. Hiatal hernia. Electronically Signed   By: Davina Poke D.O.   On: 01/18/2020 15:47   DG Shoulder Right  Result Date: 01/18/2020 CLINICAL DATA:  Shoulder pain after fall EXAM: RIGHT SHOULDER - 2+ VIEW COMPARISON:  07/16/2016 FINDINGS: Cortical irregularity with subtle angulation of the right scapula at the inferior glenoid neck, which was slightly more conspicuous on the frontal chest radiograph which was obtained concurrently. Glenohumeral joint is intact without fracture or dislocation. AC joint is intact. Soft tissues appear within normal limits. IMPRESSION: Cortical irregularity with subtle angulation of the right scapula at the inferior glenoid neck, which raises suspicion for a nondisplaced fracture. If further evaluation of this area is clinically warranted, CT can be performed. Electronically Signed   By: Davina Poke D.O.   On: 01/18/2020 15:51   CT Cervical Spine Wo Contrast  Result Date: 01/18/2020 CLINICAL DATA:  79 year old female with trauma. EXAM: CT CERVICAL SPINE WITHOUT CONTRAST TECHNIQUE: Multidetector CT imaging of the cervical spine was performed without intravenous contrast. Multiplanar CT image reconstructions were also generated. COMPARISON:  CT of the cervical spine dated 08/08/2018. FINDINGS: Evaluation of this exam is limited due to motion artifact. Alignment: No acute subluxation.  Grade 1 C4-C5 anterolisthesis. Skull base and vertebrae: No acute fracture.  Osteopenia. Soft tissues and spinal canal: No prevertebral fluid or swelling. No visible canal hematoma. Disc levels: Multilevel degenerative changes with endplate irregularity and disc space narrowing. There is multilevel facet arthropathy  Upper chest: Mild emphysema. Other: Bilateral carotid bulb calcified plaques. IMPRESSION: 1. No definite acute/traumatic cervical spine pathology. 2. Multiple degenerative changes. Electronically Signed   By: Anner Crete M.D.   On: 01/18/2020 15:37   DG Hip Unilat With Pelvis 2-3 Views Right  Result Date: 01/18/2020 CLINICAL DATA:  Fall, right hip pain EXAM: DG HIP (WITH OR WITHOUT PELVIS) 2-3V RIGHT COMPARISON:  CT 10/25/2016 FINDINGS: Mildly displaced fractures of the right superior and inferior pubic rami, which are new from prior. Pubic symphysis is intact without diastasis. SI joints are intact. Right hip joint is intact without fracture or dislocation. Bones are demineralized. Vascular calcifications. IMPRESSION: Mildly displaced fractures of the right superior and inferior pubic rami, which are new from prior. Electronically Signed   By: Davina Poke D.O.   On: 01/18/2020 15:42    Assessment/Plan  #1 history of right scapula fracture with right upper and lower pubic rami fractures-this was after a fall at this point continue supportive management per Ortho recommendation as well as PT OT-her pain appears to be controlled she does have Tylenol twice a day for pain  2.  Hypertension at this point appears to be stable recent systolics range from the low 100s to 135 she is on Norvasc 10 mg a day and labetalol 100 mg twice daily will write an order to hold labetalol for pulse less than 60 or systolic blood pressure less than 100. I do note her systolic was [id number] apparently earlier this morning.  3.  History of dementia this appears to be fairly mild she is on Aricept 10 mg a day continue supportive care.  4.  Insomnia she continues on Seroquel 25 mg nightly as well as  trazodone 100 mg q. nightly-this appears to be well-tolerated.  5.  Depression she is on Effexor 225 mg a day this appears stable.  6.  History of GERD she continues on Prilosec 40 mg a day and appears stable.  7.   History of overactive bladder she continues on trospium 60 mg a day she is not really complaining of issues in this regard.  [id number]

## 2020-02-01 ENCOUNTER — Encounter: Payer: Self-pay | Admitting: Internal Medicine

## 2020-02-02 ENCOUNTER — Other Ambulatory Visit: Payer: Self-pay | Admitting: *Deleted

## 2020-02-02 NOTE — Patient Outreach (Signed)
Screened for potential [facility] Care Management needs as a benefit of  [employer].  Kendra Hernandez is currently receiving skilled therapy at [facility].   Writer attended telephonic interdisciplinary team meeting to assess for disposition needs and transition plan for resident.   Facility staff reports member has had pain control issues. She is s/p right upper and lower pubic rami fractures/right scapular fracture.  She has a scheduled follow up ortho appointment next week.   Reports transition plan is to return to ALF upon SNF dc.   Will continue to follow while residing in SNF.  Kendra Rolling, MSN-Ed, RN,BSN [place] Acute Care Coordinator [phone] [facility]) [phone]  (Toll free office)

## 2020-02-08 ENCOUNTER — Non-Acute Institutional Stay (SKILLED_NURSING_FACILITY): Payer: Medicare Other | Admitting: Internal Medicine

## 2020-02-08 DIAGNOSIS — F02818 Dementia in other diseases classified elsewhere, unspecified severity, with other behavioral disturbance: Secondary | ICD-10-CM

## 2020-02-08 DIAGNOSIS — S42101A Fracture of unspecified part of scapula, right shoulder, initial encounter for closed fracture: Secondary | ICD-10-CM

## 2020-02-08 DIAGNOSIS — S32591A Other specified fracture of right pubis, initial encounter for closed fracture: Secondary | ICD-10-CM | POA: Diagnosis not present

## 2020-02-08 DIAGNOSIS — F0281 Dementia in other diseases classified elsewhere with behavioral disturbance: Secondary | ICD-10-CM | POA: Diagnosis not present

## 2020-02-09 DIAGNOSIS — Z23 Encounter for immunization: Secondary | ICD-10-CM | POA: Diagnosis not present

## 2020-02-11 DIAGNOSIS — R102 Pelvic and perineal pain: Secondary | ICD-10-CM | POA: Diagnosis not present

## 2020-02-11 DIAGNOSIS — M25511 Pain in right shoulder: Secondary | ICD-10-CM | POA: Diagnosis not present

## 2020-02-13 ENCOUNTER — Encounter: Payer: Self-pay | Admitting: Internal Medicine

## 2020-02-13 NOTE — Progress Notes (Signed)
Location:   [facility]:   SNF Provider:Tanya Marvin d Aritha Huckeba MD  Seward Carol, MD  Patient Care Team: Seward Carol, MD as PCP - General  Extended Emergency Contact Information Primary Emergency Contact: Caton,Gene Address: [address]          [place], [place] [address] [place] Phone: [phone] Mobile Phone: [phone] Relation: Son    Allergies: Penicillins, Ceftin [cefuroxime axetil], and Simvastatin  Chief Complaint  Patient presents with  . Acute Visit    HPI: Patient is 79 y.o. female who is being seen by me today because she has said that she has not seen a doctor since she has been here.  I clearly saw her on 1/26 when she was admitted.  Patient does have dementia and forgets.  Patient did seem incredulous that was a female and a physician.  She is called to me for not wearing a white coat.  Past Medical History:  Diagnosis Date  . Anxiety   . Arthritis   . COPD (chronic obstructive pulmonary disease) ([place])   . Hyperlipidemia   . Hypertension   . Hypothyroid   . Insomnia   . Reflux   . Repeated falls   . Skin cancer   . Stress   . Stroke (cerebrum) [facility])     Past Surgical History:  Procedure Laterality Date  . COLONOSCOPY     With polyp removal   . MANDIBLE FRACTURE SURGERY     Placement of partial jaw due to abcess  . SKIN CANCER EXCISION     Removal of several skin cancers  . TOE SURGERY Right    Removal of toe     Allergies as of 02/08/2020      Reactions   Penicillins Hives, Other (See Comments)   Hives and swelling as a child Has patient had a PCN reaction causing immediate rash, facial/tongue/throat swelling, SOB or lightheadedness with hypotension: Yes Has patient had a PCN reaction causing severe rash involving mucus membranes or skin necrosis: No Has patient had a PCN reaction that required hospitalization No Has patient had a PCN reaction occurring within the last 10 years: No If all of the  above answers are "NO", then may proceed with Cephalosporin use.   Ceftin [cefuroxime Axetil] Other (See Comments)   In Epic since 2017, so leaving this (allergy not noted on MAR in 2021??)   Simvastatin Other (See Comments)   In Epic since 2017, so leaving this (allergy not noted on MAR in 2021??)      Medication List       Accurate as of February 08, 2020 11:59 PM. If you have any questions, ask your nurse or doctor.        acetaminophen 500 MG tablet Commonly known as: TYLENOL Take 1,000 mg by mouth two times a day   amLODipine 10 MG tablet Commonly known as: NORVASC Take 10 mg by mouth daily.   atorvastatin 20 MG tablet Commonly known as: LIPITOR Take 20 mg by mouth daily.   bisacodyl 10 MG suppository Commonly known as: DULCOLAX If not relieved by MOM, give 10 mg Bisacodyl suppositiory rectally X 1 dose in 24 hours as needed (Do not use constipation standing orders for residents with renal failure/CFR less than 30. Contact MD for orders) (Physician Order)   donepezil 10 MG tablet Commonly known as: ARICEPT Take 1 tablet (10 mg total) daily by mouth. After meal   hydrOXYzine 50 MG tablet  Commonly known as: ATARAX/VISTARIL Take 50 mg by mouth every 12 (twelve) hours as needed for anxiety.   labetalol 100 MG tablet Commonly known as: NORMODYNE Take 100 mg by mouth 2 (two) times daily.   loperamide 2 MG capsule Commonly known as: IMODIUM Take 2 mg by mouth daily as needed (for diarrhea).   loratadine 10 MG tablet Commonly known as: CLARITIN Take 10 mg by mouth daily.   magnesium hydroxide 400 MG/5ML suspension Commonly known as: MILK OF MAGNESIA If no BM in 3 days, give 30 cc Milk of Magnesium p.o. x 1 dose in 24 hours as needed (Do not use standing constipation orders for residents with renal failure CFR less than 30. Contact MD for orders) (Physician Order)   nicotine 21 mg/24hr patch Commonly known as: NICODERM CQ - dosed in mg/24 hours Place 1 patch (21 mg  total) onto the skin daily.   omeprazole 40 MG capsule Commonly known as: PRILOSEC Take 40 mg by mouth daily before breakfast.   QUEtiapine 25 MG tablet Commonly known as: SEROQUEL Take 25 mg by mouth every evening.   RA SALINE ENEMA RE If not relieved by Biscodyl suppository, give disposable Saline Enema rectally X 1 dose/24 hrs as needed (Do not use constipation standing orders for residents with renal failure/CFR less than 30. Contact MD for orders)(Physician Or   sodium chloride 0.65 % Soln nasal spray Commonly known as: OCEAN Place 2 sprays into both nostrils daily as needed (for dryness).   traZODone 100 MG tablet Commonly known as: DESYREL Take 200 mg by mouth at bedtime.   trolamine salicylate 10 % cream Commonly known as: ASPERCREME Apply 1 application topically See admin instructions. Apply to both hands at bedtime   Trospium Chloride 60 MG Cp24 Take 60 mg by mouth daily.   Venlafaxine HCl 75 MG Tb24 Take 225 mg by mouth every morning.       No orders of the defined types were placed in this encounter.   Immunization History  Administered Date(s) Administered  . Influenza, High Dose Seasonal PF 10/23/2017  . Influenza-Unspecified 10/31/2015  . PPD Test 01/04/2016, 01/18/2016  . Pneumococcal-Unspecified 01/11/2016  . Td 07/29/2014    Social History   Tobacco Use  . Smoking status: Current Every Day Smoker    Packs/day: 2.00    Years: 30.00    Pack years: 60.00    Types: Cigarettes  . Smokeless tobacco: Never Used  Substance Use Topics  . Alcohol use: Not Currently    Alcohol/week: 0.0 standard drinks    Comment: last drink 2 wks ago 11-05-16, but is uncontrollable.    Review of Systems  GENERAL:  no fevers, fatigue, appetite changes SKIN: No itching, rash HEENT: No complaint RESPIRATORY: No cough, wheezing, SOB CARDIAC: No chest pain, palpitations, lower extremity edema  GI: No abdominal pain, No N/V/D or constipation, No heartburn or reflux   GU: No dysuria, frequency or urgency, or incontinence  MUSCULOSKELETAL: No unrelieved bone/joint pain NEUROLOGIC: No headache, dizziness  PSYCHIATRIC: No overt anxiety or sadness  Vitals:   02/13/20 1625  BP: 135/72  Pulse: 60  Resp: 18  Temp: 97.9 F (36.6 C)   Body mass index is 20.3 kg/m. Physical Exam  GENERAL APPEARANCE: Alert, conversant, No acute distress  SKIN: No diaphoresis rash HEENT: Unremarkable RESPIRATORY: Breathing is even, unlabored. Lung sounds are clear   CARDIOVASCULAR: Heart RRR no murmurs, rubs or gallops. No peripheral edema  GASTROINTESTINAL: Abdomen is soft, non-tender, not distended w/ normal  bowel sounds.  GENITOURINARY: Bladder non tender, not distended  MUSCULOSKELETAL: No abnormal joints or musculature NEUROLOGIC: Cranial nerves 2-12 grossly intact. Moves all extremities PSYCHIATRIC: Mood and affect appropriate with dementia, no behavioral issues  Patient Active Problem List   Diagnosis Date Noted  . Multiple closed stable fractures of pubic ramus ([place]) 01/18/2020  . Hypoxia 01/18/2020  . Closed right scapular fracture 01/18/2020  . COPD (chronic obstructive pulmonary disease) ([place]) 01/18/2020  . Dementia with behavioral disturbance ([place]) 11/11/2017  . Chronic ischemic right MCA stroke 11/11/2017  . Alcoholism, chronic ([employer]) 06/27/2017  . Isopropyl alcohol poisoning   . Major depressive disorder, recurrent episode ([place]) 12/21/2016  . Major depressive disorder, recurrent episode, severe ([place]) 11/24/2016  . Alcohol withdrawal ([place]) 07/17/2016  . Withdrawal symptoms, alcohol ([place]) 07/17/2016  . Hypothyroidism 07/17/2016  . History of CVA (cerebrovascular accident) 07/17/2016  . Head contusion 07/17/2016  . Fall 07/17/2016  . Insomnia 07/17/2016  . Essential hypertension 07/17/2016  . Abnormality of gait 01/24/2016  . Memory loss 01/24/2016  . Acute right MCA stroke ([place]) 01/09/2016  . Essential hypertension, benign 01/09/2016  .  Hyperlipidemia LDL goal <100 01/09/2016  . Other specified hypothyroidism 01/09/2016  . Cervicalgia 01/09/2016  . Esophageal reflux 01/09/2016  . Major depression, chronic 01/09/2016    CMP     Component Value Date/Time   NA 135 01/21/2020 0515   NA 139 01/02/2016 0000   K 3.9 01/21/2020 0515   CL 102 01/21/2020 0515   CO2 23 01/21/2020 0515   GLUCOSE 123 (H) 01/21/2020 0515   BUN 21 01/21/2020 0515   BUN 12 01/02/2016 0000   CREATININE 0.60 01/21/2020 0515   CALCIUM 8.5 (L) 01/21/2020 0515   PROT 6.6 01/18/2020 1413   ALBUMIN 3.9 01/18/2020 1413   AST 19 01/18/2020 1413   ALT 16 01/18/2020 1413   ALKPHOS 100 01/18/2020 1413   BILITOT 0.7 01/18/2020 1413   GFRNONAA >60 01/21/2020 0515   GFRAA >60 01/21/2020 0515   Recent Labs    01/19/20 0215 01/20/20 0236 01/21/20 0515  NA 136 136 135  K 3.7 3.5 3.9  CL 104 105 102  CO2 23 23 23   GLUCOSE 99 115* 123*  BUN 12 19 21   CREATININE 0.58 0.66 0.60  CALCIUM 8.8* 8.8* 8.5*   Recent Labs    01/18/20 1413  AST 19  ALT 16  ALKPHOS 100  BILITOT 0.7  PROT 6.6  ALBUMIN 3.9   Recent Labs    01/18/20 1413 01/20/20 0236 01/21/20 0515  WBC 7.0 5.5 5.7  NEUTROABS 4.7  --   --   HGB 14.1 12.6 12.4  HCT 43.1 38.5 37.5  MCV 90.7 90.0 89.7  PLT 157 132* 125*   No results for input(s): CHOL, LDLCALC, TRIG in the last 8760 hours.  Invalid input(s): HCL No results found for: [facility] Lab Results  Component Value Date   TSH 1.047 01/18/2020   Lab Results  Component Value Date   HGBA1C 5.1 01/02/2016   Lab Results  Component Value Date   CHOL 279 (A) 01/02/2016   HDL 49 01/02/2016   [place] 201 01/02/2016   TRIG 149 01/02/2016    Significant Diagnostic Results in last 30 days:  DG Chest 1 View  Result Date: 01/18/2020 CLINICAL DATA:  Fall, back pain, shortness of breath EXAM: CHEST  1 VIEW COMPARISON:  06/18/2017 FINDINGS: The heart size and mediastinal contours are within normal limits. Opacity with  bubbly internal contents at the medial  right lung base compatible with known moderate-sized hiatal hernia. Both lungs are otherwise clear. No pleural effusion or pneumothorax. Abnormal contour of the inferior aspect of the right glenoid neck concerning for fracture. IMPRESSION: 1. No active cardiopulmonary disease. 2. Abnormal cortical contour of the right scapula at the inferior glenoid neck concerning for fracture. 3. Hiatal hernia. Electronically Signed   By: Davina Poke D.O.   On: 01/18/2020 15:47   DG Shoulder Right  Result Date: 01/18/2020 CLINICAL DATA:  Shoulder pain after fall EXAM: RIGHT SHOULDER - 2+ VIEW COMPARISON:  07/16/2016 FINDINGS: Cortical irregularity with subtle angulation of the right scapula at the inferior glenoid neck, which was slightly more conspicuous on the frontal chest radiograph which was obtained concurrently. Glenohumeral joint is intact without fracture or dislocation. AC joint is intact. Soft tissues appear within normal limits. IMPRESSION: Cortical irregularity with subtle angulation of the right scapula at the inferior glenoid neck, which raises suspicion for a nondisplaced fracture. If further evaluation of this area is clinically warranted, CT can be performed. Electronically Signed   By: Davina Poke D.O.   On: 01/18/2020 15:51   CT Cervical Spine Wo Contrast  Result Date: 01/18/2020 CLINICAL DATA:  79 year old female with trauma. EXAM: CT CERVICAL SPINE WITHOUT CONTRAST TECHNIQUE: Multidetector CT imaging of the cervical spine was performed without intravenous contrast. Multiplanar CT image reconstructions were also generated. COMPARISON:  CT of the cervical spine dated 08/08/2018. FINDINGS: Evaluation of this exam is limited due to motion artifact. Alignment: No acute subluxation.  Grade 1 C4-C5 anterolisthesis. Skull base and vertebrae: No acute fracture.  Osteopenia. Soft tissues and spinal canal: No prevertebral fluid or swelling. No visible canal  hematoma. Disc levels: Multilevel degenerative changes with endplate irregularity and disc space narrowing. There is multilevel facet arthropathy Upper chest: Mild emphysema. Other: Bilateral carotid bulb calcified plaques. IMPRESSION: 1. No definite acute/traumatic cervical spine pathology. 2. Multiple degenerative changes. Electronically Signed   By: Anner Crete M.D.   On: 01/18/2020 15:37   DG Hip Unilat With Pelvis 2-3 Views Right  Result Date: 01/18/2020 CLINICAL DATA:  Fall, right hip pain EXAM: DG HIP (WITH OR WITHOUT PELVIS) 2-3V RIGHT COMPARISON:  CT 10/25/2016 FINDINGS: Mildly displaced fractures of the right superior and inferior pubic rami, which are new from prior. Pubic symphysis is intact without diastasis. SI joints are intact. Right hip joint is intact without fracture or dislocation. Bones are demineralized. Vascular calcifications. IMPRESSION: Mildly displaced fractures of the right superior and inferior pubic rami, which are new from prior. Electronically Signed   By: Davina Poke D.O.   On: 01/18/2020 15:42    Assessment and Plan  Scapular fracture/pubic rami fractures/dementia without behaviors-I remember patient very well, I remember commenting to her how difficult it is to fracture a scapula.  We had a very nice conversation.  She had multiple questions for me which I answered in which I am sure she will forget secondary to her dementia.  She is very pleasant.  Continue OT/PT and continue supportive care.  No problem-specific Assessment & Plan notes found for this encounter.   Labs/tests ordered:    Hennie Duos, MD

## 2020-02-14 ENCOUNTER — Encounter: Payer: Self-pay | Admitting: Internal Medicine

## 2020-02-14 ENCOUNTER — Non-Acute Institutional Stay (SKILLED_NURSING_FACILITY): Payer: Medicare Other | Admitting: Internal Medicine

## 2020-02-14 DIAGNOSIS — I10 Essential (primary) hypertension: Secondary | ICD-10-CM | POA: Diagnosis not present

## 2020-02-14 DIAGNOSIS — S32591A Other specified fracture of right pubis, initial encounter for closed fracture: Secondary | ICD-10-CM | POA: Diagnosis not present

## 2020-02-14 DIAGNOSIS — F0281 Dementia in other diseases classified elsewhere with behavioral disturbance: Secondary | ICD-10-CM | POA: Diagnosis not present

## 2020-02-14 DIAGNOSIS — S42101A Fracture of unspecified part of scapula, right shoulder, initial encounter for closed fracture: Secondary | ICD-10-CM

## 2020-02-14 DIAGNOSIS — K219 Gastro-esophageal reflux disease without esophagitis: Secondary | ICD-10-CM

## 2020-02-14 DIAGNOSIS — F02818 Dementia in other diseases classified elsewhere, unspecified severity, with other behavioral disturbance: Secondary | ICD-10-CM

## 2020-02-14 NOTE — Progress Notes (Signed)
Location:    [facility] Room Number: 103/P Place of Service:  SNF (31) Provider:  Ceasar Mons, MD  Patient Care Team: Seward Carol, MD as PCP - General  Extended Emergency Contact Information Primary Emergency Contact: Caton,Gene Address: [address]          [place], [place] [address] [place] Phone: [phone] Mobile Phone: [phone] Relation: Son  Code Status:  DNR Goals of care: Advanced Directive information Advanced Directives 02/14/2020  Does Patient Have a Medical Advance Directive? Yes  Type of Advance Directive Out of facility DNR (pink MOST or yellow form)  Does patient want to make changes to medical advance directive? No - Patient declined  Copy of [place] in Chart? -  Would patient like information on creating a medical advance directive? -  Pre-existing out of facility DNR order (yellow form or pink MOST form) Yellow form placed in chart (order not valid for inpatient use)     Chief Complaint  Patient presents with  . Medical Management of Chronic Issues    Routine visit of medical managemnet  Medical management of chronic medical conditions including recent right scapula fracture as well as right pubic fractures-history of dementia-hypertension-insomnia-depression-GERD-overactive bladder-  HPI:  Pt is a 79 y.o. female seen today for medical management of chronic diseases.  As noted above.  Patient is here for rehab after hospitalization for a fall with right pubic rami fractures as well as a right scapular fracture.  She appears to be doing quite well in this regards and actually seen orthopedics that she does have orders for weightbearing as tolerated and full range of motion of her right arm.  She also has a history of hypertension she is on labetalol 100 mg twice a day and Norvasc 10 mg a day-systolic blood pressures appear to run from the 1 10-1 40s-see 1  reading of 90/53 but this seems to be a machine abnormality-she.  She does not complain of any hypotension symptoms.  She does have a history of dementia with behaviors she is on Aricept 10 mg a day and this has been stable during her stay here.  Regards to depression she is on Effexor 225 mg a day and this appears to be stable she appears to be in good spirits today she says she would like to go home.  She also has a history of insomnia she is on Seroquel 25 mg nightly I suspect this is also for some history of behaviors in the past.  She has trazodone at night.  Currently she is resting in bed comfortably she is bright and alert-she is hard of hearing but quite pleasant and cooperative.  She does not really have any complaints but does state a desire to go home   Past Medical History:  Diagnosis Date  . Anxiety   . Arthritis   . COPD (chronic obstructive pulmonary disease) ([place])   . Hyperlipidemia   . Hypertension   . Hypothyroid   . Insomnia   . Reflux   . Repeated falls   . Skin cancer   . Stress   . Stroke (cerebrum) [facility])    Past Surgical History:  Procedure Laterality Date  . COLONOSCOPY     With polyp removal   . MANDIBLE FRACTURE SURGERY     Placement of partial jaw due to abcess  . SKIN CANCER EXCISION     Removal of several skin  cancers  . TOE SURGERY Right    Removal of toe     Allergies  Allergen Reactions  . Penicillins Hives and Other (See Comments)    Hives and swelling as a child Has patient had a PCN reaction causing immediate rash, facial/tongue/throat swelling, SOB or lightheadedness with hypotension: Yes Has patient had a PCN reaction causing severe rash involving mucus membranes or skin necrosis: No Has patient had a PCN reaction that required hospitalization No Has patient had a PCN reaction occurring within the last 10 years: No If all of the above answers are "NO", then may proceed with Cephalosporin use.   . Ceftin [Cefuroxime Axetil]  Other (See Comments)    In Epic since 2017, so leaving this (allergy not noted on MAR in 2021??)  . Simvastatin Other (See Comments)    In Epic since 2017, so leaving this (allergy not noted on MAR in 2021??)    Allergies as of 02/14/2020      Reactions   Penicillins Hives, Other (See Comments)   Hives and swelling as a child Has patient had a PCN reaction causing immediate rash, facial/tongue/throat swelling, SOB or lightheadedness with hypotension: Yes Has patient had a PCN reaction causing severe rash involving mucus membranes or skin necrosis: No Has patient had a PCN reaction that required hospitalization No Has patient had a PCN reaction occurring within the last 10 years: No If all of the above answers are "NO", then may proceed with Cephalosporin use.   Ceftin [cefuroxime Axetil] Other (See Comments)   In Epic since 2017, so leaving this (allergy not noted on MAR in 2021??)   Simvastatin Other (See Comments)   In Epic since 2017, so leaving this (allergy not noted on MAR in 2021??)      Medication List       Accurate as of February 14, 2020  9:09 AM. If you have any questions, ask your nurse or doctor.        STOP taking these medications   hydrOXYzine 50 MG tablet Commonly known as: ATARAX/VISTARIL Stopped by: Granville Lewis, PA-C     TAKE these medications   acetaminophen 500 MG tablet Commonly known as: TYLENOL Take 1,000 mg by mouth two times a day   amLODipine 10 MG tablet Commonly known as: NORVASC Take 10 mg by mouth daily.   atorvastatin 20 MG tablet Commonly known as: LIPITOR Take 20 mg by mouth daily.   bisacodyl 10 MG suppository Commonly known as: DULCOLAX If not relieved by MOM, give 10 mg Bisacodyl suppositiory rectally X 1 dose in 24 hours as needed (Do not use constipation standing orders for residents with renal failure/CFR less than 30. Contact MD for orders) (Physician Order)   donepezil 10 MG tablet Commonly known as: ARICEPT Take 1 tablet  (10 mg total) daily by mouth. After meal   labetalol 100 MG tablet Commonly known as: NORMODYNE Take 100 mg by mouth 2 (two) times daily.   loperamide 2 MG capsule Commonly known as: IMODIUM Take 2 mg by mouth daily as needed (for diarrhea).   loratadine 10 MG tablet Commonly known as: CLARITIN Take 10 mg by mouth daily.   magnesium hydroxide 400 MG/5ML suspension Commonly known as: MILK OF MAGNESIA If no BM in 3 days, give 30 cc Milk of Magnesium p.o. x 1 dose in 24 hours as needed (Do not use standing constipation orders for residents with renal failure CFR less than 30. Contact MD for orders) (Physician Order)  nicotine 21 mg/24hr patch Commonly known as: NICODERM CQ - dosed in mg/24 hours Place 1 patch (21 mg total) onto the skin daily.   omeprazole 40 MG capsule Commonly known as: PRILOSEC Take 40 mg by mouth daily before breakfast.   QUEtiapine 25 MG tablet Commonly known as: SEROQUEL Take 25 mg by mouth every evening.   RA SALINE ENEMA RE If not relieved by Biscodyl suppository, give disposable Saline Enema rectally X 1 dose/24 hrs as needed (Do not use constipation standing orders for residents with renal failure/CFR less than 30. Contact MD for orders)(Physician Or   sodium chloride 0.65 % Soln nasal spray Commonly known as: OCEAN Place 2 sprays into both nostrils daily as needed (for dryness).   traZODone 100 MG tablet Commonly known as: DESYREL Take 200 mg by mouth at bedtime.   trolamine salicylate 10 % cream Commonly known as: ASPERCREME Apply 1 application topically See admin instructions. Apply to both hands at bedtime   Trospium Chloride 60 MG Cp24 Take 60 mg by mouth daily.   Venlafaxine HCl 75 MG Tb24 Take 225 mg by mouth every morning.       Review of Systems   General she is not complaining of any fever or chills.  Skin does not complain of rashes or itching.  Head ears eyes nose mouth and throat does not complain of visual changes or  sore throat she is hard of hearing.  Respiratory is not complain of being short of breath or having a cough.  Cardiac is not complaining of chest pain.  Does not have significant edema.  GI is not complaining of abdominal discomfort nausea vomiting diarrhea constipation.  GU no complaints of dysuria.  Musculoskeletal she is not really complaining of any pain today.  Neurologic does not complain of dizziness headache numbness or syncope.  And psych does have a history of dementia but this appears to be fairly mild-this is somewhat complicated because she is very hard of hearing.    Immunization History  Administered Date(s) Administered  . Influenza, High Dose Seasonal PF 10/23/2017  . Influenza-Unspecified 10/31/2015  . PPD Test 01/04/2016, 01/18/2016  . Pneumococcal-Unspecified 01/11/2016  . Td 07/29/2014   Pertinent  Health Maintenance Due  Topic Date Due  . DEXA SCAN  05/14/2008  . PNA vac Low Risk Adult (2 of 2 - PCV13) 01/10/2017  . INFLUENZA VACCINE  07/31/2019   No flowsheet data found. Functional Status Survey:    Vitals:   02/14/20 0901  BP: (!) 90/53  Pulse: 61  Resp: 18  Temp: (!) 97.3 F (36.3 C)  TempSrc: Oral  SpO2: 93%  Weight: 122 lb 12.8 oz (55.7 kg)  Height: 5\' 5"  (1.651 m)   Body mass index is 20.43 kg/m. Physical Exam   In general this is a pleasant elderly female no distress lying comfortably in bed.  Her skin is warm and dry.  Eyes visual acuity appears to be intact sclera and conjunctive are clear.  Ears she is  hard of hearing  Oropharynx is clear mucous membranes moist.  Chest is clear to auscultation there is no labored breathing.  Heart is regular rate and rhythm with a systolic murmur-she does not have significant lower extremity edema.  Abdomen is soft nontender with positive bowel sounds.  Musculoskeletal is able to move all extremities x4 it appears with out significant discomfort-.  Neurologic is grossly intact  her speech is clear there are no lateralizing findings.  Psych she is oriented to  self she is pleasant appropriate-she is quite hard of hearing  Labs reviewed:  February 01, 2020.  WBC 4.9 hemoglobin 12.8 platelets 202.  Sodium 140 potassium 3.9 BUN 18.1 creatinine 0.42.   Recent Labs    01/19/20 0215 01/20/20 0236 01/21/20 0515  NA 136 136 135  K 3.7 3.5 3.9  CL 104 105 102  CO2 23 23 23   GLUCOSE 99 115* 123*  BUN 12 19 21   CREATININE 0.58 0.66 0.60  CALCIUM 8.8* 8.8* 8.5*   Recent Labs    01/18/20 1413  AST 19  ALT 16  ALKPHOS 100  BILITOT 0.7  PROT 6.6  ALBUMIN 3.9   Recent Labs    01/18/20 1413 01/20/20 0236 01/21/20 0515  WBC 7.0 5.5 5.7  NEUTROABS 4.7  --   --   HGB 14.1 12.6 12.4  HCT 43.1 38.5 37.5  MCV 90.7 90.0 89.7  PLT 157 132* 125*   Lab Results  Component Value Date   TSH 1.047 01/18/2020   Lab Results  Component Value Date   HGBA1C 5.1 01/02/2016   Lab Results  Component Value Date   CHOL 279 (A) 01/02/2016   HDL 49 01/02/2016   LDLCALC 201 01/02/2016   TRIG 149 01/02/2016    Significant Diagnostic Results in last 30 days:  DG Chest 1 View  Result Date: 01/18/2020 CLINICAL DATA:  Fall, back pain, shortness of breath EXAM: CHEST  1 VIEW COMPARISON:  06/18/2017 FINDINGS: The heart size and mediastinal contours are within normal limits. Opacity with bubbly internal contents at the medial right lung base compatible with known moderate-sized hiatal hernia. Both lungs are otherwise clear. No pleural effusion or pneumothorax. Abnormal contour of the inferior aspect of the right glenoid neck concerning for fracture. IMPRESSION: 1. No active cardiopulmonary disease. 2. Abnormal cortical contour of the right scapula at the inferior glenoid neck concerning for fracture. 3. Hiatal hernia. Electronically Signed   By: Davina Poke D.O.   On: 01/18/2020 15:47   DG Shoulder Right  Result Date: 01/18/2020 CLINICAL DATA:  Shoulder pain after  fall EXAM: RIGHT SHOULDER - 2+ VIEW COMPARISON:  07/16/2016 FINDINGS: Cortical irregularity with subtle angulation of the right scapula at the inferior glenoid neck, which was slightly more conspicuous on the frontal chest radiograph which was obtained concurrently. Glenohumeral joint is intact without fracture or dislocation. AC joint is intact. Soft tissues appear within normal limits. IMPRESSION: Cortical irregularity with subtle angulation of the right scapula at the inferior glenoid neck, which raises suspicion for a nondisplaced fracture. If further evaluation of this area is clinically warranted, CT can be performed. Electronically Signed   By: Davina Poke D.O.   On: 01/18/2020 15:51   CT Cervical Spine Wo Contrast  Result Date: 01/18/2020 CLINICAL DATA:  79 year old female with trauma. EXAM: CT CERVICAL SPINE WITHOUT CONTRAST TECHNIQUE: Multidetector CT imaging of the cervical spine was performed without intravenous contrast. Multiplanar CT image reconstructions were also generated. COMPARISON:  CT of the cervical spine dated 08/08/2018. FINDINGS: Evaluation of this exam is limited due to motion artifact. Alignment: No acute subluxation.  Grade 1 C4-C5 anterolisthesis. Skull base and vertebrae: No acute fracture.  Osteopenia. Soft tissues and spinal canal: No prevertebral fluid or swelling. No visible canal hematoma. Disc levels: Multilevel degenerative changes with endplate irregularity and disc space narrowing. There is multilevel facet arthropathy Upper chest: Mild emphysema. Other: Bilateral carotid bulb calcified plaques. IMPRESSION: 1. No definite acute/traumatic cervical spine pathology. 2. Multiple degenerative changes. Electronically  Signed   By: Anner Crete M.D.   On: 01/18/2020 15:37   DG Hip Unilat With Pelvis 2-3 Views Right  Result Date: 01/18/2020 CLINICAL DATA:  Fall, right hip pain EXAM: DG HIP (WITH OR WITHOUT PELVIS) 2-3V RIGHT COMPARISON:  CT 10/25/2016 FINDINGS: Mildly  displaced fractures of the right superior and inferior pubic rami, which are new from prior. Pubic symphysis is intact without diastasis. SI joints are intact. Right hip joint is intact without fracture or dislocation. Bones are demineralized. Vascular calcifications. IMPRESSION: Mildly displaced fractures of the right superior and inferior pubic rami, which are new from prior. Electronically Signed   By: Davina Poke D.O.   On: 01/18/2020 15:42    Assessment/Plan  #1 history of scapular fracture on the right with pubic rami fractures as well-she appears to be doing quite well with her recovery-receiving PT and OT she has gotten a good report from orthopedics with orders for weightbearing as tolerated-and full range of motion of the right arm without restrictions.  She is not really complaining of pain she does have orders for Tylenol.  2.  History of hypertension as noted above this appears to be stable on labetalol 100 mg twice daily and Norvasc 10 mg a day with systolics largely in the [id number] range.  3.  History of dementia with behaviors-this apparently has been quite stable during her stay here she is on Aricept 10 mg a day also Seroquel 25 mg nightly-nursing does not report any behaviors.  4.  History of depression this appears stable on Effexor 225 mg a day.  She also continues on trazodone 200 mg nightly for insomnia.  Apparently this is well-tolerated.  5.  History of GERD she is on Prilosec 40 mg a day this appears to be effective.  6.  History of overactive bladder she continues on trospium 60 mg a day this apparently has been stable.  7.  History of hyperlipidemia not stated as uncontrolled she is on atorvastatin 20 mg a day.  [id number]

## 2020-02-15 ENCOUNTER — Encounter: Payer: Self-pay | Admitting: Internal Medicine

## 2020-02-19 DIAGNOSIS — F17210 Nicotine dependence, cigarettes, uncomplicated: Secondary | ICD-10-CM | POA: Diagnosis not present

## 2020-02-19 DIAGNOSIS — J449 Chronic obstructive pulmonary disease, unspecified: Secondary | ICD-10-CM | POA: Diagnosis not present

## 2020-02-19 DIAGNOSIS — S32591D Other specified fracture of right pubis, subsequent encounter for fracture with routine healing: Secondary | ICD-10-CM | POA: Diagnosis not present

## 2020-02-19 DIAGNOSIS — Z9181 History of falling: Secondary | ICD-10-CM | POA: Diagnosis not present

## 2020-02-19 DIAGNOSIS — W010XXD Fall on same level from slipping, tripping and stumbling without subsequent striking against object, subsequent encounter: Secondary | ICD-10-CM | POA: Diagnosis not present

## 2020-02-19 DIAGNOSIS — Z8673 Personal history of transient ischemic attack (TIA), and cerebral infarction without residual deficits: Secondary | ICD-10-CM | POA: Diagnosis not present

## 2020-02-19 DIAGNOSIS — M479 Spondylosis, unspecified: Secondary | ICD-10-CM | POA: Diagnosis not present

## 2020-02-19 DIAGNOSIS — F0281 Dementia in other diseases classified elsewhere with behavioral disturbance: Secondary | ICD-10-CM | POA: Diagnosis not present

## 2020-02-19 DIAGNOSIS — I10 Essential (primary) hypertension: Secondary | ICD-10-CM | POA: Diagnosis not present

## 2020-02-19 DIAGNOSIS — E039 Hypothyroidism, unspecified: Secondary | ICD-10-CM | POA: Diagnosis not present

## 2020-02-19 DIAGNOSIS — F339 Major depressive disorder, recurrent, unspecified: Secondary | ICD-10-CM | POA: Diagnosis not present

## 2020-02-19 DIAGNOSIS — S42101D Fracture of unspecified part of scapula, right shoulder, subsequent encounter for fracture with routine healing: Secondary | ICD-10-CM | POA: Diagnosis not present

## 2020-02-19 DIAGNOSIS — E785 Hyperlipidemia, unspecified: Secondary | ICD-10-CM | POA: Diagnosis not present

## 2020-02-22 DIAGNOSIS — F0281 Dementia in other diseases classified elsewhere with behavioral disturbance: Secondary | ICD-10-CM | POA: Diagnosis not present

## 2020-02-22 DIAGNOSIS — S42101D Fracture of unspecified part of scapula, right shoulder, subsequent encounter for fracture with routine healing: Secondary | ICD-10-CM | POA: Diagnosis not present

## 2020-02-22 DIAGNOSIS — W010XXD Fall on same level from slipping, tripping and stumbling without subsequent striking against object, subsequent encounter: Secondary | ICD-10-CM | POA: Diagnosis not present

## 2020-02-22 DIAGNOSIS — I10 Essential (primary) hypertension: Secondary | ICD-10-CM | POA: Diagnosis not present

## 2020-02-22 DIAGNOSIS — S32591D Other specified fracture of right pubis, subsequent encounter for fracture with routine healing: Secondary | ICD-10-CM | POA: Diagnosis not present

## 2020-02-22 DIAGNOSIS — E039 Hypothyroidism, unspecified: Secondary | ICD-10-CM | POA: Diagnosis not present

## 2020-02-23 ENCOUNTER — Other Ambulatory Visit: Payer: Self-pay | Admitting: *Deleted

## 2020-02-23 DIAGNOSIS — S32591D Other specified fracture of right pubis, subsequent encounter for fracture with routine healing: Secondary | ICD-10-CM | POA: Diagnosis not present

## 2020-02-23 DIAGNOSIS — S42101D Fracture of unspecified part of scapula, right shoulder, subsequent encounter for fracture with routine healing: Secondary | ICD-10-CM | POA: Diagnosis not present

## 2020-02-23 DIAGNOSIS — E039 Hypothyroidism, unspecified: Secondary | ICD-10-CM | POA: Diagnosis not present

## 2020-02-23 DIAGNOSIS — W010XXD Fall on same level from slipping, tripping and stumbling without subsequent striking against object, subsequent encounter: Secondary | ICD-10-CM | POA: Diagnosis not present

## 2020-02-23 DIAGNOSIS — I10 Essential (primary) hypertension: Secondary | ICD-10-CM | POA: Diagnosis not present

## 2020-02-23 DIAGNOSIS — F0281 Dementia in other diseases classified elsewhere with behavioral disturbance: Secondary | ICD-10-CM | POA: Diagnosis not present

## 2020-02-23 NOTE — Patient Outreach (Signed)
Member screened for potential [facility] Care Management needs as a benefit of [place] [employer].  Verified in Patient [place] member discharged from [facility] on 02/17/20. Member returned to ALF.  No identifiable [facility] needs at this time.   Marthenia Rolling, MSN-Ed, RN,BSN [facility] Acute Care Coordinator [phone] [facility]) [phone]  (Toll free office)

## 2020-02-24 DIAGNOSIS — W010XXD Fall on same level from slipping, tripping and stumbling without subsequent striking against object, subsequent encounter: Secondary | ICD-10-CM | POA: Diagnosis not present

## 2020-02-24 DIAGNOSIS — S42101D Fracture of unspecified part of scapula, right shoulder, subsequent encounter for fracture with routine healing: Secondary | ICD-10-CM | POA: Diagnosis not present

## 2020-02-24 DIAGNOSIS — F0281 Dementia in other diseases classified elsewhere with behavioral disturbance: Secondary | ICD-10-CM | POA: Diagnosis not present

## 2020-02-24 DIAGNOSIS — E039 Hypothyroidism, unspecified: Secondary | ICD-10-CM | POA: Diagnosis not present

## 2020-02-24 DIAGNOSIS — I10 Essential (primary) hypertension: Secondary | ICD-10-CM | POA: Diagnosis not present

## 2020-02-24 DIAGNOSIS — S32591D Other specified fracture of right pubis, subsequent encounter for fracture with routine healing: Secondary | ICD-10-CM | POA: Diagnosis not present

## 2020-02-28 DIAGNOSIS — I10 Essential (primary) hypertension: Secondary | ICD-10-CM | POA: Diagnosis not present

## 2020-02-28 DIAGNOSIS — S32599A Other specified fracture of unspecified pubis, initial encounter for closed fracture: Secondary | ICD-10-CM | POA: Diagnosis not present

## 2020-02-28 DIAGNOSIS — E7800 Pure hypercholesterolemia, unspecified: Secondary | ICD-10-CM | POA: Diagnosis not present

## 2020-02-28 DIAGNOSIS — F0281 Dementia in other diseases classified elsewhere with behavioral disturbance: Secondary | ICD-10-CM | POA: Diagnosis not present

## 2020-02-28 DIAGNOSIS — S32591D Other specified fracture of right pubis, subsequent encounter for fracture with routine healing: Secondary | ICD-10-CM | POA: Diagnosis not present

## 2020-02-28 DIAGNOSIS — F0390 Unspecified dementia without behavioral disturbance: Secondary | ICD-10-CM | POA: Diagnosis not present

## 2020-02-28 DIAGNOSIS — F322 Major depressive disorder, single episode, severe without psychotic features: Secondary | ICD-10-CM | POA: Diagnosis not present

## 2020-02-28 DIAGNOSIS — E039 Hypothyroidism, unspecified: Secondary | ICD-10-CM | POA: Diagnosis not present

## 2020-02-28 DIAGNOSIS — W010XXD Fall on same level from slipping, tripping and stumbling without subsequent striking against object, subsequent encounter: Secondary | ICD-10-CM | POA: Diagnosis not present

## 2020-02-28 DIAGNOSIS — S42101D Fracture of unspecified part of scapula, right shoulder, subsequent encounter for fracture with routine healing: Secondary | ICD-10-CM | POA: Diagnosis not present

## 2020-03-03 DIAGNOSIS — I10 Essential (primary) hypertension: Secondary | ICD-10-CM | POA: Diagnosis not present

## 2020-03-03 DIAGNOSIS — W010XXD Fall on same level from slipping, tripping and stumbling without subsequent striking against object, subsequent encounter: Secondary | ICD-10-CM | POA: Diagnosis not present

## 2020-03-03 DIAGNOSIS — E039 Hypothyroidism, unspecified: Secondary | ICD-10-CM | POA: Diagnosis not present

## 2020-03-03 DIAGNOSIS — S42101D Fracture of unspecified part of scapula, right shoulder, subsequent encounter for fracture with routine healing: Secondary | ICD-10-CM | POA: Diagnosis not present

## 2020-03-03 DIAGNOSIS — S32591D Other specified fracture of right pubis, subsequent encounter for fracture with routine healing: Secondary | ICD-10-CM | POA: Diagnosis not present

## 2020-03-03 DIAGNOSIS — F0281 Dementia in other diseases classified elsewhere with behavioral disturbance: Secondary | ICD-10-CM | POA: Diagnosis not present

## 2020-03-06 DIAGNOSIS — W010XXD Fall on same level from slipping, tripping and stumbling without subsequent striking against object, subsequent encounter: Secondary | ICD-10-CM | POA: Diagnosis not present

## 2020-03-06 DIAGNOSIS — E039 Hypothyroidism, unspecified: Secondary | ICD-10-CM | POA: Diagnosis not present

## 2020-03-06 DIAGNOSIS — F0281 Dementia in other diseases classified elsewhere with behavioral disturbance: Secondary | ICD-10-CM | POA: Diagnosis not present

## 2020-03-06 DIAGNOSIS — S32591D Other specified fracture of right pubis, subsequent encounter for fracture with routine healing: Secondary | ICD-10-CM | POA: Diagnosis not present

## 2020-03-06 DIAGNOSIS — I10 Essential (primary) hypertension: Secondary | ICD-10-CM | POA: Diagnosis not present

## 2020-03-06 DIAGNOSIS — S42101D Fracture of unspecified part of scapula, right shoulder, subsequent encounter for fracture with routine healing: Secondary | ICD-10-CM | POA: Diagnosis not present

## 2020-03-10 DIAGNOSIS — S42101D Fracture of unspecified part of scapula, right shoulder, subsequent encounter for fracture with routine healing: Secondary | ICD-10-CM | POA: Diagnosis not present

## 2020-03-10 DIAGNOSIS — I10 Essential (primary) hypertension: Secondary | ICD-10-CM | POA: Diagnosis not present

## 2020-03-10 DIAGNOSIS — W010XXD Fall on same level from slipping, tripping and stumbling without subsequent striking against object, subsequent encounter: Secondary | ICD-10-CM | POA: Diagnosis not present

## 2020-03-10 DIAGNOSIS — E039 Hypothyroidism, unspecified: Secondary | ICD-10-CM | POA: Diagnosis not present

## 2020-03-10 DIAGNOSIS — S32591D Other specified fracture of right pubis, subsequent encounter for fracture with routine healing: Secondary | ICD-10-CM | POA: Diagnosis not present

## 2020-03-10 DIAGNOSIS — F0281 Dementia in other diseases classified elsewhere with behavioral disturbance: Secondary | ICD-10-CM | POA: Diagnosis not present

## 2020-03-14 DIAGNOSIS — E039 Hypothyroidism, unspecified: Secondary | ICD-10-CM | POA: Diagnosis not present

## 2020-03-14 DIAGNOSIS — S42101D Fracture of unspecified part of scapula, right shoulder, subsequent encounter for fracture with routine healing: Secondary | ICD-10-CM | POA: Diagnosis not present

## 2020-03-14 DIAGNOSIS — I10 Essential (primary) hypertension: Secondary | ICD-10-CM | POA: Diagnosis not present

## 2020-03-14 DIAGNOSIS — S32591D Other specified fracture of right pubis, subsequent encounter for fracture with routine healing: Secondary | ICD-10-CM | POA: Diagnosis not present

## 2020-03-14 DIAGNOSIS — F0281 Dementia in other diseases classified elsewhere with behavioral disturbance: Secondary | ICD-10-CM | POA: Diagnosis not present

## 2020-03-14 DIAGNOSIS — W010XXD Fall on same level from slipping, tripping and stumbling without subsequent striking against object, subsequent encounter: Secondary | ICD-10-CM | POA: Diagnosis not present

## 2020-03-16 DIAGNOSIS — E039 Hypothyroidism, unspecified: Secondary | ICD-10-CM | POA: Diagnosis not present

## 2020-03-16 DIAGNOSIS — F0281 Dementia in other diseases classified elsewhere with behavioral disturbance: Secondary | ICD-10-CM | POA: Diagnosis not present

## 2020-03-16 DIAGNOSIS — W010XXD Fall on same level from slipping, tripping and stumbling without subsequent striking against object, subsequent encounter: Secondary | ICD-10-CM | POA: Diagnosis not present

## 2020-03-16 DIAGNOSIS — I10 Essential (primary) hypertension: Secondary | ICD-10-CM | POA: Diagnosis not present

## 2020-03-16 DIAGNOSIS — S32591D Other specified fracture of right pubis, subsequent encounter for fracture with routine healing: Secondary | ICD-10-CM | POA: Diagnosis not present

## 2020-03-16 DIAGNOSIS — S42101D Fracture of unspecified part of scapula, right shoulder, subsequent encounter for fracture with routine healing: Secondary | ICD-10-CM | POA: Diagnosis not present

## 2020-03-20 DIAGNOSIS — F0281 Dementia in other diseases classified elsewhere with behavioral disturbance: Secondary | ICD-10-CM | POA: Diagnosis not present

## 2020-03-20 DIAGNOSIS — Z9181 History of falling: Secondary | ICD-10-CM | POA: Diagnosis not present

## 2020-03-20 DIAGNOSIS — F17210 Nicotine dependence, cigarettes, uncomplicated: Secondary | ICD-10-CM | POA: Diagnosis not present

## 2020-03-20 DIAGNOSIS — E039 Hypothyroidism, unspecified: Secondary | ICD-10-CM | POA: Diagnosis not present

## 2020-03-20 DIAGNOSIS — Z8673 Personal history of transient ischemic attack (TIA), and cerebral infarction without residual deficits: Secondary | ICD-10-CM | POA: Diagnosis not present

## 2020-03-20 DIAGNOSIS — S32591D Other specified fracture of right pubis, subsequent encounter for fracture with routine healing: Secondary | ICD-10-CM | POA: Diagnosis not present

## 2020-03-20 DIAGNOSIS — F339 Major depressive disorder, recurrent, unspecified: Secondary | ICD-10-CM | POA: Diagnosis not present

## 2020-03-20 DIAGNOSIS — I10 Essential (primary) hypertension: Secondary | ICD-10-CM | POA: Diagnosis not present

## 2020-03-20 DIAGNOSIS — S42101D Fracture of unspecified part of scapula, right shoulder, subsequent encounter for fracture with routine healing: Secondary | ICD-10-CM | POA: Diagnosis not present

## 2020-03-20 DIAGNOSIS — W010XXD Fall on same level from slipping, tripping and stumbling without subsequent striking against object, subsequent encounter: Secondary | ICD-10-CM | POA: Diagnosis not present

## 2020-03-20 DIAGNOSIS — J449 Chronic obstructive pulmonary disease, unspecified: Secondary | ICD-10-CM | POA: Diagnosis not present

## 2020-03-20 DIAGNOSIS — E785 Hyperlipidemia, unspecified: Secondary | ICD-10-CM | POA: Diagnosis not present

## 2020-03-20 DIAGNOSIS — M479 Spondylosis, unspecified: Secondary | ICD-10-CM | POA: Diagnosis not present

## 2020-03-22 DIAGNOSIS — I10 Essential (primary) hypertension: Secondary | ICD-10-CM | POA: Diagnosis not present

## 2020-03-22 DIAGNOSIS — F0281 Dementia in other diseases classified elsewhere with behavioral disturbance: Secondary | ICD-10-CM | POA: Diagnosis not present

## 2020-03-22 DIAGNOSIS — S42101D Fracture of unspecified part of scapula, right shoulder, subsequent encounter for fracture with routine healing: Secondary | ICD-10-CM | POA: Diagnosis not present

## 2020-03-22 DIAGNOSIS — E039 Hypothyroidism, unspecified: Secondary | ICD-10-CM | POA: Diagnosis not present

## 2020-03-22 DIAGNOSIS — S32591D Other specified fracture of right pubis, subsequent encounter for fracture with routine healing: Secondary | ICD-10-CM | POA: Diagnosis not present

## 2020-03-22 DIAGNOSIS — W010XXD Fall on same level from slipping, tripping and stumbling without subsequent striking against object, subsequent encounter: Secondary | ICD-10-CM | POA: Diagnosis not present

## 2020-03-29 DIAGNOSIS — S32591D Other specified fracture of right pubis, subsequent encounter for fracture with routine healing: Secondary | ICD-10-CM | POA: Diagnosis not present

## 2020-03-29 DIAGNOSIS — S42101D Fracture of unspecified part of scapula, right shoulder, subsequent encounter for fracture with routine healing: Secondary | ICD-10-CM | POA: Diagnosis not present

## 2020-03-29 DIAGNOSIS — E039 Hypothyroidism, unspecified: Secondary | ICD-10-CM | POA: Diagnosis not present

## 2020-03-29 DIAGNOSIS — I10 Essential (primary) hypertension: Secondary | ICD-10-CM | POA: Diagnosis not present

## 2020-03-29 DIAGNOSIS — W010XXD Fall on same level from slipping, tripping and stumbling without subsequent striking against object, subsequent encounter: Secondary | ICD-10-CM | POA: Diagnosis not present

## 2020-03-29 DIAGNOSIS — F0281 Dementia in other diseases classified elsewhere with behavioral disturbance: Secondary | ICD-10-CM | POA: Diagnosis not present

## 2020-04-05 DIAGNOSIS — I10 Essential (primary) hypertension: Secondary | ICD-10-CM | POA: Diagnosis not present

## 2020-04-05 DIAGNOSIS — S42101D Fracture of unspecified part of scapula, right shoulder, subsequent encounter for fracture with routine healing: Secondary | ICD-10-CM | POA: Diagnosis not present

## 2020-04-05 DIAGNOSIS — W010XXD Fall on same level from slipping, tripping and stumbling without subsequent striking against object, subsequent encounter: Secondary | ICD-10-CM | POA: Diagnosis not present

## 2020-04-05 DIAGNOSIS — E039 Hypothyroidism, unspecified: Secondary | ICD-10-CM | POA: Diagnosis not present

## 2020-04-05 DIAGNOSIS — S32591D Other specified fracture of right pubis, subsequent encounter for fracture with routine healing: Secondary | ICD-10-CM | POA: Diagnosis not present

## 2020-04-05 DIAGNOSIS — F0281 Dementia in other diseases classified elsewhere with behavioral disturbance: Secondary | ICD-10-CM | POA: Diagnosis not present

## 2020-04-12 DIAGNOSIS — F0281 Dementia in other diseases classified elsewhere with behavioral disturbance: Secondary | ICD-10-CM | POA: Diagnosis not present

## 2020-04-12 DIAGNOSIS — S42101D Fracture of unspecified part of scapula, right shoulder, subsequent encounter for fracture with routine healing: Secondary | ICD-10-CM | POA: Diagnosis not present

## 2020-04-12 DIAGNOSIS — S32591D Other specified fracture of right pubis, subsequent encounter for fracture with routine healing: Secondary | ICD-10-CM | POA: Diagnosis not present

## 2020-04-12 DIAGNOSIS — E039 Hypothyroidism, unspecified: Secondary | ICD-10-CM | POA: Diagnosis not present

## 2020-04-12 DIAGNOSIS — I10 Essential (primary) hypertension: Secondary | ICD-10-CM | POA: Diagnosis not present

## 2020-04-12 DIAGNOSIS — W010XXD Fall on same level from slipping, tripping and stumbling without subsequent striking against object, subsequent encounter: Secondary | ICD-10-CM | POA: Diagnosis not present

## 2020-06-01 DIAGNOSIS — M79673 Pain in unspecified foot: Secondary | ICD-10-CM | POA: Diagnosis not present

## 2020-06-01 DIAGNOSIS — L605 Yellow nail syndrome: Secondary | ICD-10-CM | POA: Diagnosis not present

## 2020-06-01 DIAGNOSIS — I639 Cerebral infarction, unspecified: Secondary | ICD-10-CM | POA: Diagnosis not present

## 2020-06-01 DIAGNOSIS — R2689 Other abnormalities of gait and mobility: Secondary | ICD-10-CM | POA: Diagnosis not present

## 2020-06-01 DIAGNOSIS — B351 Tinea unguium: Secondary | ICD-10-CM | POA: Diagnosis not present

## 2020-06-01 DIAGNOSIS — L84 Corns and callosities: Secondary | ICD-10-CM | POA: Diagnosis not present

## 2020-08-03 DIAGNOSIS — R2689 Other abnormalities of gait and mobility: Secondary | ICD-10-CM | POA: Diagnosis not present

## 2020-08-03 DIAGNOSIS — B351 Tinea unguium: Secondary | ICD-10-CM | POA: Diagnosis not present

## 2020-08-03 DIAGNOSIS — B070 Plantar wart: Secondary | ICD-10-CM | POA: Diagnosis not present

## 2020-08-03 DIAGNOSIS — L603 Nail dystrophy: Secondary | ICD-10-CM | POA: Diagnosis not present

## 2020-08-03 DIAGNOSIS — M19079 Primary osteoarthritis, unspecified ankle and foot: Secondary | ICD-10-CM | POA: Diagnosis not present

## 2020-08-03 DIAGNOSIS — M79673 Pain in unspecified foot: Secondary | ICD-10-CM | POA: Diagnosis not present

## 2020-09-14 DIAGNOSIS — E7800 Pure hypercholesterolemia, unspecified: Secondary | ICD-10-CM | POA: Diagnosis not present

## 2020-09-14 DIAGNOSIS — Z23 Encounter for immunization: Secondary | ICD-10-CM | POA: Diagnosis not present

## 2020-09-14 DIAGNOSIS — E039 Hypothyroidism, unspecified: Secondary | ICD-10-CM | POA: Diagnosis not present

## 2020-09-14 DIAGNOSIS — I10 Essential (primary) hypertension: Secondary | ICD-10-CM | POA: Diagnosis not present

## 2020-09-14 DIAGNOSIS — L989 Disorder of the skin and subcutaneous tissue, unspecified: Secondary | ICD-10-CM | POA: Diagnosis not present

## 2020-09-14 DIAGNOSIS — F17210 Nicotine dependence, cigarettes, uncomplicated: Secondary | ICD-10-CM | POA: Diagnosis not present

## 2020-09-14 DIAGNOSIS — Z0000 Encounter for general adult medical examination without abnormal findings: Secondary | ICD-10-CM | POA: Diagnosis not present

## 2020-09-14 DIAGNOSIS — F322 Major depressive disorder, single episode, severe without psychotic features: Secondary | ICD-10-CM | POA: Diagnosis not present

## 2020-09-14 DIAGNOSIS — L84 Corns and callosities: Secondary | ICD-10-CM | POA: Diagnosis not present

## 2020-09-14 DIAGNOSIS — I639 Cerebral infarction, unspecified: Secondary | ICD-10-CM | POA: Diagnosis not present

## 2020-10-13 DIAGNOSIS — D225 Melanocytic nevi of trunk: Secondary | ICD-10-CM | POA: Diagnosis not present

## 2020-10-13 DIAGNOSIS — L720 Epidermal cyst: Secondary | ICD-10-CM | POA: Diagnosis not present

## 2020-10-13 DIAGNOSIS — D485 Neoplasm of uncertain behavior of skin: Secondary | ICD-10-CM | POA: Diagnosis not present

## 2020-10-13 DIAGNOSIS — D1801 Hemangioma of skin and subcutaneous tissue: Secondary | ICD-10-CM | POA: Diagnosis not present

## 2020-10-13 DIAGNOSIS — C44319 Basal cell carcinoma of skin of other parts of face: Secondary | ICD-10-CM | POA: Diagnosis not present

## 2020-10-13 DIAGNOSIS — L570 Actinic keratosis: Secondary | ICD-10-CM | POA: Diagnosis not present

## 2020-10-13 DIAGNOSIS — C4441 Basal cell carcinoma of skin of scalp and neck: Secondary | ICD-10-CM | POA: Diagnosis not present

## 2020-10-25 DIAGNOSIS — M19079 Primary osteoarthritis, unspecified ankle and foot: Secondary | ICD-10-CM | POA: Diagnosis not present

## 2020-10-25 DIAGNOSIS — M79673 Pain in unspecified foot: Secondary | ICD-10-CM | POA: Diagnosis not present

## 2020-10-25 DIAGNOSIS — R2689 Other abnormalities of gait and mobility: Secondary | ICD-10-CM | POA: Diagnosis not present

## 2020-10-25 DIAGNOSIS — B351 Tinea unguium: Secondary | ICD-10-CM | POA: Diagnosis not present

## 2020-10-25 DIAGNOSIS — L851 Acquired keratosis [keratoderma] palmaris et plantaris: Secondary | ICD-10-CM | POA: Diagnosis not present

## 2020-10-25 DIAGNOSIS — L605 Yellow nail syndrome: Secondary | ICD-10-CM | POA: Diagnosis not present

## 2020-11-06 DIAGNOSIS — C4441 Basal cell carcinoma of skin of scalp and neck: Secondary | ICD-10-CM | POA: Diagnosis not present

## 2020-11-06 DIAGNOSIS — C44319 Basal cell carcinoma of skin of other parts of face: Secondary | ICD-10-CM | POA: Diagnosis not present

## 2020-11-07 DIAGNOSIS — Z23 Encounter for immunization: Secondary | ICD-10-CM | POA: Diagnosis not present

## 2020-11-08 ENCOUNTER — Other Ambulatory Visit: Payer: Self-pay

## 2020-11-08 ENCOUNTER — Ambulatory Visit (INDEPENDENT_AMBULATORY_CARE_PROVIDER_SITE_OTHER): Payer: Medicare Other | Admitting: Podiatry

## 2020-11-08 ENCOUNTER — Encounter: Payer: Self-pay | Admitting: Podiatry

## 2020-11-08 DIAGNOSIS — M2042 Other hammer toe(s) (acquired), left foot: Secondary | ICD-10-CM | POA: Diagnosis not present

## 2020-11-08 DIAGNOSIS — L84 Corns and callosities: Secondary | ICD-10-CM

## 2020-11-08 DIAGNOSIS — B351 Tinea unguium: Secondary | ICD-10-CM

## 2020-11-08 DIAGNOSIS — M79675 Pain in left toe(s): Secondary | ICD-10-CM | POA: Diagnosis not present

## 2020-11-08 DIAGNOSIS — M79674 Pain in right toe(s): Secondary | ICD-10-CM

## 2020-11-09 NOTE — Progress Notes (Signed)
Subjective:   Patient ID: Kendra Hernandez, female   DOB: 79 y.o.   MRN: [id number]   HPI Patient presents with caregiver with significant nail disease bilateral which becomes bothersome lesions and severe hammertoe deformity second left with history of amputation second right.  Patient is a relatively poor health and does smoke 2 packs of cigarettes today and is not active and wear slippers most of the time   Review of Systems  All other systems reviewed and are negative.       Objective:  Physical Exam Vitals and nursing note reviewed.  Constitutional:      Appearance: She is well-developed.  Pulmonary:     Effort: Pulmonary effort is normal.  Musculoskeletal:        General: Normal range of motion.  Skin:    General: Skin is warm.  Neurological:     Mental Status: She is alert.     Vascular status found to be intact neurological mildly diminished sharp dull vibratory.  Range of motion subtalar midtarsal joint adequate with diminished muscle strength bilateral.  Patient is found to have severe digital deformity second left with elevated completely detached second toe at the MPJ and has nail disease 1-5 both feet that are thick incurvated painful and she cannot cut herself.  Patient does have good digital perfusion fusion and I am able to hold a conversation with her but her caregiver does make most of her decisions     Assessment:  Significant structural deformity left with mycotic nail infection 1-5 both feet     Plan:  H&P reviewed condition and at this point working to just do conservative.  I did discuss amputation of the toe but I am not recommending it currently even though it may be necessary someday and today I debrided nailbeds 1-5 both feet no iatrogenic bleeding was noted and patient will be seen back routine care as needed and was given education today on daily inspections of feet

## 2020-11-28 DIAGNOSIS — F5105 Insomnia due to other mental disorder: Secondary | ICD-10-CM | POA: Diagnosis not present

## 2020-11-28 DIAGNOSIS — F331 Major depressive disorder, recurrent, moderate: Secondary | ICD-10-CM | POA: Diagnosis not present

## 2020-11-28 DIAGNOSIS — R419 Unspecified symptoms and signs involving cognitive functions and awareness: Secondary | ICD-10-CM | POA: Diagnosis not present

## 2020-12-04 DIAGNOSIS — C44319 Basal cell carcinoma of skin of other parts of face: Secondary | ICD-10-CM | POA: Diagnosis not present

## 2020-12-08 DIAGNOSIS — R351 Nocturia: Secondary | ICD-10-CM | POA: Diagnosis not present

## 2020-12-08 DIAGNOSIS — R3915 Urgency of urination: Secondary | ICD-10-CM | POA: Diagnosis not present

## 2020-12-08 DIAGNOSIS — N3941 Urge incontinence: Secondary | ICD-10-CM | POA: Diagnosis not present

## 2020-12-08 DIAGNOSIS — R8271 Bacteriuria: Secondary | ICD-10-CM | POA: Diagnosis not present

## 2020-12-26 DIAGNOSIS — F331 Major depressive disorder, recurrent, moderate: Secondary | ICD-10-CM | POA: Diagnosis not present

## 2020-12-26 DIAGNOSIS — R419 Unspecified symptoms and signs involving cognitive functions and awareness: Secondary | ICD-10-CM | POA: Diagnosis not present

## 2020-12-26 DIAGNOSIS — F5105 Insomnia due to other mental disorder: Secondary | ICD-10-CM | POA: Diagnosis not present

## 2020-12-28 DIAGNOSIS — L603 Nail dystrophy: Secondary | ICD-10-CM | POA: Diagnosis not present

## 2020-12-28 DIAGNOSIS — M19079 Primary osteoarthritis, unspecified ankle and foot: Secondary | ICD-10-CM | POA: Diagnosis not present

## 2020-12-28 DIAGNOSIS — R2689 Other abnormalities of gait and mobility: Secondary | ICD-10-CM | POA: Diagnosis not present

## 2020-12-28 DIAGNOSIS — B351 Tinea unguium: Secondary | ICD-10-CM | POA: Diagnosis not present

## 2020-12-28 DIAGNOSIS — L84 Corns and callosities: Secondary | ICD-10-CM | POA: Diagnosis not present

## 2020-12-28 DIAGNOSIS — M79673 Pain in unspecified foot: Secondary | ICD-10-CM | POA: Diagnosis not present

## 2021-01-29 DIAGNOSIS — R419 Unspecified symptoms and signs involving cognitive functions and awareness: Secondary | ICD-10-CM | POA: Diagnosis not present

## 2021-01-29 DIAGNOSIS — F5105 Insomnia due to other mental disorder: Secondary | ICD-10-CM | POA: Diagnosis not present

## 2021-01-29 DIAGNOSIS — F331 Major depressive disorder, recurrent, moderate: Secondary | ICD-10-CM | POA: Diagnosis not present

## 2021-02-26 DIAGNOSIS — F331 Major depressive disorder, recurrent, moderate: Secondary | ICD-10-CM | POA: Diagnosis not present

## 2021-02-26 DIAGNOSIS — F5105 Insomnia due to other mental disorder: Secondary | ICD-10-CM | POA: Diagnosis not present

## 2021-02-26 DIAGNOSIS — R419 Unspecified symptoms and signs involving cognitive functions and awareness: Secondary | ICD-10-CM | POA: Diagnosis not present

## 2021-03-27 DIAGNOSIS — F5105 Insomnia due to other mental disorder: Secondary | ICD-10-CM | POA: Diagnosis not present

## 2021-03-27 DIAGNOSIS — F331 Major depressive disorder, recurrent, moderate: Secondary | ICD-10-CM | POA: Diagnosis not present

## 2021-03-27 DIAGNOSIS — R419 Unspecified symptoms and signs involving cognitive functions and awareness: Secondary | ICD-10-CM | POA: Diagnosis not present

## 2021-03-28 DIAGNOSIS — F0390 Unspecified dementia without behavioral disturbance: Secondary | ICD-10-CM | POA: Diagnosis not present

## 2021-03-28 DIAGNOSIS — I10 Essential (primary) hypertension: Secondary | ICD-10-CM | POA: Diagnosis not present

## 2021-03-28 DIAGNOSIS — E7800 Pure hypercholesterolemia, unspecified: Secondary | ICD-10-CM | POA: Diagnosis not present

## 2021-03-28 DIAGNOSIS — R269 Unspecified abnormalities of gait and mobility: Secondary | ICD-10-CM | POA: Diagnosis not present

## 2021-03-28 DIAGNOSIS — Z23 Encounter for immunization: Secondary | ICD-10-CM | POA: Diagnosis not present

## 2021-03-28 DIAGNOSIS — R32 Unspecified urinary incontinence: Secondary | ICD-10-CM | POA: Diagnosis not present

## 2021-03-28 DIAGNOSIS — Z8673 Personal history of transient ischemic attack (TIA), and cerebral infarction without residual deficits: Secondary | ICD-10-CM | POA: Diagnosis not present

## 2021-03-28 DIAGNOSIS — E039 Hypothyroidism, unspecified: Secondary | ICD-10-CM | POA: Diagnosis not present

## 2021-03-28 DIAGNOSIS — F322 Major depressive disorder, single episode, severe without psychotic features: Secondary | ICD-10-CM | POA: Diagnosis not present

## 2021-04-24 DIAGNOSIS — R419 Unspecified symptoms and signs involving cognitive functions and awareness: Secondary | ICD-10-CM | POA: Diagnosis not present

## 2021-04-24 DIAGNOSIS — F5105 Insomnia due to other mental disorder: Secondary | ICD-10-CM | POA: Diagnosis not present

## 2021-04-24 DIAGNOSIS — F331 Major depressive disorder, recurrent, moderate: Secondary | ICD-10-CM | POA: Diagnosis not present

## 2021-05-28 DIAGNOSIS — R419 Unspecified symptoms and signs involving cognitive functions and awareness: Secondary | ICD-10-CM | POA: Diagnosis not present

## 2021-05-28 DIAGNOSIS — F331 Major depressive disorder, recurrent, moderate: Secondary | ICD-10-CM | POA: Diagnosis not present

## 2021-05-28 DIAGNOSIS — F5105 Insomnia due to other mental disorder: Secondary | ICD-10-CM | POA: Diagnosis not present

## 2021-06-04 DIAGNOSIS — I639 Cerebral infarction, unspecified: Secondary | ICD-10-CM | POA: Diagnosis not present

## 2021-06-04 DIAGNOSIS — F322 Major depressive disorder, single episode, severe without psychotic features: Secondary | ICD-10-CM | POA: Diagnosis not present

## 2021-06-04 DIAGNOSIS — I10 Essential (primary) hypertension: Secondary | ICD-10-CM | POA: Diagnosis not present

## 2021-06-04 DIAGNOSIS — E7800 Pure hypercholesterolemia, unspecified: Secondary | ICD-10-CM | POA: Diagnosis not present

## 2021-06-04 DIAGNOSIS — R32 Unspecified urinary incontinence: Secondary | ICD-10-CM | POA: Diagnosis not present

## 2021-06-04 DIAGNOSIS — F0390 Unspecified dementia without behavioral disturbance: Secondary | ICD-10-CM | POA: Diagnosis not present

## 2021-06-04 DIAGNOSIS — F17200 Nicotine dependence, unspecified, uncomplicated: Secondary | ICD-10-CM | POA: Diagnosis not present

## 2021-06-06 ENCOUNTER — Ambulatory Visit (INDEPENDENT_AMBULATORY_CARE_PROVIDER_SITE_OTHER): Payer: PPO | Admitting: Podiatry

## 2021-06-06 ENCOUNTER — Other Ambulatory Visit: Payer: Self-pay

## 2021-06-06 ENCOUNTER — Encounter: Payer: Self-pay | Admitting: Podiatry

## 2021-06-06 DIAGNOSIS — M79675 Pain in left toe(s): Secondary | ICD-10-CM | POA: Diagnosis not present

## 2021-06-06 DIAGNOSIS — M2550 Pain in unspecified joint: Secondary | ICD-10-CM | POA: Insufficient documentation

## 2021-06-06 DIAGNOSIS — M79674 Pain in right toe(s): Secondary | ICD-10-CM | POA: Diagnosis not present

## 2021-06-06 DIAGNOSIS — B351 Tinea unguium: Secondary | ICD-10-CM | POA: Diagnosis not present

## 2021-06-06 DIAGNOSIS — IMO0001 Reserved for inherently not codable concepts without codable children: Secondary | ICD-10-CM | POA: Insufficient documentation

## 2021-06-06 DIAGNOSIS — R443 Hallucinations, unspecified: Secondary | ICD-10-CM | POA: Insufficient documentation

## 2021-06-06 DIAGNOSIS — L84 Corns and callosities: Secondary | ICD-10-CM

## 2021-06-06 DIAGNOSIS — R5383 Other fatigue: Secondary | ICD-10-CM | POA: Insufficient documentation

## 2021-06-06 DIAGNOSIS — R32 Unspecified urinary incontinence: Secondary | ICD-10-CM | POA: Insufficient documentation

## 2021-06-06 DIAGNOSIS — E7800 Pure hypercholesterolemia, unspecified: Secondary | ICD-10-CM | POA: Insufficient documentation

## 2021-06-06 DIAGNOSIS — F17210 Nicotine dependence, cigarettes, uncomplicated: Secondary | ICD-10-CM | POA: Insufficient documentation

## 2021-06-06 DIAGNOSIS — Z720 Tobacco use: Secondary | ICD-10-CM | POA: Insufficient documentation

## 2021-06-06 DIAGNOSIS — M1990 Unspecified osteoarthritis, unspecified site: Secondary | ICD-10-CM | POA: Insufficient documentation

## 2021-06-06 DIAGNOSIS — F419 Anxiety disorder, unspecified: Secondary | ICD-10-CM | POA: Insufficient documentation

## 2021-06-06 DIAGNOSIS — F322 Major depressive disorder, single episode, severe without psychotic features: Secondary | ICD-10-CM | POA: Insufficient documentation

## 2021-06-06 DIAGNOSIS — R1310 Dysphagia, unspecified: Secondary | ICD-10-CM | POA: Insufficient documentation

## 2021-06-06 DIAGNOSIS — R5381 Other malaise: Secondary | ICD-10-CM | POA: Insufficient documentation

## 2021-06-06 NOTE — Progress Notes (Signed)
This patient returns to the office for evaluation and treatment of long thick painful nails .  This patient is unable to trim her own nails since the patient cannot reach her feet.  Patient says the nails are painful walking and wearing his shoes.She has severely thick callus on both forefeet.  Patient has amputation second toe right foot with severe overlapping hammer toe second toe left foot.  Patient presents to the office in 6-7 months.  She is brought to the office by her son.  She returns for preventive foot care services.  General Appearance  Alert, conversant and in no acute stress.  Vascular  Dorsalis pedis  are palpable  bilaterally. Posterior tibial pulses are weakly palpable  B/L.   Capillary return is within normal limits  bilaterally. Cold feet bilaterally.  Absent digital hair  B/L.  Neurologic  Senn-Weinstein monofilament wire test within normal limits 1-5 left and 1,3-5 right.. Muscle power within normal limits bilaterally.  Nails Thick disfigured discolored nails with subungual debris  from hallux to fifth toes bilaterally. No evidence of bacterial infection or drainage bilaterally.  Orthopedic  No limitations of motion  feet .  No crepitus or effusions noted.  HAV  B/L.  Overlapping hammer toe second left foot.  Amputation second toe right foot.  Skin  normotropic skin with no porokeratosis noted bilaterally.  No signs of infections or ulcers noted.  Severe callus sub 2  B/L.   Onychomycosis  Pain in toes right foot  Pain in toes left foot  Debridement  of nails  1-5  B/L with a nail nipper.  Nails were then filed using a dremel tool with no incidents.  Debride callus with # 15 blade followed by dremel tool usage.  RTC 4 months   Gardiner Barefoot DPM

## 2021-06-28 DIAGNOSIS — F331 Major depressive disorder, recurrent, moderate: Secondary | ICD-10-CM | POA: Diagnosis not present

## 2021-06-28 DIAGNOSIS — R419 Unspecified symptoms and signs involving cognitive functions and awareness: Secondary | ICD-10-CM | POA: Diagnosis not present

## 2021-06-28 DIAGNOSIS — F5105 Insomnia due to other mental disorder: Secondary | ICD-10-CM | POA: Diagnosis not present

## 2021-07-26 DIAGNOSIS — R419 Unspecified symptoms and signs involving cognitive functions and awareness: Secondary | ICD-10-CM | POA: Diagnosis not present

## 2021-07-26 DIAGNOSIS — F331 Major depressive disorder, recurrent, moderate: Secondary | ICD-10-CM | POA: Diagnosis not present

## 2021-07-26 DIAGNOSIS — F5105 Insomnia due to other mental disorder: Secondary | ICD-10-CM | POA: Diagnosis not present

## 2021-08-23 DIAGNOSIS — R419 Unspecified symptoms and signs involving cognitive functions and awareness: Secondary | ICD-10-CM | POA: Diagnosis not present

## 2021-08-23 DIAGNOSIS — F5105 Insomnia due to other mental disorder: Secondary | ICD-10-CM | POA: Diagnosis not present

## 2021-08-23 DIAGNOSIS — F331 Major depressive disorder, recurrent, moderate: Secondary | ICD-10-CM | POA: Diagnosis not present

## 2021-09-27 DIAGNOSIS — F5105 Insomnia due to other mental disorder: Secondary | ICD-10-CM | POA: Diagnosis not present

## 2021-09-27 DIAGNOSIS — R419 Unspecified symptoms and signs involving cognitive functions and awareness: Secondary | ICD-10-CM | POA: Diagnosis not present

## 2021-09-27 DIAGNOSIS — F331 Major depressive disorder, recurrent, moderate: Secondary | ICD-10-CM | POA: Diagnosis not present

## 2021-10-03 ENCOUNTER — Encounter (HOSPITAL_COMMUNITY): Payer: Self-pay | Admitting: Oncology

## 2021-10-03 ENCOUNTER — Other Ambulatory Visit: Payer: Self-pay

## 2021-10-03 ENCOUNTER — Emergency Department (HOSPITAL_COMMUNITY): Payer: PPO

## 2021-10-03 ENCOUNTER — Emergency Department (HOSPITAL_COMMUNITY)
Admission: EM | Admit: 2021-10-03 | Discharge: 2021-10-03 | Disposition: A | Discharge status: Home / Self Care | Payer: PPO | Attending: Emergency Medicine | Admitting: Emergency Medicine

## 2021-10-03 DIAGNOSIS — E039 Hypothyroidism, unspecified: Secondary | ICD-10-CM | POA: Insufficient documentation

## 2021-10-03 DIAGNOSIS — F03918 Unspecified dementia, unspecified severity, with other behavioral disturbance: Secondary | ICD-10-CM | POA: Insufficient documentation

## 2021-10-03 DIAGNOSIS — T148XXA Other injury of unspecified body region, initial encounter: Secondary | ICD-10-CM | POA: Diagnosis not present

## 2021-10-03 DIAGNOSIS — M25551 Pain in right hip: Secondary | ICD-10-CM | POA: Insufficient documentation

## 2021-10-03 DIAGNOSIS — Z043 Encounter for examination and observation following other accident: Secondary | ICD-10-CM | POA: Diagnosis not present

## 2021-10-03 DIAGNOSIS — Z7982 Long term (current) use of aspirin: Secondary | ICD-10-CM | POA: Insufficient documentation

## 2021-10-03 DIAGNOSIS — F17210 Nicotine dependence, cigarettes, uncomplicated: Secondary | ICD-10-CM | POA: Insufficient documentation

## 2021-10-03 DIAGNOSIS — J449 Chronic obstructive pulmonary disease, unspecified: Secondary | ICD-10-CM | POA: Diagnosis not present

## 2021-10-03 DIAGNOSIS — G319 Degenerative disease of nervous system, unspecified: Secondary | ICD-10-CM | POA: Diagnosis not present

## 2021-10-03 DIAGNOSIS — I10 Essential (primary) hypertension: Secondary | ICD-10-CM | POA: Insufficient documentation

## 2021-10-03 DIAGNOSIS — M1811 Unilateral primary osteoarthritis of first carpometacarpal joint, right hand: Secondary | ICD-10-CM | POA: Diagnosis not present

## 2021-10-03 DIAGNOSIS — M25571 Pain in right ankle and joints of right foot: Secondary | ICD-10-CM | POA: Diagnosis not present

## 2021-10-03 DIAGNOSIS — M25511 Pain in right shoulder: Secondary | ICD-10-CM | POA: Insufficient documentation

## 2021-10-03 DIAGNOSIS — S0990XA Unspecified injury of head, initial encounter: Secondary | ICD-10-CM | POA: Diagnosis not present

## 2021-10-03 DIAGNOSIS — R4182 Altered mental status, unspecified: Secondary | ICD-10-CM | POA: Diagnosis not present

## 2021-10-03 DIAGNOSIS — M19011 Primary osteoarthritis, right shoulder: Secondary | ICD-10-CM | POA: Diagnosis not present

## 2021-10-03 DIAGNOSIS — Z85828 Personal history of other malignant neoplasm of skin: Secondary | ICD-10-CM | POA: Diagnosis not present

## 2021-10-03 DIAGNOSIS — W010XXA Fall on same level from slipping, tripping and stumbling without subsequent striking against object, initial encounter: Secondary | ICD-10-CM | POA: Diagnosis not present

## 2021-10-03 DIAGNOSIS — Z7401 Bed confinement status: Secondary | ICD-10-CM | POA: Diagnosis not present

## 2021-10-03 DIAGNOSIS — W19XXXA Unspecified fall, initial encounter: Secondary | ICD-10-CM

## 2021-10-03 DIAGNOSIS — Y9301 Activity, walking, marching and hiking: Secondary | ICD-10-CM | POA: Insufficient documentation

## 2021-10-03 DIAGNOSIS — R5383 Other fatigue: Secondary | ICD-10-CM | POA: Diagnosis not present

## 2021-10-03 DIAGNOSIS — G9389 Other specified disorders of brain: Secondary | ICD-10-CM | POA: Diagnosis not present

## 2021-10-03 DIAGNOSIS — Z79899 Other long term (current) drug therapy: Secondary | ICD-10-CM | POA: Insufficient documentation

## 2021-10-03 DIAGNOSIS — M7989 Other specified soft tissue disorders: Secondary | ICD-10-CM | POA: Diagnosis not present

## 2021-10-03 LAB — CBC WITH DIFFERENTIAL/PLATELET
Abs Immature Granulocytes: 0.04 10*3/uL (ref 0.00–0.07)
Basophils Absolute: 0 10*3/uL (ref 0.0–0.1)
Basophils Relative: 0 %
Eosinophils Absolute: 0.1 10*3/uL (ref 0.0–0.5)
Eosinophils Relative: 1 %
HCT: 40.4 % (ref 36.0–46.0)
Hemoglobin: 13.7 g/dL (ref 12.0–15.0)
Immature Granulocytes: 0 %
Lymphocytes Relative: 14 %
Lymphs Abs: 1.4 10*3/uL (ref 0.7–4.0)
MCH: 31.4 pg (ref 26.0–34.0)
MCHC: 33.9 g/dL (ref 30.0–36.0)
MCV: 92.4 fL (ref 80.0–100.0)
Monocytes Absolute: 0.7 10*3/uL (ref 0.1–1.0)
Monocytes Relative: 7 %
Neutro Abs: 7.6 10*3/uL (ref 1.7–7.7)
Neutrophils Relative %: 78 %
Platelets: 153 10*3/uL (ref 150–400)
RBC: 4.37 MIL/uL (ref 3.87–5.11)
RDW: 13.4 % (ref 11.5–15.5)
WBC: 9.9 10*3/uL (ref 4.0–10.5)
nRBC: 0 % (ref 0.0–0.2)

## 2021-10-03 LAB — URINALYSIS, ROUTINE W REFLEX MICROSCOPIC
Bilirubin Urine: NEGATIVE
Glucose, UA: NEGATIVE mg/dL
Hgb urine dipstick: NEGATIVE
Ketones, ur: NEGATIVE mg/dL
Leukocytes,Ua: NEGATIVE
Nitrite: NEGATIVE
Protein, ur: NEGATIVE mg/dL
Specific Gravity, Urine: 1.011 (ref 1.005–1.030)
pH: 8 (ref 5.0–8.0)

## 2021-10-03 LAB — COMPREHENSIVE METABOLIC PANEL
ALT: 12 U/L (ref 0–44)
AST: 14 U/L — ABNORMAL LOW (ref 15–41)
Albumin: 4 g/dL (ref 3.5–5.0)
Alkaline Phosphatase: 60 U/L (ref 38–126)
Anion gap: 10 (ref 5–15)
BUN: 17 mg/dL (ref 8–23)
CO2: 26 mmol/L (ref 22–32)
Calcium: 9.3 mg/dL (ref 8.9–10.3)
Chloride: 105 mmol/L (ref 98–111)
Creatinine, Ser: 0.53 mg/dL (ref 0.44–1.00)
GFR, Estimated: >60 mL/min (ref >60)
Glucose, Bld: 105 mg/dL — ABNORMAL HIGH (ref 70–99)
Potassium: 3.7 mmol/L (ref 3.5–5.1)
Sodium: 141 mmol/L (ref 135–145)
Total Bilirubin: 0.6 mg/dL (ref 0.3–1.2)
Total Protein: 6.8 g/dL (ref 6.5–8.1)

## 2021-10-03 MED ORDER — ACETAMINOPHEN 325 MG PO TABS
650.0000 mg | ORAL_TABLET | Freq: Once | ORAL | Status: AC
  Administered 2021-10-03: 650 mg via ORAL
  Filled 2021-10-03: qty 2

## 2021-10-03 NOTE — ED Notes (Addendum)
Attempted to call [facility] x2 to give report. Both occurrences, facility answered and then hung up without speaking

## 2021-10-03 NOTE — ED Notes (Signed)
Called [facility] for transport back to [facility]

## 2021-10-03 NOTE — ED Notes (Signed)
Pt noted to have 3 soiled brief on upon performing in and out cath. Pt noted to have copious amounts of loose, brown stool in brief. As well as urine. Pericare performed by this RN and [employer], NT. New brief and linen applied

## 2021-10-03 NOTE — ED Provider Notes (Signed)
[facility] DEPT Provider Note   CSN: [id number] Arrival date & time: 10/03/21  1440     History Chief Complaint  Patient presents with   Kendra Hernandez is a 80 y.o. female.   Fall Pertinent negatives include no chest pain, no abdominal pain, no headaches and no shortness of breath. Patient presents after a fall.  She reports that she was walking with a walker and lost her balance tripping and falling onto her right side.  She denies LOC.  Per chart review, she takes a baby aspirin but is not on any anticoagulation.  She has had pain in her right ankle, right hip, right shoulder.  Currently, she endorses pain in her right shoulder and right ankle.  She denies any chest pain, shortness of breath, abdominal pain, or nausea.  Currently, patient resides in a skilled nursing facility.     Past Medical History:  Diagnosis Date   Anxiety    Arthritis    COPD (chronic obstructive pulmonary disease) ([place])    Hyperlipidemia    Hypertension    Hypothyroid    Insomnia    Reflux    Repeated falls    Skin cancer    Stress    Stroke (cerebrum) [facility])     Patient Active Problem List   Diagnosis Date Noted   Anxiety 06/06/2021   Arthritis 06/06/2021   Dysphagia 06/06/2021   Hallucinations 06/06/2021   Malaise and fatigue 06/06/2021   Multiple joint pain 06/06/2021   Nicotine dependence, cigarettes, uncomplicated 09/32/3557   Pure hypercholesterolemia 06/06/2021   Reflux 06/06/2021   Severe major depression, single episode, without psychotic features ([place]) 06/06/2021   Tobacco user 06/06/2021   Urinary incontinence 06/06/2021   Multiple closed stable fractures of pubic ramus ([place]) 01/18/2020   Hypoxia 01/18/2020   Closed right scapular fracture 01/18/2020   COPD (chronic obstructive pulmonary disease) ([place]) 01/18/2020   Dementia with behavioral disturbance ([place]) 11/11/2017   Chronic ischemic right MCA stroke 11/11/2017   Alcoholism, chronic  ([place]) 06/27/2017   Isopropyl alcohol poisoning    Major depressive disorder, recurrent episode ([place]) 12/21/2016   Major depressive disorder, recurrent episode, severe ([place]) 11/24/2016   Scalp hematoma 07/26/2016   Alcohol withdrawal ([place]) 07/17/2016   Withdrawal symptoms, alcohol ([place]) 07/17/2016   Hypothyroidism 07/17/2016   History of CVA (cerebrovascular accident) 07/17/2016   Head contusion 07/17/2016   Fall 07/17/2016   Insomnia 07/17/2016   Essential hypertension 07/17/2016   Abnormality of gait 01/24/2016   Memory loss 01/24/2016   Acute right MCA stroke ([place]) 01/09/2016   Essential hypertension, benign 01/09/2016   Hyperlipidemia LDL goal <100 01/09/2016   Other specified hypothyroidism 01/09/2016   Cervicalgia 01/09/2016   Esophageal reflux 01/09/2016   Major depression, chronic 01/09/2016   Repeated falls 01/01/2016   Environmental allergies 01/11/2014   Cervical radiculitis 09/08/2013   Osteoarthritis of carpometacarpal joint of thumb 09/08/2013   Pain in joint, forearm 08/25/2013    Past Surgical History:  Procedure Laterality Date   COLONOSCOPY     With polyp removal    MANDIBLE FRACTURE SURGERY     Placement of partial jaw due to abcess   SKIN CANCER EXCISION     Removal of several skin cancers   TOE SURGERY Right    Removal of toe      OB History   No obstetric history on file.     Family History  Problem Relation Age of Onset   Emphysema  Mother    Rheumatologic disease Mother    Heart attack Father    Emphysema Brother    Pancreatic cancer Daughter     Social History   Tobacco Use   Smoking status: Every Day    Packs/day: 2.00    Years: 30.00    Pack years: 60.00    Types: Cigarettes   Smokeless tobacco: Never  Substance Use Topics   Alcohol use: Not Currently    Alcohol/week: 0.0 standard drinks    Comment: last drink 2 wks ago 11-05-16, but is uncontrollable.   Drug use: No    Home Medications Prior to Admission medications    Medication Sig Start Date End Date Taking? Authorizing Provider  acetaminophen (TYLENOL) 500 MG tablet Take 1,000 mg by mouth two times a day    [provider]  amLODipine (NORVASC) 10 MG tablet Take 10 mg by mouth daily.    [provider]  aspirin 81 MG chewable tablet 1 tablet 09/14/20   [provider]  atorvastatin (LIPITOR) 20 MG tablet Take 20 mg by mouth daily.    [provider]  bisacodyl (DULCOLAX) 10 MG suppository If not relieved by MOM, give 10 mg Bisacodyl suppositiory rectally X 1 dose in 24 hours as needed (Do not use constipation standing orders for residents with renal failure/CFR less than 30. Contact MD for orders) (Physician Order)    [provider]  clindamycin (CLEOCIN) 300 MG capsule Take 300 mg by mouth 3 (three) times daily. 02/05/21   [provider]  divalproex (DEPAKOTE) 125 MG DR tablet Take 125 mg by mouth 2 (two) times daily. 05/16/21   [provider]  divalproex (DEPAKOTE) 125 MG DR tablet 1 tablet    [provider]  donepezil (ARICEPT) 10 MG tablet Take 1 tablet (10 mg total) daily by mouth. After meal 11/11/17   Marcial Pacas, MD  ibuprofen (ADVIL) 200 MG tablet 4 tablets    [provider]  ibuprofen (ADVIL) 800 MG tablet Take 800 mg by mouth 3 (three) times daily. 02/05/21   [provider]  labetalol (NORMODYNE) 100 MG tablet Take 100 mg by mouth 2 (two) times daily.     [provider]  levothyroxine (SYNTHROID) 50 MCG tablet Take 1 tablet by mouth daily. 11/16/15   [provider]  loperamide (IMODIUM) 2 MG capsule Take 2 mg by mouth daily as needed (for diarrhea).     [provider]  loratadine (CLARITIN) 10 MG tablet Take 10 mg by mouth daily.    [provider]  magnesium hydroxide (MILK OF MAGNESIA) 400 MG/5ML suspension If no BM in 3 days, give 30 cc Milk of Magnesium p.o. x 1 dose in 24 hours as needed (Do not use standing  constipation orders for residents with renal failure CFR less than 30. Contact MD for orders) (Physician Order)    [provider]  nicotine (NICODERM CQ - DOSED IN MG/24 HOURS) 21 mg/24hr patch Place 1 patch (21 mg total) onto the skin daily. 01/24/20   Mikhail, Velta Addison, DO  omeprazole (PRILOSEC) 40 MG capsule Take 40 mg by mouth daily before breakfast.     [provider]  QUEtiapine (SEROQUEL) 25 MG tablet Take 25 mg by mouth every evening.     [provider]  [facility] injection  03/28/21   [provider]  sodium chloride (OCEAN) 0.65 % SOLN nasal spray Place 2 sprays into both nostrils daily as needed (for dryness).  [provider]  Sodium Phosphates (RA SALINE ENEMA RE) If not relieved by Biscodyl suppository, give disposable Saline Enema rectally X 1 dose/24 hrs as needed (Do not use constipation standing orders for residents with renal failure/CFR less than 30. Contact MD for orders)(Physician Or    [provider]  solifenacin (VESICARE) 5 MG tablet Take 5 mg by mouth daily. 05/11/21   [provider]  traZODone (DESYREL) 100 MG tablet Take 200 mg by mouth at bedtime.     [provider]  trolamine salicylate (ASPERCREME) 10 % cream Apply 1 application topically See admin instructions. Apply to both hands at bedtime    [provider]  Trospium Chloride 60 MG CP24 Take 60 mg by mouth daily.     [provider]  Venlafaxine HCl 75 MG TB24 Take 225 mg by mouth every morning.    [provider]    Allergies    Penicillins, Ceftin [cefuroxime axetil], and Simvastatin  Review of Systems   Review of Systems  Constitutional:  Negative for chills, fatigue and fever.  HENT:  Negative for congestion, ear pain and sore throat.   Eyes:  Negative for pain and visual disturbance.  Respiratory:  Negative for cough, chest tightness and shortness of breath.   Cardiovascular:  Negative for chest pain  and palpitations.  Gastrointestinal:  Negative for abdominal pain, nausea and vomiting.  Genitourinary:  Negative for dysuria, flank pain, hematuria and pelvic pain.  Musculoskeletal:  Positive for arthralgias. Negative for back pain, neck pain and neck stiffness.  Skin:  Negative for color change, rash and wound.  Neurological:  Negative for dizziness, seizures, syncope, facial asymmetry, weakness, light-headedness, numbness and headaches.  Hematological:  Does not bruise/bleed easily.  All other systems reviewed and are negative.  Physical Exam Updated Vital Signs BP (!) 130/104 (BP Location: Left Arm)   Pulse 80   Temp 98.3 F (36.8 C) (Oral)   Resp 18   SpO2 90%   Physical Exam Vitals and nursing note reviewed.  Constitutional:      General: She is not in acute distress.    Appearance: Normal appearance. She is well-developed. She is ill-appearing (Chronically). She is not toxic-appearing or diaphoretic.  HENT:     Head: Normocephalic and atraumatic.     Right Ear: External ear normal.     Left Ear: External ear normal.     Nose: Nose normal.     Mouth/Throat:     Mouth: Mucous membranes are moist.     Pharynx: Oropharynx is clear.  Eyes:     Conjunctiva/sclera: Conjunctivae normal.     Pupils: Pupils are equal, round, and reactive to light.  Cardiovascular:     Rate and Rhythm: Normal rate and regular rhythm.     Heart sounds: No murmur heard. Pulmonary:     Effort: Pulmonary effort is normal. No respiratory distress.     Breath sounds: Normal breath sounds. No wheezing or rales.  Chest:     Chest wall: No tenderness.  Abdominal:     Palpations: Abdomen is soft.     Tenderness: There is no abdominal tenderness. There is no right CVA tenderness or left CVA tenderness.  Musculoskeletal:        General: Swelling, tenderness and signs of injury present.     Cervical back: Normal range of motion and neck supple. No rigidity.  Skin:    General: Skin is warm and dry.      Coloration: Skin is  not jaundiced or pale.  Neurological:     General: No focal deficit present.     Mental Status: She is alert.     Cranial Nerves: No cranial nerve deficit.     Sensory: No sensory deficit.     Motor: No weakness.  Psychiatric:        Mood and Affect: Mood normal.        Behavior: Behavior normal.    ED Results / Procedures / Treatments   Labs (all labs ordered are listed, but only abnormal results are displayed) Labs Reviewed  COMPREHENSIVE METABOLIC PANEL - Abnormal; Notable for the following components:      Result Value   Glucose, Bld 105 (*)    AST 14 (*)    All other components within normal limits  URINALYSIS, ROUTINE W REFLEX MICROSCOPIC - Abnormal; Notable for the following components:   APPearance HAZY (*)    All other components within normal limits  CBC WITH DIFFERENTIAL/PLATELET    EKG None  Radiology DG Shoulder Right  Result Date: 10/03/2021 CLINICAL DATA:  Status post fall EXAM: RIGHT SHOULDER - 2+ VIEW; RIGHT HAND - COMPLETE 3+ VIEW COMPARISON:  None. FINDINGS: Right shoulder: Diffusely decreased bone density. No evidence of fracture or dislocation. At least moderate degenerative changes of the glenohumeral joint. No aggressive appearing focal bone abnormality. Soft tissues are unremarkable. Right hand: Diffusely decreased bone density. No evidence of fracture or dislocation. Severe degenerative changes of the scaphoid trapezium joint as well as scaphoid trapezoid joint. At least moderate degenerative changes of the trapezoid capitate joint. At least mild to moderate degenerative changes of the distal interphalangeal joints. At least mild degenerative changes of the first carpometacarpal joint. No aggressive appearing focal bone abnormality. Subcutaneus soft tissue edema with question soft tissue defect along the volar aspect of the wrist. Aspect of the wrist. IMPRESSION: 1. No acute displaced fracture or dislocation of the bones of the right  shoulder and hand. 2. Diffusely decreased bone density. 3. Degenerative changes. Electronically Signed   By: Iven Finn M.D.   On: 10/03/2021 16:01   DG Ankle Complete Right  Result Date: 10/03/2021 CLINICAL DATA:  Fall EXAM: RIGHT ANKLE - COMPLETE 3+ VIEW COMPARISON:  None. FINDINGS: The bones are markedly demineralized. There is no evidence of acute fracture or dislocation. Ankle alignment is maintained. The ankle mortise appears intact. There is soft tissue swelling over the lateral malleolus. Prior pin fixation of the great toe metatarsal is noted. IMPRESSION: Soft tissue swelling over the lateral malleolus without evidence of acute fracture or dislocation. Electronically Signed   By: Valetta Mole M.D.   On: 10/03/2021 16:00   CT HEAD WO CONTRAST (5MM)  Result Date: 10/03/2021 CLINICAL DATA:  Fall.  Head trauma. EXAM: CT HEAD WITHOUT CONTRAST TECHNIQUE: Contiguous axial images were obtained from the base of the skull through the vertex without intravenous contrast. COMPARISON:  08/08/2018 FINDINGS: Brain: No evidence of acute infarction, hemorrhage, hydrocephalus, extra-axial collection or mass lesion/mass effect. Old right basal ganglia to centrum semiovale and right anterior temporal lobe infarcts. Ventricular sulcal enlargement reflecting mild diffuse atrophy. Mild areas of patchy white matter hypoattenuation consistent with chronic microvascular ischemic change. These findings are stable. Vascular: No hyperdense vessel or unexpected calcification. Skull: Normal. Negative for fracture or focal lesion. Sinuses/Orbits: Globes and orbits are unremarkable. Visualized sinuses are clear. Other: None. IMPRESSION: 1. No acute intracranial abnormalities. Stable appearance from the prior head CT. Electronically Signed   By: Dedra Skeens.D.  On: 10/03/2021 16:01   DG Hand Complete Right  Result Date: 10/03/2021 CLINICAL DATA:  Status post fall EXAM: RIGHT SHOULDER - 2+ VIEW; RIGHT HAND - COMPLETE 3+  VIEW COMPARISON:  None. FINDINGS: Right shoulder: Diffusely decreased bone density. No evidence of fracture or dislocation. At least moderate degenerative changes of the glenohumeral joint. No aggressive appearing focal bone abnormality. Soft tissues are unremarkable. Right hand: Diffusely decreased bone density. No evidence of fracture or dislocation. Severe degenerative changes of the scaphoid trapezium joint as well as scaphoid trapezoid joint. At least moderate degenerative changes of the trapezoid capitate joint. At least mild to moderate degenerative changes of the distal interphalangeal joints. At least mild degenerative changes of the first carpometacarpal joint. No aggressive appearing focal bone abnormality. Subcutaneus soft tissue edema with question soft tissue defect along the volar aspect of the wrist. Aspect of the wrist. IMPRESSION: 1. No acute displaced fracture or dislocation of the bones of the right shoulder and hand. 2. Diffusely decreased bone density. 3. Degenerative changes. Electronically Signed   By: Iven Finn M.D.   On: 10/03/2021 16:01   DG Hip Unilat W or Wo Pelvis 2-3 Views Right  Result Date: 10/03/2021 CLINICAL DATA:  Fall.  Right hip pain. EXAM: DG HIP (WITH OR WITHOUT PELVIS) 2-3V RIGHT COMPARISON:  01/18/2020 FINDINGS: No fracture or bone lesion. Hip joints, SI joints and symphysis pubis are normally aligned. Skeletal structures are demineralized. No acute soft tissue abnormality. IMPRESSION: No fracture or dislocation. Electronically Signed   By: Lajean Manes M.D.   On: 10/03/2021 15:59    Procedures Procedures   Medications Ordered in ED Medications  acetaminophen (TYLENOL) tablet 650 mg (650 mg Oral Given 10/03/21 1859)    ED Course  I have reviewed the triage vital signs and the nursing notes.  Pertinent labs & imaging results that were available during my care of the patient were reviewed by me and considered in my medical decision making (see chart for  details).    MDM Rules/Calculators/A&P                          Patient is a 80 year old female presenting from a skilled nursing facility following a mechanical fall.  Prior to being bedded in the ED, lab work and imaging were obtained.  CT of head showed NAICA.  X-ray imaging of right shoulder, hand, ankle, and hip were negative for acute fractures or dislocations.  Vital signs are reassuring.  On exam, patient does have swelling to the lateral malleolus of her right ankle.  Ankle was wrapped with Ace bandage.  Patient was informed of reassuring imaging results.  Patient does feel comfortable with discharge back to her facility.  Given the concern of foul-smelling urine, urinalysis was obtained.  No evidence of acute infection.  Patient was discharged in stable condition.  Final Clinical Impression(s) / ED Diagnoses Final diagnoses:  Fall, initial encounter    Rx / DC Orders ED Discharge Orders     None        Godfrey Pick, MD 10/04/21 1214

## 2021-10-03 NOTE — ED Triage Notes (Signed)
Pt bib [facility] from [place] s/p fall.  Pt is c/o pain to right hip, right shoulder.  Pt has strong urine odor.

## 2021-10-03 NOTE — ED Provider Notes (Signed)
Emergency Medicine Provider Triage Evaluation Note  Kendra Hernandez , a 80 y.o. female  was evaluated in triage.  Pt complains of a fall that occurred prior to arrival.  Patient lives in [place] and states that she was walking with her walker, tripped, and fell to her right side.  Reports pain to the right ankle, right hip, and right shoulder.  Nursing staff noted a strong urine odor.  Physical Exam  BP 138/84 (BP Location: Left Arm)   Pulse 77   Temp 98.8 F (37.1 C) (Oral)   Resp 16   SpO2 91%  Gen:   Awake, no distress   Resp:  Normal effort  MSK:   Moves extremities without difficulty  Other:  Mild tenderness noted to the right shoulder, right hip, and right ankle.  No midline spine pain.  Medical Decision Making  Medically screening exam initiated at 2:56 PM.  Appropriate orders placed.  Thia Olesen was informed that the remainder of the evaluation will be completed by another provider, this initial triage assessment does not replace that evaluation, and the importance of remaining in the ED until their evaluation is complete.   Rayna Sexton, PA-C 10/03/21 1457    Lacretia Leigh, MD 10/04/21 1002

## 2021-10-09 DIAGNOSIS — F322 Major depressive disorder, single episode, severe without psychotic features: Secondary | ICD-10-CM | POA: Diagnosis not present

## 2021-10-09 DIAGNOSIS — R32 Unspecified urinary incontinence: Secondary | ICD-10-CM | POA: Diagnosis not present

## 2021-10-09 DIAGNOSIS — I10 Essential (primary) hypertension: Secondary | ICD-10-CM | POA: Diagnosis not present

## 2021-10-09 DIAGNOSIS — S93409A Sprain of unspecified ligament of unspecified ankle, initial encounter: Secondary | ICD-10-CM | POA: Diagnosis not present

## 2021-10-09 DIAGNOSIS — E7800 Pure hypercholesterolemia, unspecified: Secondary | ICD-10-CM | POA: Diagnosis not present

## 2021-10-09 DIAGNOSIS — F0390 Unspecified dementia without behavioral disturbance: Secondary | ICD-10-CM | POA: Diagnosis not present

## 2021-10-09 DIAGNOSIS — I639 Cerebral infarction, unspecified: Secondary | ICD-10-CM | POA: Diagnosis not present

## 2021-10-15 ENCOUNTER — Ambulatory Visit: Payer: PPO | Admitting: Podiatry

## 2021-10-23 DIAGNOSIS — F5105 Insomnia due to other mental disorder: Secondary | ICD-10-CM | POA: Diagnosis not present

## 2021-10-23 DIAGNOSIS — F331 Major depressive disorder, recurrent, moderate: Secondary | ICD-10-CM | POA: Diagnosis not present

## 2021-10-23 DIAGNOSIS — R419 Unspecified symptoms and signs involving cognitive functions and awareness: Secondary | ICD-10-CM | POA: Diagnosis not present

## 2021-11-27 DIAGNOSIS — F5105 Insomnia due to other mental disorder: Secondary | ICD-10-CM | POA: Diagnosis not present

## 2021-11-27 DIAGNOSIS — F331 Major depressive disorder, recurrent, moderate: Secondary | ICD-10-CM | POA: Diagnosis not present

## 2021-11-27 DIAGNOSIS — R419 Unspecified symptoms and signs involving cognitive functions and awareness: Secondary | ICD-10-CM | POA: Diagnosis not present

## 2021-12-03 ENCOUNTER — Ambulatory Visit (INDEPENDENT_AMBULATORY_CARE_PROVIDER_SITE_OTHER): Payer: Self-pay | Admitting: Podiatry

## 2021-12-03 DIAGNOSIS — Z91199 Patient's noncompliance with other medical treatment and regimen due to unspecified reason: Secondary | ICD-10-CM

## 2021-12-03 NOTE — Progress Notes (Signed)
No show

## 2021-12-25 DIAGNOSIS — R419 Unspecified symptoms and signs involving cognitive functions and awareness: Secondary | ICD-10-CM | POA: Diagnosis not present

## 2021-12-25 DIAGNOSIS — F331 Major depressive disorder, recurrent, moderate: Secondary | ICD-10-CM | POA: Diagnosis not present

## 2021-12-25 DIAGNOSIS — F5105 Insomnia due to other mental disorder: Secondary | ICD-10-CM | POA: Diagnosis not present

## 2022-01-11 DIAGNOSIS — E785 Hyperlipidemia, unspecified: Secondary | ICD-10-CM | POA: Diagnosis not present

## 2022-01-11 DIAGNOSIS — R2689 Other abnormalities of gait and mobility: Secondary | ICD-10-CM | POA: Diagnosis not present

## 2022-01-11 DIAGNOSIS — M24562 Contracture, left knee: Secondary | ICD-10-CM | POA: Diagnosis not present

## 2022-01-11 DIAGNOSIS — R1311 Dysphagia, oral phase: Secondary | ICD-10-CM | POA: Diagnosis not present

## 2022-01-11 DIAGNOSIS — F0390 Unspecified dementia without behavioral disturbance: Secondary | ICD-10-CM | POA: Diagnosis not present

## 2022-01-11 DIAGNOSIS — M6281 Muscle weakness (generalized): Secondary | ICD-10-CM | POA: Diagnosis not present

## 2022-01-11 DIAGNOSIS — R1312 Dysphagia, oropharyngeal phase: Secondary | ICD-10-CM | POA: Diagnosis not present

## 2022-01-11 DIAGNOSIS — Z993 Dependence on wheelchair: Secondary | ICD-10-CM | POA: Diagnosis not present

## 2022-01-11 DIAGNOSIS — M24572 Contracture, left ankle: Secondary | ICD-10-CM | POA: Diagnosis not present

## 2022-01-11 DIAGNOSIS — Z23 Encounter for immunization: Secondary | ICD-10-CM | POA: Diagnosis not present

## 2022-01-11 DIAGNOSIS — Z9181 History of falling: Secondary | ICD-10-CM | POA: Diagnosis not present

## 2022-01-11 DIAGNOSIS — M24571 Contracture, right ankle: Secondary | ICD-10-CM | POA: Diagnosis not present

## 2022-01-11 DIAGNOSIS — R531 Weakness: Secondary | ICD-10-CM | POA: Diagnosis not present

## 2022-01-11 DIAGNOSIS — J449 Chronic obstructive pulmonary disease, unspecified: Secondary | ICD-10-CM | POA: Diagnosis not present

## 2022-01-11 DIAGNOSIS — M24542 Contracture, left hand: Secondary | ICD-10-CM | POA: Diagnosis not present

## 2022-01-14 DIAGNOSIS — F0390 Unspecified dementia without behavioral disturbance: Secondary | ICD-10-CM | POA: Diagnosis not present

## 2022-01-14 DIAGNOSIS — R5381 Other malaise: Secondary | ICD-10-CM | POA: Diagnosis not present

## 2022-01-14 DIAGNOSIS — J449 Chronic obstructive pulmonary disease, unspecified: Secondary | ICD-10-CM | POA: Diagnosis not present

## 2022-01-14 DIAGNOSIS — F323 Major depressive disorder, single episode, severe with psychotic features: Secondary | ICD-10-CM | POA: Diagnosis not present

## 2022-01-25 DIAGNOSIS — F323 Major depressive disorder, single episode, severe with psychotic features: Secondary | ICD-10-CM | POA: Diagnosis not present

## 2022-01-25 DIAGNOSIS — J449 Chronic obstructive pulmonary disease, unspecified: Secondary | ICD-10-CM | POA: Diagnosis not present

## 2022-01-25 DIAGNOSIS — R5381 Other malaise: Secondary | ICD-10-CM | POA: Diagnosis not present

## 2022-01-25 DIAGNOSIS — F03918 Unspecified dementia, unspecified severity, with other behavioral disturbance: Secondary | ICD-10-CM | POA: Diagnosis not present

## 2022-01-29 DIAGNOSIS — J449 Chronic obstructive pulmonary disease, unspecified: Secondary | ICD-10-CM | POA: Diagnosis not present

## 2022-01-29 DIAGNOSIS — K219 Gastro-esophageal reflux disease without esophagitis: Secondary | ICD-10-CM | POA: Diagnosis not present

## 2022-01-29 DIAGNOSIS — E785 Hyperlipidemia, unspecified: Secondary | ICD-10-CM | POA: Diagnosis not present

## 2022-01-29 DIAGNOSIS — I1310 Hypertensive heart and chronic kidney disease without heart failure, with stage 1 through stage 4 chronic kidney disease, or unspecified chronic kidney disease: Secondary | ICD-10-CM | POA: Diagnosis not present

## 2022-01-29 DIAGNOSIS — F03918 Unspecified dementia, unspecified severity, with other behavioral disturbance: Secondary | ICD-10-CM | POA: Diagnosis not present

## 2022-01-29 DIAGNOSIS — F329 Major depressive disorder, single episode, unspecified: Secondary | ICD-10-CM | POA: Diagnosis not present

## 2022-01-31 DIAGNOSIS — F5101 Primary insomnia: Secondary | ICD-10-CM | POA: Diagnosis not present

## 2022-01-31 DIAGNOSIS — F331 Major depressive disorder, recurrent, moderate: Secondary | ICD-10-CM | POA: Diagnosis not present

## 2022-01-31 DIAGNOSIS — F01B4 Vascular dementia, moderate, with anxiety: Secondary | ICD-10-CM | POA: Diagnosis not present

## 2022-02-08 DIAGNOSIS — E559 Vitamin D deficiency, unspecified: Secondary | ICD-10-CM | POA: Diagnosis not present

## 2022-02-08 DIAGNOSIS — D519 Vitamin B12 deficiency anemia, unspecified: Secondary | ICD-10-CM | POA: Diagnosis not present

## 2022-02-08 DIAGNOSIS — I10 Essential (primary) hypertension: Secondary | ICD-10-CM | POA: Diagnosis not present

## 2022-02-12 DIAGNOSIS — E861 Hypovolemia: Secondary | ICD-10-CM | POA: Diagnosis not present

## 2022-02-12 DIAGNOSIS — E559 Vitamin D deficiency, unspecified: Secondary | ICD-10-CM | POA: Diagnosis not present

## 2022-02-12 DIAGNOSIS — E46 Unspecified protein-calorie malnutrition: Secondary | ICD-10-CM | POA: Diagnosis not present

## 2022-02-14 DIAGNOSIS — I1310 Hypertensive heart and chronic kidney disease without heart failure, with stage 1 through stage 4 chronic kidney disease, or unspecified chronic kidney disease: Secondary | ICD-10-CM | POA: Diagnosis not present

## 2022-02-14 DIAGNOSIS — I10 Essential (primary) hypertension: Secondary | ICD-10-CM | POA: Diagnosis not present

## 2022-02-14 DIAGNOSIS — F03918 Unspecified dementia, unspecified severity, with other behavioral disturbance: Secondary | ICD-10-CM | POA: Diagnosis not present

## 2022-02-14 DIAGNOSIS — K219 Gastro-esophageal reflux disease without esophagitis: Secondary | ICD-10-CM | POA: Diagnosis not present

## 2022-02-14 DIAGNOSIS — E785 Hyperlipidemia, unspecified: Secondary | ICD-10-CM | POA: Diagnosis not present

## 2022-02-24 DIAGNOSIS — M25531 Pain in right wrist: Secondary | ICD-10-CM | POA: Diagnosis not present

## 2022-02-25 DIAGNOSIS — I10 Essential (primary) hypertension: Secondary | ICD-10-CM | POA: Diagnosis not present

## 2022-02-26 DIAGNOSIS — E861 Hypovolemia: Secondary | ICD-10-CM | POA: Diagnosis not present

## 2022-03-04 DIAGNOSIS — F01B4 Vascular dementia, moderate, with anxiety: Secondary | ICD-10-CM | POA: Diagnosis not present

## 2022-03-04 DIAGNOSIS — F331 Major depressive disorder, recurrent, moderate: Secondary | ICD-10-CM | POA: Diagnosis not present

## 2022-03-04 DIAGNOSIS — F5101 Primary insomnia: Secondary | ICD-10-CM | POA: Diagnosis not present

## 2022-03-07 DIAGNOSIS — M6281 Muscle weakness (generalized): Secondary | ICD-10-CM | POA: Diagnosis not present

## 2022-03-07 DIAGNOSIS — F323 Major depressive disorder, single episode, severe with psychotic features: Secondary | ICD-10-CM | POA: Diagnosis not present

## 2022-03-11 DIAGNOSIS — M6281 Muscle weakness (generalized): Secondary | ICD-10-CM | POA: Diagnosis not present

## 2022-03-11 DIAGNOSIS — G4700 Insomnia, unspecified: Secondary | ICD-10-CM | POA: Diagnosis not present

## 2022-03-11 DIAGNOSIS — E46 Unspecified protein-calorie malnutrition: Secondary | ICD-10-CM | POA: Diagnosis not present

## 2022-03-11 DIAGNOSIS — F323 Major depressive disorder, single episode, severe with psychotic features: Secondary | ICD-10-CM | POA: Diagnosis not present

## 2022-03-14 DIAGNOSIS — I10 Essential (primary) hypertension: Secondary | ICD-10-CM | POA: Diagnosis not present

## 2022-03-18 DIAGNOSIS — I10 Essential (primary) hypertension: Secondary | ICD-10-CM | POA: Diagnosis not present

## 2022-03-18 DIAGNOSIS — F323 Major depressive disorder, single episode, severe with psychotic features: Secondary | ICD-10-CM | POA: Diagnosis not present

## 2022-03-18 DIAGNOSIS — M13819 Other specified arthritis, unspecified shoulder: Secondary | ICD-10-CM | POA: Diagnosis not present

## 2022-03-18 DIAGNOSIS — M6281 Muscle weakness (generalized): Secondary | ICD-10-CM | POA: Diagnosis not present

## 2022-03-26 DIAGNOSIS — F331 Major depressive disorder, recurrent, moderate: Secondary | ICD-10-CM | POA: Diagnosis not present

## 2022-03-26 DIAGNOSIS — F5101 Primary insomnia: Secondary | ICD-10-CM | POA: Diagnosis not present

## 2022-03-26 DIAGNOSIS — F01B4 Vascular dementia, moderate, with anxiety: Secondary | ICD-10-CM | POA: Diagnosis not present

## 2022-03-28 DIAGNOSIS — F01B4 Vascular dementia, moderate, with anxiety: Secondary | ICD-10-CM | POA: Diagnosis not present

## 2022-03-28 DIAGNOSIS — F5101 Primary insomnia: Secondary | ICD-10-CM | POA: Diagnosis not present

## 2022-03-28 DIAGNOSIS — F331 Major depressive disorder, recurrent, moderate: Secondary | ICD-10-CM | POA: Diagnosis not present

## 2022-04-02 DIAGNOSIS — I10 Essential (primary) hypertension: Secondary | ICD-10-CM | POA: Diagnosis not present

## 2022-04-09 DIAGNOSIS — F5101 Primary insomnia: Secondary | ICD-10-CM | POA: Diagnosis not present

## 2022-04-09 DIAGNOSIS — F01B4 Vascular dementia, moderate, with anxiety: Secondary | ICD-10-CM | POA: Diagnosis not present

## 2022-04-09 DIAGNOSIS — F331 Major depressive disorder, recurrent, moderate: Secondary | ICD-10-CM | POA: Diagnosis not present

## 2022-04-10 DIAGNOSIS — I10 Essential (primary) hypertension: Secondary | ICD-10-CM | POA: Diagnosis not present

## 2022-04-15 DIAGNOSIS — F323 Major depressive disorder, single episode, severe with psychotic features: Secondary | ICD-10-CM | POA: Diagnosis not present

## 2022-04-15 DIAGNOSIS — E46 Unspecified protein-calorie malnutrition: Secondary | ICD-10-CM | POA: Diagnosis not present

## 2022-04-18 DIAGNOSIS — I69891 Dysphagia following other cerebrovascular disease: Secondary | ICD-10-CM | POA: Diagnosis not present

## 2022-04-18 DIAGNOSIS — S32501D Unspecified fracture of right pubis, subsequent encounter for fracture with routine healing: Secondary | ICD-10-CM | POA: Diagnosis not present

## 2022-04-18 DIAGNOSIS — E785 Hyperlipidemia, unspecified: Secondary | ICD-10-CM | POA: Diagnosis not present

## 2022-04-18 DIAGNOSIS — M6281 Muscle weakness (generalized): Secondary | ICD-10-CM | POA: Diagnosis not present

## 2022-04-18 DIAGNOSIS — R1312 Dysphagia, oropharyngeal phase: Secondary | ICD-10-CM | POA: Diagnosis not present

## 2022-04-18 DIAGNOSIS — J449 Chronic obstructive pulmonary disease, unspecified: Secondary | ICD-10-CM | POA: Diagnosis not present

## 2022-04-18 DIAGNOSIS — M797 Fibromyalgia: Secondary | ICD-10-CM | POA: Diagnosis not present

## 2022-04-18 DIAGNOSIS — K219 Gastro-esophageal reflux disease without esophagitis: Secondary | ICD-10-CM | POA: Diagnosis not present

## 2022-04-18 DIAGNOSIS — I1310 Hypertensive heart and chronic kidney disease without heart failure, with stage 1 through stage 4 chronic kidney disease, or unspecified chronic kidney disease: Secondary | ICD-10-CM | POA: Diagnosis not present

## 2022-04-18 DIAGNOSIS — Z9181 History of falling: Secondary | ICD-10-CM | POA: Diagnosis not present

## 2022-04-18 DIAGNOSIS — R2681 Unsteadiness on feet: Secondary | ICD-10-CM | POA: Diagnosis not present

## 2022-04-23 DIAGNOSIS — F01B4 Vascular dementia, moderate, with anxiety: Secondary | ICD-10-CM | POA: Diagnosis not present

## 2022-04-23 DIAGNOSIS — F5101 Primary insomnia: Secondary | ICD-10-CM | POA: Diagnosis not present

## 2022-04-23 DIAGNOSIS — F331 Major depressive disorder, recurrent, moderate: Secondary | ICD-10-CM | POA: Diagnosis not present

## 2022-04-24 DIAGNOSIS — R918 Other nonspecific abnormal finding of lung field: Secondary | ICD-10-CM | POA: Diagnosis not present

## 2022-04-24 DIAGNOSIS — I10 Essential (primary) hypertension: Secondary | ICD-10-CM | POA: Diagnosis not present

## 2022-04-24 DIAGNOSIS — J159 Unspecified bacterial pneumonia: Secondary | ICD-10-CM | POA: Diagnosis not present

## 2022-04-24 DIAGNOSIS — R059 Cough, unspecified: Secondary | ICD-10-CM | POA: Diagnosis not present

## 2022-04-24 DIAGNOSIS — J449 Chronic obstructive pulmonary disease, unspecified: Secondary | ICD-10-CM | POA: Diagnosis not present

## 2022-04-25 DIAGNOSIS — N390 Urinary tract infection, site not specified: Secondary | ICD-10-CM | POA: Diagnosis not present

## 2022-04-28 DIAGNOSIS — F331 Major depressive disorder, recurrent, moderate: Secondary | ICD-10-CM | POA: Diagnosis not present

## 2022-04-28 DIAGNOSIS — F5101 Primary insomnia: Secondary | ICD-10-CM | POA: Diagnosis not present

## 2022-04-28 DIAGNOSIS — F01B4 Vascular dementia, moderate, with anxiety: Secondary | ICD-10-CM | POA: Diagnosis not present

## 2022-04-29 DIAGNOSIS — R2681 Unsteadiness on feet: Secondary | ICD-10-CM | POA: Diagnosis not present

## 2022-04-29 DIAGNOSIS — S32501D Unspecified fracture of right pubis, subsequent encounter for fracture with routine healing: Secondary | ICD-10-CM | POA: Diagnosis not present

## 2022-04-29 DIAGNOSIS — R1312 Dysphagia, oropharyngeal phase: Secondary | ICD-10-CM | POA: Diagnosis not present

## 2022-04-29 DIAGNOSIS — I69891 Dysphagia following other cerebrovascular disease: Secondary | ICD-10-CM | POA: Diagnosis not present

## 2022-04-29 DIAGNOSIS — E785 Hyperlipidemia, unspecified: Secondary | ICD-10-CM | POA: Diagnosis not present

## 2022-04-29 DIAGNOSIS — M797 Fibromyalgia: Secondary | ICD-10-CM | POA: Diagnosis not present

## 2022-04-29 DIAGNOSIS — J449 Chronic obstructive pulmonary disease, unspecified: Secondary | ICD-10-CM | POA: Diagnosis not present

## 2022-04-29 DIAGNOSIS — Z9181 History of falling: Secondary | ICD-10-CM | POA: Diagnosis not present

## 2022-04-29 DIAGNOSIS — M6281 Muscle weakness (generalized): Secondary | ICD-10-CM | POA: Diagnosis not present

## 2022-04-30 DIAGNOSIS — J159 Unspecified bacterial pneumonia: Secondary | ICD-10-CM | POA: Diagnosis not present

## 2022-04-30 DIAGNOSIS — N3090 Cystitis, unspecified without hematuria: Secondary | ICD-10-CM | POA: Diagnosis not present

## 2022-05-07 DIAGNOSIS — F331 Major depressive disorder, recurrent, moderate: Secondary | ICD-10-CM | POA: Diagnosis not present

## 2022-05-07 DIAGNOSIS — F01B4 Vascular dementia, moderate, with anxiety: Secondary | ICD-10-CM | POA: Diagnosis not present

## 2022-05-07 DIAGNOSIS — F5101 Primary insomnia: Secondary | ICD-10-CM | POA: Diagnosis not present

## 2022-05-21 DIAGNOSIS — F331 Major depressive disorder, recurrent, moderate: Secondary | ICD-10-CM | POA: Diagnosis not present

## 2022-05-21 DIAGNOSIS — F5101 Primary insomnia: Secondary | ICD-10-CM | POA: Diagnosis not present

## 2022-05-21 DIAGNOSIS — F01B4 Vascular dementia, moderate, with anxiety: Secondary | ICD-10-CM | POA: Diagnosis not present

## 2022-05-23 DIAGNOSIS — F331 Major depressive disorder, recurrent, moderate: Secondary | ICD-10-CM | POA: Diagnosis not present

## 2022-05-23 DIAGNOSIS — F5101 Primary insomnia: Secondary | ICD-10-CM | POA: Diagnosis not present

## 2022-05-23 DIAGNOSIS — F01B4 Vascular dementia, moderate, with anxiety: Secondary | ICD-10-CM | POA: Diagnosis not present

## 2022-06-04 DIAGNOSIS — F01B4 Vascular dementia, moderate, with anxiety: Secondary | ICD-10-CM | POA: Diagnosis not present

## 2022-06-04 DIAGNOSIS — F331 Major depressive disorder, recurrent, moderate: Secondary | ICD-10-CM | POA: Diagnosis not present

## 2022-06-04 DIAGNOSIS — F5101 Primary insomnia: Secondary | ICD-10-CM | POA: Diagnosis not present

## 2022-06-11 DIAGNOSIS — E785 Hyperlipidemia, unspecified: Secondary | ICD-10-CM | POA: Diagnosis not present

## 2022-06-11 DIAGNOSIS — J159 Unspecified bacterial pneumonia: Secondary | ICD-10-CM | POA: Diagnosis not present

## 2022-06-11 DIAGNOSIS — I1310 Hypertensive heart and chronic kidney disease without heart failure, with stage 1 through stage 4 chronic kidney disease, or unspecified chronic kidney disease: Secondary | ICD-10-CM | POA: Diagnosis not present

## 2022-06-11 DIAGNOSIS — K219 Gastro-esophageal reflux disease without esophagitis: Secondary | ICD-10-CM | POA: Diagnosis not present

## 2022-06-20 DIAGNOSIS — F5101 Primary insomnia: Secondary | ICD-10-CM | POA: Diagnosis not present

## 2022-06-20 DIAGNOSIS — F01B4 Vascular dementia, moderate, with anxiety: Secondary | ICD-10-CM | POA: Diagnosis not present

## 2022-06-20 DIAGNOSIS — F331 Major depressive disorder, recurrent, moderate: Secondary | ICD-10-CM | POA: Diagnosis not present

## 2022-06-21 DIAGNOSIS — R059 Cough, unspecified: Secondary | ICD-10-CM | POA: Diagnosis not present

## 2022-06-24 DIAGNOSIS — R059 Cough, unspecified: Secondary | ICD-10-CM | POA: Diagnosis not present

## 2022-06-24 DIAGNOSIS — R4182 Altered mental status, unspecified: Secondary | ICD-10-CM | POA: Diagnosis not present

## 2022-07-05 DIAGNOSIS — J449 Chronic obstructive pulmonary disease, unspecified: Secondary | ICD-10-CM | POA: Diagnosis not present

## 2022-07-05 DIAGNOSIS — R41841 Cognitive communication deficit: Secondary | ICD-10-CM | POA: Diagnosis not present

## 2022-07-05 DIAGNOSIS — E785 Hyperlipidemia, unspecified: Secondary | ICD-10-CM | POA: Diagnosis not present

## 2022-07-05 DIAGNOSIS — I10 Essential (primary) hypertension: Secondary | ICD-10-CM | POA: Diagnosis not present

## 2022-07-05 DIAGNOSIS — M6281 Muscle weakness (generalized): Secondary | ICD-10-CM | POA: Diagnosis not present

## 2022-07-16 DIAGNOSIS — F331 Major depressive disorder, recurrent, moderate: Secondary | ICD-10-CM | POA: Diagnosis not present

## 2022-07-16 DIAGNOSIS — F01B4 Vascular dementia, moderate, with anxiety: Secondary | ICD-10-CM | POA: Diagnosis not present

## 2022-07-16 DIAGNOSIS — F5101 Primary insomnia: Secondary | ICD-10-CM | POA: Diagnosis not present

## 2022-08-13 DIAGNOSIS — K219 Gastro-esophageal reflux disease without esophagitis: Secondary | ICD-10-CM | POA: Diagnosis not present

## 2022-08-13 DIAGNOSIS — I1310 Hypertensive heart and chronic kidney disease without heart failure, with stage 1 through stage 4 chronic kidney disease, or unspecified chronic kidney disease: Secondary | ICD-10-CM | POA: Diagnosis not present

## 2022-08-13 DIAGNOSIS — E785 Hyperlipidemia, unspecified: Secondary | ICD-10-CM | POA: Diagnosis not present

## 2022-08-13 DIAGNOSIS — J449 Chronic obstructive pulmonary disease, unspecified: Secondary | ICD-10-CM | POA: Diagnosis not present

## 2022-08-15 DIAGNOSIS — F331 Major depressive disorder, recurrent, moderate: Secondary | ICD-10-CM | POA: Diagnosis not present

## 2022-08-15 DIAGNOSIS — F01B4 Vascular dementia, moderate, with anxiety: Secondary | ICD-10-CM | POA: Diagnosis not present

## 2022-08-15 DIAGNOSIS — F5101 Primary insomnia: Secondary | ICD-10-CM | POA: Diagnosis not present

## 2022-09-09 DIAGNOSIS — E785 Hyperlipidemia, unspecified: Secondary | ICD-10-CM | POA: Diagnosis not present

## 2022-09-09 DIAGNOSIS — H1031 Unspecified acute conjunctivitis, right eye: Secondary | ICD-10-CM | POA: Diagnosis not present

## 2022-09-09 DIAGNOSIS — M6281 Muscle weakness (generalized): Secondary | ICD-10-CM | POA: Diagnosis not present

## 2022-09-12 DIAGNOSIS — F01B4 Vascular dementia, moderate, with anxiety: Secondary | ICD-10-CM | POA: Diagnosis not present

## 2022-09-12 DIAGNOSIS — F331 Major depressive disorder, recurrent, moderate: Secondary | ICD-10-CM | POA: Diagnosis not present

## 2022-09-12 DIAGNOSIS — F5101 Primary insomnia: Secondary | ICD-10-CM | POA: Diagnosis not present

## 2022-09-18 DIAGNOSIS — R627 Adult failure to thrive: Secondary | ICD-10-CM | POA: Diagnosis not present

## 2022-09-23 DIAGNOSIS — E119 Type 2 diabetes mellitus without complications: Secondary | ICD-10-CM | POA: Diagnosis not present

## 2022-09-23 DIAGNOSIS — I10 Essential (primary) hypertension: Secondary | ICD-10-CM | POA: Diagnosis not present

## 2022-09-23 DIAGNOSIS — E559 Vitamin D deficiency, unspecified: Secondary | ICD-10-CM | POA: Diagnosis not present

## 2022-09-23 DIAGNOSIS — E039 Hypothyroidism, unspecified: Secondary | ICD-10-CM | POA: Diagnosis not present

## 2022-09-25 DIAGNOSIS — E119 Type 2 diabetes mellitus without complications: Secondary | ICD-10-CM | POA: Diagnosis not present

## 2022-09-25 DIAGNOSIS — E039 Hypothyroidism, unspecified: Secondary | ICD-10-CM | POA: Diagnosis not present

## 2022-09-25 DIAGNOSIS — E559 Vitamin D deficiency, unspecified: Secondary | ICD-10-CM | POA: Diagnosis not present

## 2022-09-25 DIAGNOSIS — I10 Essential (primary) hypertension: Secondary | ICD-10-CM | POA: Diagnosis not present

## 2022-10-01 DIAGNOSIS — R627 Adult failure to thrive: Secondary | ICD-10-CM | POA: Diagnosis not present

## 2022-10-01 DIAGNOSIS — E785 Hyperlipidemia, unspecified: Secondary | ICD-10-CM | POA: Diagnosis not present

## 2022-10-01 DIAGNOSIS — M6281 Muscle weakness (generalized): Secondary | ICD-10-CM | POA: Diagnosis not present

## 2022-10-01 DIAGNOSIS — I1310 Hypertensive heart and chronic kidney disease without heart failure, with stage 1 through stage 4 chronic kidney disease, or unspecified chronic kidney disease: Secondary | ICD-10-CM | POA: Diagnosis not present

## 2022-10-03 DIAGNOSIS — E785 Hyperlipidemia, unspecified: Secondary | ICD-10-CM | POA: Diagnosis not present

## 2022-10-03 DIAGNOSIS — M6281 Muscle weakness (generalized): Secondary | ICD-10-CM | POA: Diagnosis not present

## 2022-10-03 DIAGNOSIS — J449 Chronic obstructive pulmonary disease, unspecified: Secondary | ICD-10-CM | POA: Diagnosis not present

## 2022-10-03 DIAGNOSIS — R1312 Dysphagia, oropharyngeal phase: Secondary | ICD-10-CM | POA: Diagnosis not present

## 2022-10-14 DIAGNOSIS — F329 Major depressive disorder, single episode, unspecified: Secondary | ICD-10-CM | POA: Diagnosis not present

## 2022-10-14 DIAGNOSIS — F01511 Vascular dementia, unspecified severity, with agitation: Secondary | ICD-10-CM | POA: Diagnosis not present

## 2022-10-14 DIAGNOSIS — I10 Essential (primary) hypertension: Secondary | ICD-10-CM | POA: Diagnosis not present

## 2022-10-14 DIAGNOSIS — E785 Hyperlipidemia, unspecified: Secondary | ICD-10-CM | POA: Diagnosis not present

## 2022-10-18 DIAGNOSIS — F331 Major depressive disorder, recurrent, moderate: Secondary | ICD-10-CM | POA: Diagnosis not present

## 2022-10-18 DIAGNOSIS — F01B4 Vascular dementia, moderate, with anxiety: Secondary | ICD-10-CM | POA: Diagnosis not present

## 2022-10-18 DIAGNOSIS — F5101 Primary insomnia: Secondary | ICD-10-CM | POA: Diagnosis not present

## 2022-10-30 DIAGNOSIS — R627 Adult failure to thrive: Secondary | ICD-10-CM | POA: Diagnosis not present

## 2022-11-07 DIAGNOSIS — F5101 Primary insomnia: Secondary | ICD-10-CM | POA: Diagnosis not present

## 2022-11-07 DIAGNOSIS — F331 Major depressive disorder, recurrent, moderate: Secondary | ICD-10-CM | POA: Diagnosis not present

## 2022-11-07 DIAGNOSIS — F01B4 Vascular dementia, moderate, with anxiety: Secondary | ICD-10-CM | POA: Diagnosis not present

## 2022-12-05 DIAGNOSIS — F331 Major depressive disorder, recurrent, moderate: Secondary | ICD-10-CM | POA: Diagnosis not present

## 2022-12-05 DIAGNOSIS — F01B4 Vascular dementia, moderate, with anxiety: Secondary | ICD-10-CM | POA: Diagnosis not present

## 2022-12-05 DIAGNOSIS — F5101 Primary insomnia: Secondary | ICD-10-CM | POA: Diagnosis not present

## 2022-12-12 DIAGNOSIS — I1310 Hypertensive heart and chronic kidney disease without heart failure, with stage 1 through stage 4 chronic kidney disease, or unspecified chronic kidney disease: Secondary | ICD-10-CM | POA: Diagnosis not present

## 2022-12-12 DIAGNOSIS — R627 Adult failure to thrive: Secondary | ICD-10-CM | POA: Diagnosis not present

## 2022-12-12 DIAGNOSIS — M6281 Muscle weakness (generalized): Secondary | ICD-10-CM | POA: Diagnosis not present

## 2022-12-12 DIAGNOSIS — E785 Hyperlipidemia, unspecified: Secondary | ICD-10-CM | POA: Diagnosis not present

## 2023-01-02 DIAGNOSIS — F5101 Primary insomnia: Secondary | ICD-10-CM | POA: Diagnosis not present

## 2023-01-02 DIAGNOSIS — F331 Major depressive disorder, recurrent, moderate: Secondary | ICD-10-CM | POA: Diagnosis not present

## 2023-01-02 DIAGNOSIS — F01B4 Vascular dementia, moderate, with anxiety: Secondary | ICD-10-CM | POA: Diagnosis not present

## 2023-01-05 DIAGNOSIS — R1312 Dysphagia, oropharyngeal phase: Secondary | ICD-10-CM | POA: Diagnosis not present

## 2023-01-21 DIAGNOSIS — F331 Major depressive disorder, recurrent, moderate: Secondary | ICD-10-CM | POA: Diagnosis not present

## 2023-01-29 DIAGNOSIS — J449 Chronic obstructive pulmonary disease, unspecified: Secondary | ICD-10-CM | POA: Diagnosis not present

## 2023-01-29 DIAGNOSIS — E785 Hyperlipidemia, unspecified: Secondary | ICD-10-CM | POA: Diagnosis not present

## 2023-01-30 DIAGNOSIS — F5101 Primary insomnia: Secondary | ICD-10-CM | POA: Diagnosis not present

## 2023-01-30 DIAGNOSIS — F331 Major depressive disorder, recurrent, moderate: Secondary | ICD-10-CM | POA: Diagnosis not present

## 2023-01-30 DIAGNOSIS — F01B4 Vascular dementia, moderate, with anxiety: Secondary | ICD-10-CM | POA: Diagnosis not present

## 2023-02-03 DIAGNOSIS — F329 Major depressive disorder, single episode, unspecified: Secondary | ICD-10-CM | POA: Diagnosis not present

## 2023-02-03 DIAGNOSIS — I10 Essential (primary) hypertension: Secondary | ICD-10-CM | POA: Diagnosis not present

## 2023-02-03 DIAGNOSIS — E785 Hyperlipidemia, unspecified: Secondary | ICD-10-CM | POA: Diagnosis not present

## 2023-02-03 DIAGNOSIS — F01511 Vascular dementia, unspecified severity, with agitation: Secondary | ICD-10-CM | POA: Diagnosis not present

## 2023-02-04 DIAGNOSIS — E119 Type 2 diabetes mellitus without complications: Secondary | ICD-10-CM | POA: Diagnosis not present

## 2023-02-04 DIAGNOSIS — Z79899 Other long term (current) drug therapy: Secondary | ICD-10-CM | POA: Diagnosis not present

## 2023-02-04 DIAGNOSIS — E559 Vitamin D deficiency, unspecified: Secondary | ICD-10-CM | POA: Diagnosis not present

## 2023-02-04 DIAGNOSIS — I10 Essential (primary) hypertension: Secondary | ICD-10-CM | POA: Diagnosis not present

## 2023-02-06 DIAGNOSIS — I1310 Hypertensive heart and chronic kidney disease without heart failure, with stage 1 through stage 4 chronic kidney disease, or unspecified chronic kidney disease: Secondary | ICD-10-CM | POA: Diagnosis not present

## 2023-02-06 DIAGNOSIS — E785 Hyperlipidemia, unspecified: Secondary | ICD-10-CM | POA: Diagnosis not present

## 2023-02-06 DIAGNOSIS — R627 Adult failure to thrive: Secondary | ICD-10-CM | POA: Diagnosis not present

## 2023-02-06 DIAGNOSIS — M6281 Muscle weakness (generalized): Secondary | ICD-10-CM | POA: Diagnosis not present

## 2023-02-27 DIAGNOSIS — F01B4 Vascular dementia, moderate, with anxiety: Secondary | ICD-10-CM | POA: Diagnosis not present

## 2023-02-27 DIAGNOSIS — F331 Major depressive disorder, recurrent, moderate: Secondary | ICD-10-CM | POA: Diagnosis not present

## 2023-02-27 DIAGNOSIS — F5101 Primary insomnia: Secondary | ICD-10-CM | POA: Diagnosis not present

## 2023-02-28 DIAGNOSIS — L0291 Cutaneous abscess, unspecified: Secondary | ICD-10-CM | POA: Diagnosis not present

## 2023-03-27 DIAGNOSIS — F5101 Primary insomnia: Secondary | ICD-10-CM | POA: Diagnosis not present

## 2023-03-27 DIAGNOSIS — F01B4 Vascular dementia, moderate, with anxiety: Secondary | ICD-10-CM | POA: Diagnosis not present

## 2023-03-27 DIAGNOSIS — F331 Major depressive disorder, recurrent, moderate: Secondary | ICD-10-CM | POA: Diagnosis not present

## 2023-03-31 DIAGNOSIS — L03211 Cellulitis of face: Secondary | ICD-10-CM | POA: Diagnosis not present

## 2023-04-04 DIAGNOSIS — R1312 Dysphagia, oropharyngeal phase: Secondary | ICD-10-CM | POA: Diagnosis not present

## 2023-04-04 DIAGNOSIS — M6281 Muscle weakness (generalized): Secondary | ICD-10-CM | POA: Diagnosis not present

## 2023-04-04 DIAGNOSIS — E785 Hyperlipidemia, unspecified: Secondary | ICD-10-CM | POA: Diagnosis not present

## 2023-04-04 DIAGNOSIS — M24542 Contracture, left hand: Secondary | ICD-10-CM | POA: Diagnosis not present

## 2023-04-04 DIAGNOSIS — J449 Chronic obstructive pulmonary disease, unspecified: Secondary | ICD-10-CM | POA: Diagnosis not present

## 2023-04-10 DIAGNOSIS — I1310 Hypertensive heart and chronic kidney disease without heart failure, with stage 1 through stage 4 chronic kidney disease, or unspecified chronic kidney disease: Secondary | ICD-10-CM | POA: Diagnosis not present

## 2023-04-10 DIAGNOSIS — M6281 Muscle weakness (generalized): Secondary | ICD-10-CM | POA: Diagnosis not present

## 2023-04-10 DIAGNOSIS — R627 Adult failure to thrive: Secondary | ICD-10-CM | POA: Diagnosis not present

## 2023-04-10 DIAGNOSIS — E785 Hyperlipidemia, unspecified: Secondary | ICD-10-CM | POA: Diagnosis not present

## 2023-04-14 DIAGNOSIS — L03211 Cellulitis of face: Secondary | ICD-10-CM | POA: Diagnosis not present

## 2023-04-21 DIAGNOSIS — L03211 Cellulitis of face: Secondary | ICD-10-CM | POA: Diagnosis not present

## 2023-04-25 DIAGNOSIS — F01B4 Vascular dementia, moderate, with anxiety: Secondary | ICD-10-CM | POA: Diagnosis not present

## 2023-04-25 DIAGNOSIS — F331 Major depressive disorder, recurrent, moderate: Secondary | ICD-10-CM | POA: Diagnosis not present

## 2023-04-25 DIAGNOSIS — F5101 Primary insomnia: Secondary | ICD-10-CM | POA: Diagnosis not present

## 2023-04-28 DIAGNOSIS — L03211 Cellulitis of face: Secondary | ICD-10-CM | POA: Diagnosis not present

## 2023-04-29 DIAGNOSIS — M6281 Muscle weakness (generalized): Secondary | ICD-10-CM | POA: Diagnosis not present

## 2023-04-29 DIAGNOSIS — I1310 Hypertensive heart and chronic kidney disease without heart failure, with stage 1 through stage 4 chronic kidney disease, or unspecified chronic kidney disease: Secondary | ICD-10-CM | POA: Diagnosis not present

## 2023-04-29 DIAGNOSIS — E785 Hyperlipidemia, unspecified: Secondary | ICD-10-CM | POA: Diagnosis not present

## 2023-04-29 DIAGNOSIS — R627 Adult failure to thrive: Secondary | ICD-10-CM | POA: Diagnosis not present

## 2023-05-02 DIAGNOSIS — L98492 Non-pressure chronic ulcer of skin of other sites with fat layer exposed: Secondary | ICD-10-CM | POA: Diagnosis not present

## 2023-05-05 DIAGNOSIS — L03211 Cellulitis of face: Secondary | ICD-10-CM | POA: Diagnosis not present

## 2023-05-12 DIAGNOSIS — L03211 Cellulitis of face: Secondary | ICD-10-CM | POA: Diagnosis not present

## 2023-05-22 DIAGNOSIS — F5101 Primary insomnia: Secondary | ICD-10-CM | POA: Diagnosis not present

## 2023-05-22 DIAGNOSIS — L03211 Cellulitis of face: Secondary | ICD-10-CM | POA: Diagnosis not present

## 2023-05-22 DIAGNOSIS — F331 Major depressive disorder, recurrent, moderate: Secondary | ICD-10-CM | POA: Diagnosis not present

## 2023-05-22 DIAGNOSIS — F01B4 Vascular dementia, moderate, with anxiety: Secondary | ICD-10-CM | POA: Diagnosis not present

## 2023-05-30 DIAGNOSIS — L98492 Non-pressure chronic ulcer of skin of other sites with fat layer exposed: Secondary | ICD-10-CM | POA: Diagnosis not present

## 2023-06-02 DIAGNOSIS — L03211 Cellulitis of face: Secondary | ICD-10-CM | POA: Diagnosis not present

## 2023-06-05 DIAGNOSIS — R627 Adult failure to thrive: Secondary | ICD-10-CM | POA: Diagnosis not present

## 2023-06-05 DIAGNOSIS — I1310 Hypertensive heart and chronic kidney disease without heart failure, with stage 1 through stage 4 chronic kidney disease, or unspecified chronic kidney disease: Secondary | ICD-10-CM | POA: Diagnosis not present

## 2023-06-05 DIAGNOSIS — M6281 Muscle weakness (generalized): Secondary | ICD-10-CM | POA: Diagnosis not present

## 2023-06-05 DIAGNOSIS — E785 Hyperlipidemia, unspecified: Secondary | ICD-10-CM | POA: Diagnosis not present

## 2023-06-09 DIAGNOSIS — L03211 Cellulitis of face: Secondary | ICD-10-CM | POA: Diagnosis not present

## 2023-06-13 DIAGNOSIS — L98492 Non-pressure chronic ulcer of skin of other sites with fat layer exposed: Secondary | ICD-10-CM | POA: Diagnosis not present

## 2023-06-16 DIAGNOSIS — L03211 Cellulitis of face: Secondary | ICD-10-CM | POA: Diagnosis not present

## 2023-06-19 DIAGNOSIS — F331 Major depressive disorder, recurrent, moderate: Secondary | ICD-10-CM | POA: Diagnosis not present

## 2023-06-19 DIAGNOSIS — F01B4 Vascular dementia, moderate, with anxiety: Secondary | ICD-10-CM | POA: Diagnosis not present

## 2023-06-19 DIAGNOSIS — F5101 Primary insomnia: Secondary | ICD-10-CM | POA: Diagnosis not present

## 2023-06-23 DIAGNOSIS — L03211 Cellulitis of face: Secondary | ICD-10-CM | POA: Diagnosis not present

## 2023-06-27 DIAGNOSIS — L98492 Non-pressure chronic ulcer of skin of other sites with fat layer exposed: Secondary | ICD-10-CM | POA: Diagnosis not present

## 2023-06-30 DIAGNOSIS — L03211 Cellulitis of face: Secondary | ICD-10-CM | POA: Diagnosis not present

## 2023-07-01 DIAGNOSIS — M6281 Muscle weakness (generalized): Secondary | ICD-10-CM | POA: Diagnosis not present

## 2023-07-01 DIAGNOSIS — I1310 Hypertensive heart and chronic kidney disease without heart failure, with stage 1 through stage 4 chronic kidney disease, or unspecified chronic kidney disease: Secondary | ICD-10-CM | POA: Diagnosis not present

## 2023-07-01 DIAGNOSIS — R627 Adult failure to thrive: Secondary | ICD-10-CM | POA: Diagnosis not present

## 2023-07-01 DIAGNOSIS — E785 Hyperlipidemia, unspecified: Secondary | ICD-10-CM | POA: Diagnosis not present

## 2023-07-07 DIAGNOSIS — L03211 Cellulitis of face: Secondary | ICD-10-CM | POA: Diagnosis not present

## 2023-07-14 DIAGNOSIS — L03211 Cellulitis of face: Secondary | ICD-10-CM | POA: Diagnosis not present

## 2023-07-17 DIAGNOSIS — F331 Major depressive disorder, recurrent, moderate: Secondary | ICD-10-CM | POA: Diagnosis not present

## 2023-07-17 DIAGNOSIS — F01B4 Vascular dementia, moderate, with anxiety: Secondary | ICD-10-CM | POA: Diagnosis not present

## 2023-07-17 DIAGNOSIS — F5101 Primary insomnia: Secondary | ICD-10-CM | POA: Diagnosis not present

## 2023-07-21 DIAGNOSIS — L03211 Cellulitis of face: Secondary | ICD-10-CM | POA: Diagnosis not present

## 2023-07-25 DIAGNOSIS — L98492 Non-pressure chronic ulcer of skin of other sites with fat layer exposed: Secondary | ICD-10-CM | POA: Diagnosis not present

## 2023-07-28 DIAGNOSIS — L03211 Cellulitis of face: Secondary | ICD-10-CM | POA: Diagnosis not present

## 2023-07-29 DIAGNOSIS — R627 Adult failure to thrive: Secondary | ICD-10-CM | POA: Diagnosis not present

## 2023-07-29 DIAGNOSIS — I1310 Hypertensive heart and chronic kidney disease without heart failure, with stage 1 through stage 4 chronic kidney disease, or unspecified chronic kidney disease: Secondary | ICD-10-CM | POA: Diagnosis not present

## 2023-07-29 DIAGNOSIS — R634 Abnormal weight loss: Secondary | ICD-10-CM | POA: Diagnosis not present

## 2023-08-01 DIAGNOSIS — I10 Essential (primary) hypertension: Secondary | ICD-10-CM | POA: Diagnosis not present

## 2023-08-01 DIAGNOSIS — E119 Type 2 diabetes mellitus without complications: Secondary | ICD-10-CM | POA: Diagnosis not present

## 2023-08-04 DIAGNOSIS — L03211 Cellulitis of face: Secondary | ICD-10-CM | POA: Diagnosis not present

## 2023-08-05 DIAGNOSIS — I10 Essential (primary) hypertension: Secondary | ICD-10-CM | POA: Diagnosis not present

## 2023-08-11 DIAGNOSIS — L03211 Cellulitis of face: Secondary | ICD-10-CM | POA: Diagnosis not present

## 2023-08-13 DIAGNOSIS — E785 Hyperlipidemia, unspecified: Secondary | ICD-10-CM | POA: Diagnosis not present

## 2023-08-13 DIAGNOSIS — F419 Anxiety disorder, unspecified: Secondary | ICD-10-CM | POA: Diagnosis not present

## 2023-08-13 DIAGNOSIS — I1310 Hypertensive heart and chronic kidney disease without heart failure, with stage 1 through stage 4 chronic kidney disease, or unspecified chronic kidney disease: Secondary | ICD-10-CM | POA: Diagnosis not present

## 2023-08-14 DIAGNOSIS — F331 Major depressive disorder, recurrent, moderate: Secondary | ICD-10-CM | POA: Diagnosis not present

## 2023-08-14 DIAGNOSIS — F5101 Primary insomnia: Secondary | ICD-10-CM | POA: Diagnosis not present

## 2023-08-14 DIAGNOSIS — F01B4 Vascular dementia, moderate, with anxiety: Secondary | ICD-10-CM | POA: Diagnosis not present

## 2023-08-18 DIAGNOSIS — L03211 Cellulitis of face: Secondary | ICD-10-CM | POA: Diagnosis not present

## 2023-08-22 DIAGNOSIS — L98492 Non-pressure chronic ulcer of skin of other sites with fat layer exposed: Secondary | ICD-10-CM | POA: Diagnosis not present

## 2023-08-25 DIAGNOSIS — L03211 Cellulitis of face: Secondary | ICD-10-CM | POA: Diagnosis not present

## 2023-08-26 DIAGNOSIS — I1310 Hypertensive heart and chronic kidney disease without heart failure, with stage 1 through stage 4 chronic kidney disease, or unspecified chronic kidney disease: Secondary | ICD-10-CM | POA: Diagnosis not present

## 2023-08-26 DIAGNOSIS — E785 Hyperlipidemia, unspecified: Secondary | ICD-10-CM | POA: Diagnosis not present

## 2023-08-26 DIAGNOSIS — F419 Anxiety disorder, unspecified: Secondary | ICD-10-CM | POA: Diagnosis not present

## 2023-08-26 DIAGNOSIS — R627 Adult failure to thrive: Secondary | ICD-10-CM | POA: Diagnosis not present

## 2023-09-05 DIAGNOSIS — H1032 Unspecified acute conjunctivitis, left eye: Secondary | ICD-10-CM | POA: Diagnosis not present

## 2023-09-08 DIAGNOSIS — L03211 Cellulitis of face: Secondary | ICD-10-CM | POA: Diagnosis not present

## 2023-09-09 DIAGNOSIS — M24562 Contracture, left knee: Secondary | ICD-10-CM | POA: Diagnosis not present

## 2023-09-09 DIAGNOSIS — M24572 Contracture, left ankle: Secondary | ICD-10-CM | POA: Diagnosis not present

## 2023-09-09 DIAGNOSIS — M24571 Contracture, right ankle: Secondary | ICD-10-CM | POA: Diagnosis not present

## 2023-09-11 DIAGNOSIS — F5101 Primary insomnia: Secondary | ICD-10-CM | POA: Diagnosis not present

## 2023-09-11 DIAGNOSIS — F01B4 Vascular dementia, moderate, with anxiety: Secondary | ICD-10-CM | POA: Diagnosis not present

## 2023-09-11 DIAGNOSIS — F331 Major depressive disorder, recurrent, moderate: Secondary | ICD-10-CM | POA: Diagnosis not present

## 2023-09-15 DIAGNOSIS — L03211 Cellulitis of face: Secondary | ICD-10-CM | POA: Diagnosis not present

## 2023-09-19 DIAGNOSIS — L98492 Non-pressure chronic ulcer of skin of other sites with fat layer exposed: Secondary | ICD-10-CM | POA: Diagnosis not present

## 2023-09-23 DIAGNOSIS — L03211 Cellulitis of face: Secondary | ICD-10-CM | POA: Diagnosis not present

## 2023-09-29 DIAGNOSIS — L03211 Cellulitis of face: Secondary | ICD-10-CM | POA: Diagnosis not present

## 2023-10-06 DIAGNOSIS — L03211 Cellulitis of face: Secondary | ICD-10-CM | POA: Diagnosis not present

## 2023-10-09 DIAGNOSIS — F5101 Primary insomnia: Secondary | ICD-10-CM | POA: Diagnosis not present

## 2023-10-09 DIAGNOSIS — F01B4 Vascular dementia, moderate, with anxiety: Secondary | ICD-10-CM | POA: Diagnosis not present

## 2023-10-09 DIAGNOSIS — F331 Major depressive disorder, recurrent, moderate: Secondary | ICD-10-CM | POA: Diagnosis not present

## 2023-10-13 DIAGNOSIS — L03211 Cellulitis of face: Secondary | ICD-10-CM | POA: Diagnosis not present

## 2023-10-14 DIAGNOSIS — L603 Nail dystrophy: Secondary | ICD-10-CM | POA: Diagnosis not present

## 2023-10-14 DIAGNOSIS — I739 Peripheral vascular disease, unspecified: Secondary | ICD-10-CM | POA: Diagnosis not present

## 2023-10-14 DIAGNOSIS — L84 Corns and callosities: Secondary | ICD-10-CM | POA: Diagnosis not present

## 2023-10-14 DIAGNOSIS — L602 Onychogryphosis: Secondary | ICD-10-CM | POA: Diagnosis not present

## 2023-10-17 DIAGNOSIS — L98492 Non-pressure chronic ulcer of skin of other sites with fat layer exposed: Secondary | ICD-10-CM | POA: Diagnosis not present

## 2023-10-20 DIAGNOSIS — L03211 Cellulitis of face: Secondary | ICD-10-CM | POA: Diagnosis not present

## 2023-10-22 DIAGNOSIS — R627 Adult failure to thrive: Secondary | ICD-10-CM | POA: Diagnosis not present

## 2023-10-27 DIAGNOSIS — L03211 Cellulitis of face: Secondary | ICD-10-CM | POA: Diagnosis not present

## 2023-10-27 DIAGNOSIS — E559 Vitamin D deficiency, unspecified: Secondary | ICD-10-CM | POA: Diagnosis not present

## 2023-11-03 DIAGNOSIS — L03211 Cellulitis of face: Secondary | ICD-10-CM | POA: Diagnosis not present

## 2023-11-06 DIAGNOSIS — F331 Major depressive disorder, recurrent, moderate: Secondary | ICD-10-CM | POA: Diagnosis not present

## 2023-11-06 DIAGNOSIS — F5101 Primary insomnia: Secondary | ICD-10-CM | POA: Diagnosis not present

## 2023-11-06 DIAGNOSIS — F01B4 Vascular dementia, moderate, with anxiety: Secondary | ICD-10-CM | POA: Diagnosis not present

## 2023-11-10 DIAGNOSIS — L03211 Cellulitis of face: Secondary | ICD-10-CM | POA: Diagnosis not present

## 2023-11-11 DIAGNOSIS — L98492 Non-pressure chronic ulcer of skin of other sites with fat layer exposed: Secondary | ICD-10-CM | POA: Diagnosis not present

## 2023-11-17 DIAGNOSIS — L03211 Cellulitis of face: Secondary | ICD-10-CM | POA: Diagnosis not present

## 2023-12-01 DIAGNOSIS — L03211 Cellulitis of face: Secondary | ICD-10-CM | POA: Diagnosis not present

## 2023-12-04 DIAGNOSIS — H524 Presbyopia: Secondary | ICD-10-CM | POA: Diagnosis not present

## 2023-12-04 DIAGNOSIS — F01B4 Vascular dementia, moderate, with anxiety: Secondary | ICD-10-CM | POA: Diagnosis not present

## 2023-12-04 DIAGNOSIS — F331 Major depressive disorder, recurrent, moderate: Secondary | ICD-10-CM | POA: Diagnosis not present

## 2023-12-04 DIAGNOSIS — F5101 Primary insomnia: Secondary | ICD-10-CM | POA: Diagnosis not present

## 2023-12-04 DIAGNOSIS — H26493 Other secondary cataract, bilateral: Secondary | ICD-10-CM | POA: Diagnosis not present

## 2023-12-04 DIAGNOSIS — H04123 Dry eye syndrome of bilateral lacrimal glands: Secondary | ICD-10-CM | POA: Diagnosis not present

## 2023-12-08 DIAGNOSIS — L03211 Cellulitis of face: Secondary | ICD-10-CM | POA: Diagnosis not present

## 2023-12-09 DIAGNOSIS — L98492 Non-pressure chronic ulcer of skin of other sites with fat layer exposed: Secondary | ICD-10-CM | POA: Diagnosis not present

## 2023-12-15 DIAGNOSIS — L03211 Cellulitis of face: Secondary | ICD-10-CM | POA: Diagnosis not present

## 2023-12-22 DIAGNOSIS — L03211 Cellulitis of face: Secondary | ICD-10-CM | POA: Diagnosis not present

## 2023-12-29 DIAGNOSIS — L03211 Cellulitis of face: Secondary | ICD-10-CM | POA: Diagnosis not present

## 2023-12-30 DIAGNOSIS — G4700 Insomnia, unspecified: Secondary | ICD-10-CM | POA: Diagnosis not present

## 2023-12-30 DIAGNOSIS — F331 Major depressive disorder, recurrent, moderate: Secondary | ICD-10-CM | POA: Diagnosis not present

## 2023-12-30 DIAGNOSIS — F0390 Unspecified dementia without behavioral disturbance: Secondary | ICD-10-CM | POA: Diagnosis not present

## 2023-12-30 DIAGNOSIS — E785 Hyperlipidemia, unspecified: Secondary | ICD-10-CM | POA: Diagnosis not present

## 2023-12-30 DIAGNOSIS — I10 Essential (primary) hypertension: Secondary | ICD-10-CM | POA: Diagnosis not present

## 2024-01-01 DIAGNOSIS — F01B4 Vascular dementia, moderate, with anxiety: Secondary | ICD-10-CM | POA: Diagnosis not present

## 2024-01-01 DIAGNOSIS — F5101 Primary insomnia: Secondary | ICD-10-CM | POA: Diagnosis not present

## 2024-01-01 DIAGNOSIS — F331 Major depressive disorder, recurrent, moderate: Secondary | ICD-10-CM | POA: Diagnosis not present

## 2024-01-19 DIAGNOSIS — L03211 Cellulitis of face: Secondary | ICD-10-CM | POA: Diagnosis not present

## 2024-01-20 DIAGNOSIS — H109 Unspecified conjunctivitis: Secondary | ICD-10-CM | POA: Diagnosis not present

## 2024-01-23 DIAGNOSIS — H109 Unspecified conjunctivitis: Secondary | ICD-10-CM | POA: Diagnosis not present

## 2024-01-26 DIAGNOSIS — L03211 Cellulitis of face: Secondary | ICD-10-CM | POA: Diagnosis not present

## 2024-01-29 DIAGNOSIS — F01B4 Vascular dementia, moderate, with anxiety: Secondary | ICD-10-CM | POA: Diagnosis not present

## 2024-01-29 DIAGNOSIS — F331 Major depressive disorder, recurrent, moderate: Secondary | ICD-10-CM | POA: Diagnosis not present

## 2024-02-02 DIAGNOSIS — L03211 Cellulitis of face: Secondary | ICD-10-CM | POA: Diagnosis not present

## 2024-02-05 DIAGNOSIS — L98492 Non-pressure chronic ulcer of skin of other sites with fat layer exposed: Secondary | ICD-10-CM | POA: Diagnosis not present

## 2024-02-09 DIAGNOSIS — L03211 Cellulitis of face: Secondary | ICD-10-CM | POA: Diagnosis not present

## 2024-02-16 DIAGNOSIS — L03211 Cellulitis of face: Secondary | ICD-10-CM | POA: Diagnosis not present

## 2024-02-23 DIAGNOSIS — L03211 Cellulitis of face: Secondary | ICD-10-CM | POA: Diagnosis not present

## 2024-02-24 DIAGNOSIS — E785 Hyperlipidemia, unspecified: Secondary | ICD-10-CM | POA: Diagnosis not present

## 2024-02-24 DIAGNOSIS — M272 Inflammatory conditions of jaws: Secondary | ICD-10-CM | POA: Diagnosis not present

## 2024-02-24 DIAGNOSIS — I10 Essential (primary) hypertension: Secondary | ICD-10-CM | POA: Diagnosis not present

## 2024-02-24 DIAGNOSIS — F0390 Unspecified dementia without behavioral disturbance: Secondary | ICD-10-CM | POA: Diagnosis not present

## 2024-02-25 DIAGNOSIS — M272 Inflammatory conditions of jaws: Secondary | ICD-10-CM | POA: Diagnosis not present

## 2024-02-26 DIAGNOSIS — F331 Major depressive disorder, recurrent, moderate: Secondary | ICD-10-CM | POA: Diagnosis not present

## 2024-02-26 DIAGNOSIS — F01B4 Vascular dementia, moderate, with anxiety: Secondary | ICD-10-CM | POA: Diagnosis not present

## 2024-03-01 DIAGNOSIS — F0390 Unspecified dementia without behavioral disturbance: Secondary | ICD-10-CM | POA: Diagnosis not present

## 2024-03-01 DIAGNOSIS — L03211 Cellulitis of face: Secondary | ICD-10-CM | POA: Diagnosis not present

## 2024-03-01 DIAGNOSIS — I10 Essential (primary) hypertension: Secondary | ICD-10-CM | POA: Diagnosis not present

## 2024-03-01 DIAGNOSIS — F339 Major depressive disorder, recurrent, unspecified: Secondary | ICD-10-CM | POA: Diagnosis not present

## 2024-03-01 DIAGNOSIS — M272 Inflammatory conditions of jaws: Secondary | ICD-10-CM | POA: Diagnosis not present

## 2024-03-03 DIAGNOSIS — L98492 Non-pressure chronic ulcer of skin of other sites with fat layer exposed: Secondary | ICD-10-CM | POA: Diagnosis not present

## 2024-03-15 DIAGNOSIS — L03211 Cellulitis of face: Secondary | ICD-10-CM | POA: Diagnosis not present

## 2024-03-17 DIAGNOSIS — F0390 Unspecified dementia without behavioral disturbance: Secondary | ICD-10-CM | POA: Diagnosis not present

## 2024-03-17 DIAGNOSIS — M272 Inflammatory conditions of jaws: Secondary | ICD-10-CM | POA: Diagnosis not present

## 2024-03-17 DIAGNOSIS — I10 Essential (primary) hypertension: Secondary | ICD-10-CM | POA: Diagnosis not present

## 2024-03-22 DIAGNOSIS — L03211 Cellulitis of face: Secondary | ICD-10-CM | POA: Diagnosis not present

## 2024-03-23 DIAGNOSIS — F01B4 Vascular dementia, moderate, with anxiety: Secondary | ICD-10-CM | POA: Diagnosis not present

## 2024-03-23 DIAGNOSIS — F331 Major depressive disorder, recurrent, moderate: Secondary | ICD-10-CM | POA: Diagnosis not present

## 2024-03-29 DIAGNOSIS — L03211 Cellulitis of face: Secondary | ICD-10-CM | POA: Diagnosis not present

## 2024-03-31 DIAGNOSIS — L98492 Non-pressure chronic ulcer of skin of other sites with fat layer exposed: Secondary | ICD-10-CM | POA: Diagnosis not present

## 2024-04-05 DIAGNOSIS — L03211 Cellulitis of face: Secondary | ICD-10-CM | POA: Diagnosis not present

## 2024-04-13 DIAGNOSIS — H9190 Unspecified hearing loss, unspecified ear: Secondary | ICD-10-CM | POA: Diagnosis not present

## 2024-04-16 DIAGNOSIS — F331 Major depressive disorder, recurrent, moderate: Secondary | ICD-10-CM | POA: Diagnosis not present

## 2024-04-16 DIAGNOSIS — F01B4 Vascular dementia, moderate, with anxiety: Secondary | ICD-10-CM | POA: Diagnosis not present

## 2024-04-16 DIAGNOSIS — R634 Abnormal weight loss: Secondary | ICD-10-CM | POA: Diagnosis not present

## 2024-04-19 DIAGNOSIS — L03211 Cellulitis of face: Secondary | ICD-10-CM | POA: Diagnosis not present

## 2024-04-26 DIAGNOSIS — L03211 Cellulitis of face: Secondary | ICD-10-CM | POA: Diagnosis not present

## 2024-04-30 DIAGNOSIS — F0390 Unspecified dementia without behavioral disturbance: Secondary | ICD-10-CM | POA: Diagnosis not present

## 2024-04-30 DIAGNOSIS — E785 Hyperlipidemia, unspecified: Secondary | ICD-10-CM | POA: Diagnosis not present

## 2024-04-30 DIAGNOSIS — F339 Major depressive disorder, recurrent, unspecified: Secondary | ICD-10-CM | POA: Diagnosis not present

## 2024-04-30 DIAGNOSIS — I10 Essential (primary) hypertension: Secondary | ICD-10-CM | POA: Diagnosis not present

## 2024-04-30 DIAGNOSIS — M272 Inflammatory conditions of jaws: Secondary | ICD-10-CM | POA: Diagnosis not present

## 2024-05-03 DIAGNOSIS — L03211 Cellulitis of face: Secondary | ICD-10-CM | POA: Diagnosis not present

## 2024-05-06 DIAGNOSIS — M272 Inflammatory conditions of jaws: Secondary | ICD-10-CM | POA: Diagnosis not present

## 2024-05-06 DIAGNOSIS — F0390 Unspecified dementia without behavioral disturbance: Secondary | ICD-10-CM | POA: Diagnosis not present

## 2024-05-06 DIAGNOSIS — I10 Essential (primary) hypertension: Secondary | ICD-10-CM | POA: Diagnosis not present

## 2024-05-06 DIAGNOSIS — F339 Major depressive disorder, recurrent, unspecified: Secondary | ICD-10-CM | POA: Diagnosis not present

## 2024-05-10 DIAGNOSIS — L03211 Cellulitis of face: Secondary | ICD-10-CM | POA: Diagnosis not present

## 2024-05-12 DIAGNOSIS — L98492 Non-pressure chronic ulcer of skin of other sites with fat layer exposed: Secondary | ICD-10-CM | POA: Diagnosis not present

## 2024-05-17 DIAGNOSIS — L03211 Cellulitis of face: Secondary | ICD-10-CM | POA: Diagnosis not present

## 2024-05-20 DIAGNOSIS — F331 Major depressive disorder, recurrent, moderate: Secondary | ICD-10-CM | POA: Diagnosis not present

## 2024-05-20 DIAGNOSIS — F01B4 Vascular dementia, moderate, with anxiety: Secondary | ICD-10-CM | POA: Diagnosis not present

## 2024-06-01 DIAGNOSIS — I10 Essential (primary) hypertension: Secondary | ICD-10-CM | POA: Diagnosis not present

## 2024-06-04 DIAGNOSIS — S0180XA Unspecified open wound of other part of head, initial encounter: Secondary | ICD-10-CM | POA: Diagnosis not present

## 2024-06-04 DIAGNOSIS — M272 Inflammatory conditions of jaws: Secondary | ICD-10-CM | POA: Diagnosis not present

## 2024-06-07 DIAGNOSIS — L03211 Cellulitis of face: Secondary | ICD-10-CM | POA: Diagnosis not present

## 2024-06-09 DIAGNOSIS — L98492 Non-pressure chronic ulcer of skin of other sites with fat layer exposed: Secondary | ICD-10-CM | POA: Diagnosis not present

## 2024-06-14 DIAGNOSIS — L03211 Cellulitis of face: Secondary | ICD-10-CM | POA: Diagnosis not present

## 2024-06-17 DIAGNOSIS — F5101 Primary insomnia: Secondary | ICD-10-CM | POA: Diagnosis not present

## 2024-06-17 DIAGNOSIS — F331 Major depressive disorder, recurrent, moderate: Secondary | ICD-10-CM | POA: Diagnosis not present

## 2024-06-17 DIAGNOSIS — F01B4 Vascular dementia, moderate, with anxiety: Secondary | ICD-10-CM | POA: Diagnosis not present

## 2024-06-21 DIAGNOSIS — L03211 Cellulitis of face: Secondary | ICD-10-CM | POA: Diagnosis not present

## 2024-06-23 DIAGNOSIS — I10 Essential (primary) hypertension: Secondary | ICD-10-CM | POA: Diagnosis not present

## 2024-06-28 DIAGNOSIS — L03211 Cellulitis of face: Secondary | ICD-10-CM | POA: Diagnosis not present

## 2024-06-30 DIAGNOSIS — M272 Inflammatory conditions of jaws: Secondary | ICD-10-CM | POA: Diagnosis not present

## 2024-06-30 DIAGNOSIS — F0390 Unspecified dementia without behavioral disturbance: Secondary | ICD-10-CM | POA: Diagnosis not present

## 2024-06-30 DIAGNOSIS — F339 Major depressive disorder, recurrent, unspecified: Secondary | ICD-10-CM | POA: Diagnosis not present

## 2024-06-30 DIAGNOSIS — I10 Essential (primary) hypertension: Secondary | ICD-10-CM | POA: Diagnosis not present

## 2024-06-30 DIAGNOSIS — J449 Chronic obstructive pulmonary disease, unspecified: Secondary | ICD-10-CM | POA: Diagnosis not present

## 2024-07-05 DIAGNOSIS — L03211 Cellulitis of face: Secondary | ICD-10-CM | POA: Diagnosis not present

## 2024-07-07 DIAGNOSIS — L98492 Non-pressure chronic ulcer of skin of other sites with fat layer exposed: Secondary | ICD-10-CM | POA: Diagnosis not present

## 2024-07-15 DIAGNOSIS — F01B4 Vascular dementia, moderate, with anxiety: Secondary | ICD-10-CM | POA: Diagnosis not present

## 2024-07-15 DIAGNOSIS — F331 Major depressive disorder, recurrent, moderate: Secondary | ICD-10-CM | POA: Diagnosis not present

## 2024-07-19 DIAGNOSIS — L03211 Cellulitis of face: Secondary | ICD-10-CM | POA: Diagnosis not present

## 2024-07-20 DIAGNOSIS — M272 Inflammatory conditions of jaws: Secondary | ICD-10-CM | POA: Diagnosis not present

## 2024-07-21 DIAGNOSIS — L98492 Non-pressure chronic ulcer of skin of other sites with fat layer exposed: Secondary | ICD-10-CM | POA: Diagnosis not present

## 2024-08-02 DIAGNOSIS — L03211 Cellulitis of face: Secondary | ICD-10-CM | POA: Diagnosis not present

## 2024-08-06 DIAGNOSIS — L98492 Non-pressure chronic ulcer of skin of other sites with fat layer exposed: Secondary | ICD-10-CM | POA: Diagnosis not present

## 2024-08-09 DIAGNOSIS — L03211 Cellulitis of face: Secondary | ICD-10-CM | POA: Diagnosis not present

## 2024-08-11 DIAGNOSIS — I10 Essential (primary) hypertension: Secondary | ICD-10-CM | POA: Diagnosis not present

## 2024-08-11 DIAGNOSIS — F0390 Unspecified dementia without behavioral disturbance: Secondary | ICD-10-CM | POA: Diagnosis not present

## 2024-08-11 DIAGNOSIS — M272 Inflammatory conditions of jaws: Secondary | ICD-10-CM | POA: Diagnosis not present

## 2024-08-11 DIAGNOSIS — S0180XA Unspecified open wound of other part of head, initial encounter: Secondary | ICD-10-CM | POA: Diagnosis not present

## 2024-08-12 DIAGNOSIS — F01B4 Vascular dementia, moderate, with anxiety: Secondary | ICD-10-CM | POA: Diagnosis not present

## 2024-08-12 DIAGNOSIS — F330 Major depressive disorder, recurrent, mild: Secondary | ICD-10-CM | POA: Diagnosis not present

## 2024-08-16 DIAGNOSIS — L03211 Cellulitis of face: Secondary | ICD-10-CM | POA: Diagnosis not present

## 2024-08-23 DIAGNOSIS — L03211 Cellulitis of face: Secondary | ICD-10-CM | POA: Diagnosis not present

## 2024-08-25 DIAGNOSIS — S0180XA Unspecified open wound of other part of head, initial encounter: Secondary | ICD-10-CM | POA: Diagnosis not present

## 2024-09-03 DIAGNOSIS — L98492 Non-pressure chronic ulcer of skin of other sites with fat layer exposed: Secondary | ICD-10-CM | POA: Diagnosis not present

## 2024-09-06 DIAGNOSIS — L03211 Cellulitis of face: Secondary | ICD-10-CM | POA: Diagnosis not present

## 2024-09-09 DIAGNOSIS — F330 Major depressive disorder, recurrent, mild: Secondary | ICD-10-CM | POA: Diagnosis not present

## 2024-09-09 DIAGNOSIS — F01B4 Vascular dementia, moderate, with anxiety: Secondary | ICD-10-CM | POA: Diagnosis not present

## 2024-09-13 DIAGNOSIS — L03211 Cellulitis of face: Secondary | ICD-10-CM | POA: Diagnosis not present

## 2024-09-21 DIAGNOSIS — L03211 Cellulitis of face: Secondary | ICD-10-CM | POA: Diagnosis not present

## 2024-09-21 DIAGNOSIS — S0180XA Unspecified open wound of other part of head, initial encounter: Secondary | ICD-10-CM | POA: Diagnosis not present

## 2024-09-27 DIAGNOSIS — L03211 Cellulitis of face: Secondary | ICD-10-CM | POA: Diagnosis not present

## 2024-10-04 DIAGNOSIS — L03211 Cellulitis of face: Secondary | ICD-10-CM | POA: Diagnosis not present

## 2024-10-07 DIAGNOSIS — F01B4 Vascular dementia, moderate, with anxiety: Secondary | ICD-10-CM | POA: Diagnosis not present

## 2024-10-07 DIAGNOSIS — F330 Major depressive disorder, recurrent, mild: Secondary | ICD-10-CM | POA: Diagnosis not present

## 2024-10-11 DIAGNOSIS — L03211 Cellulitis of face: Secondary | ICD-10-CM | POA: Diagnosis not present

## 2024-10-18 DIAGNOSIS — L03211 Cellulitis of face: Secondary | ICD-10-CM | POA: Diagnosis not present

## 2024-10-25 DIAGNOSIS — L03211 Cellulitis of face: Secondary | ICD-10-CM | POA: Diagnosis not present

## 2024-10-26 DIAGNOSIS — S0180XA Unspecified open wound of other part of head, initial encounter: Secondary | ICD-10-CM | POA: Diagnosis not present

## 2024-10-29 DIAGNOSIS — J449 Chronic obstructive pulmonary disease, unspecified: Secondary | ICD-10-CM | POA: Diagnosis not present

## 2024-10-29 DIAGNOSIS — F0390 Unspecified dementia without behavioral disturbance: Secondary | ICD-10-CM | POA: Diagnosis not present

## 2024-11-01 DIAGNOSIS — L988 Other specified disorders of the skin and subcutaneous tissue: Secondary | ICD-10-CM | POA: Diagnosis not present

## 2024-11-04 DIAGNOSIS — F330 Major depressive disorder, recurrent, mild: Secondary | ICD-10-CM | POA: Diagnosis not present

## 2024-11-04 DIAGNOSIS — F01B4 Vascular dementia, moderate, with anxiety: Secondary | ICD-10-CM | POA: Diagnosis not present

## 2024-11-08 DIAGNOSIS — L988 Other specified disorders of the skin and subcutaneous tissue: Secondary | ICD-10-CM | POA: Diagnosis not present

## 2024-11-15 DIAGNOSIS — L988 Other specified disorders of the skin and subcutaneous tissue: Secondary | ICD-10-CM | POA: Diagnosis not present

## 2024-11-16 DIAGNOSIS — E559 Vitamin D deficiency, unspecified: Secondary | ICD-10-CM | POA: Diagnosis not present

## 2024-11-16 DIAGNOSIS — F0390 Unspecified dementia without behavioral disturbance: Secondary | ICD-10-CM | POA: Diagnosis not present

## 2024-11-16 DIAGNOSIS — E785 Hyperlipidemia, unspecified: Secondary | ICD-10-CM | POA: Diagnosis not present

## 2024-11-16 DIAGNOSIS — I10 Essential (primary) hypertension: Secondary | ICD-10-CM | POA: Diagnosis not present

## 2024-11-22 DIAGNOSIS — L988 Other specified disorders of the skin and subcutaneous tissue: Secondary | ICD-10-CM | POA: Diagnosis not present

## 2024-12-02 DIAGNOSIS — F01B4 Vascular dementia, moderate, with anxiety: Secondary | ICD-10-CM | POA: Diagnosis not present

## 2024-12-02 DIAGNOSIS — F330 Major depressive disorder, recurrent, mild: Secondary | ICD-10-CM | POA: Diagnosis not present
